# Patient Record
Sex: Female | Born: 1940
Health system: Southern US, Community
[De-identification: ages and names within clinical notes are randomized; demographics above are authoritative.]

## PROBLEM LIST (undated history)

## (undated) DIAGNOSIS — N3010 Interstitial cystitis (chronic) without hematuria: Secondary | ICD-10-CM

## (undated) DIAGNOSIS — D649 Anemia, unspecified: Secondary | ICD-10-CM

## (undated) DIAGNOSIS — F419 Anxiety disorder, unspecified: Secondary | ICD-10-CM

## (undated) DIAGNOSIS — M1990 Unspecified osteoarthritis, unspecified site: Secondary | ICD-10-CM

## (undated) DIAGNOSIS — K5792 Diverticulitis of intestine, part unspecified, without perforation or abscess without bleeding: Secondary | ICD-10-CM

## (undated) DIAGNOSIS — M8580 Other specified disorders of bone density and structure, unspecified site: Secondary | ICD-10-CM

## (undated) DIAGNOSIS — M545 Low back pain: Secondary | ICD-10-CM

## (undated) DIAGNOSIS — R112 Nausea with vomiting, unspecified: Secondary | ICD-10-CM

## (undated) DIAGNOSIS — N189 Chronic kidney disease, unspecified: Secondary | ICD-10-CM

## (undated) DIAGNOSIS — R319 Hematuria, unspecified: Secondary | ICD-10-CM

## (undated) DIAGNOSIS — M719 Bursopathy, unspecified: Secondary | ICD-10-CM

## (undated) DIAGNOSIS — I6529 Occlusion and stenosis of unspecified carotid artery: Secondary | ICD-10-CM

## (undated) DIAGNOSIS — G43109 Migraine with aura, not intractable, without status migrainosus: Secondary | ICD-10-CM

## (undated) DIAGNOSIS — I10 Essential (primary) hypertension: Secondary | ICD-10-CM

## (undated) DIAGNOSIS — Z9889 Other specified postprocedural states: Secondary | ICD-10-CM

## (undated) HISTORY — DX: Low back pain: M54.5

## (undated) HISTORY — DX: Chronic kidney disease, unspecified: N18.9

## (undated) HISTORY — DX: Diverticulitis of intestine, part unspecified, without perforation or abscess without bleeding: K57.92

## (undated) HISTORY — DX: Occlusion and stenosis of unspecified carotid artery: I65.29

## (undated) HISTORY — DX: Anemia, unspecified: D64.9

## (undated) HISTORY — DX: Migraine with aura, not intractable, without status migrainosus: G43.109

## (undated) HISTORY — DX: Essential (primary) hypertension: I10

## (undated) HISTORY — DX: Hematuria, unspecified: R31.9

## (undated) HISTORY — DX: Interstitial cystitis (chronic) without hematuria: N30.10

## (undated) HISTORY — DX: Bursopathy, unspecified: M71.9

## (undated) HISTORY — DX: Anxiety disorder, unspecified: F41.9

## (undated) HISTORY — DX: Unspecified osteoarthritis, unspecified site: M19.90

## (undated) HISTORY — DX: Other specified disorders of bone density and structure, unspecified site: M85.80

---
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
## 1998-03-09 ENCOUNTER — Ambulatory Visit (HOSPITAL_COMMUNITY): Admission: RE | Admit: 1998-03-09 | Discharge: 1998-03-09 | Payer: Self-pay | Admitting: Obstetrics and Gynecology

## 1998-08-17 ENCOUNTER — Other Ambulatory Visit: Admission: RE | Admit: 1998-08-17 | Discharge: 1998-08-17 | Payer: Self-pay | Admitting: Obstetrics and Gynecology

## 1999-04-29 ENCOUNTER — Ambulatory Visit (HOSPITAL_COMMUNITY): Admission: RE | Admit: 1999-04-29 | Discharge: 1999-04-29 | Payer: Self-pay | Admitting: Obstetrics and Gynecology

## 1999-04-29 ENCOUNTER — Encounter: Payer: Self-pay | Admitting: Obstetrics and Gynecology

## 1999-08-30 ENCOUNTER — Other Ambulatory Visit: Admission: RE | Admit: 1999-08-30 | Discharge: 1999-08-30 | Payer: Self-pay | Admitting: Obstetrics and Gynecology

## 2000-10-09 ENCOUNTER — Other Ambulatory Visit: Admission: RE | Admit: 2000-10-09 | Discharge: 2000-10-09 | Payer: Self-pay | Admitting: Obstetrics and Gynecology

## 2000-10-16 ENCOUNTER — Encounter: Payer: Self-pay | Admitting: Obstetrics and Gynecology

## 2000-10-16 ENCOUNTER — Ambulatory Visit (HOSPITAL_COMMUNITY): Admission: RE | Admit: 2000-10-16 | Discharge: 2000-10-16 | Payer: Self-pay | Admitting: Obstetrics and Gynecology

## 2001-04-30 ENCOUNTER — Encounter: Payer: Self-pay | Admitting: Obstetrics and Gynecology

## 2001-04-30 ENCOUNTER — Encounter: Admission: RE | Admit: 2001-04-30 | Discharge: 2001-04-30 | Payer: Self-pay | Admitting: Obstetrics and Gynecology

## 2001-10-15 ENCOUNTER — Other Ambulatory Visit: Admission: RE | Admit: 2001-10-15 | Discharge: 2001-10-15 | Payer: Self-pay | Admitting: Obstetrics and Gynecology

## 2001-10-18 ENCOUNTER — Other Ambulatory Visit: Admission: RE | Admit: 2001-10-18 | Discharge: 2001-10-18 | Payer: Self-pay | Admitting: Obstetrics and Gynecology

## 2001-11-24 ENCOUNTER — Ambulatory Visit (HOSPITAL_COMMUNITY): Admission: RE | Admit: 2001-11-24 | Discharge: 2001-11-24 | Payer: Self-pay | Admitting: Obstetrics and Gynecology

## 2001-11-24 ENCOUNTER — Encounter: Payer: Self-pay | Admitting: Obstetrics and Gynecology

## 2002-10-27 ENCOUNTER — Other Ambulatory Visit: Admission: RE | Admit: 2002-10-27 | Discharge: 2002-10-27 | Payer: Self-pay | Admitting: Obstetrics and Gynecology

## 2003-02-06 ENCOUNTER — Encounter: Payer: Self-pay | Admitting: Obstetrics and Gynecology

## 2003-02-06 ENCOUNTER — Ambulatory Visit (HOSPITAL_COMMUNITY): Admission: RE | Admit: 2003-02-06 | Discharge: 2003-02-06 | Payer: Self-pay | Admitting: Obstetrics and Gynecology

## 2003-07-19 ENCOUNTER — Encounter: Admission: RE | Admit: 2003-07-19 | Discharge: 2003-07-19 | Payer: Self-pay | Admitting: Obstetrics and Gynecology

## 2003-10-30 ENCOUNTER — Other Ambulatory Visit: Admission: RE | Admit: 2003-10-30 | Discharge: 2003-10-30 | Payer: Self-pay | Admitting: Obstetrics and Gynecology

## 2004-02-22 ENCOUNTER — Ambulatory Visit (HOSPITAL_COMMUNITY): Admission: RE | Admit: 2004-02-22 | Discharge: 2004-02-22 | Payer: Self-pay | Admitting: Obstetrics and Gynecology

## 2004-11-05 ENCOUNTER — Other Ambulatory Visit: Admission: RE | Admit: 2004-11-05 | Discharge: 2004-11-05 | Payer: Self-pay | Admitting: *Deleted

## 2005-03-12 ENCOUNTER — Ambulatory Visit (HOSPITAL_COMMUNITY): Admission: RE | Admit: 2005-03-12 | Discharge: 2005-03-12 | Payer: Self-pay | Admitting: Obstetrics and Gynecology

## 2005-03-14 ENCOUNTER — Encounter: Admission: RE | Admit: 2005-03-14 | Discharge: 2005-03-14 | Payer: Self-pay | Admitting: Family Medicine

## 2005-11-20 ENCOUNTER — Other Ambulatory Visit: Admission: RE | Admit: 2005-11-20 | Discharge: 2005-11-20 | Payer: Self-pay | Admitting: Obstetrics & Gynecology

## 2006-04-08 ENCOUNTER — Ambulatory Visit (HOSPITAL_COMMUNITY): Admission: RE | Admit: 2006-04-08 | Discharge: 2006-04-08 | Payer: Self-pay | Admitting: Obstetrics & Gynecology

## 2006-12-25 ENCOUNTER — Other Ambulatory Visit: Admission: RE | Admit: 2006-12-25 | Discharge: 2006-12-25 | Payer: Self-pay | Admitting: Obstetrics & Gynecology

## 2007-04-13 ENCOUNTER — Ambulatory Visit (HOSPITAL_COMMUNITY): Admission: RE | Admit: 2007-04-13 | Discharge: 2007-04-13 | Payer: Self-pay | Admitting: Obstetrics & Gynecology

## 2008-04-12 ENCOUNTER — Other Ambulatory Visit: Admission: RE | Admit: 2008-04-12 | Discharge: 2008-04-12 | Payer: Self-pay | Admitting: Obstetrics & Gynecology

## 2008-04-21 ENCOUNTER — Ambulatory Visit (HOSPITAL_COMMUNITY): Admission: RE | Admit: 2008-04-21 | Discharge: 2008-04-21 | Payer: Self-pay | Admitting: Obstetrics & Gynecology

## 2009-05-03 ENCOUNTER — Ambulatory Visit (HOSPITAL_COMMUNITY): Admission: RE | Admit: 2009-05-03 | Discharge: 2009-05-03 | Payer: Self-pay | Admitting: Obstetrics & Gynecology

## 2010-05-09 ENCOUNTER — Ambulatory Visit (HOSPITAL_COMMUNITY): Admission: RE | Admit: 2010-05-09 | Discharge: 2010-05-09 | Payer: Self-pay | Admitting: Obstetrics & Gynecology

## 2010-09-01 ENCOUNTER — Encounter: Payer: Self-pay | Admitting: Obstetrics & Gynecology

## 2011-05-23 ENCOUNTER — Other Ambulatory Visit: Payer: Self-pay | Admitting: Obstetrics & Gynecology

## 2011-05-23 DIAGNOSIS — Z1231 Encounter for screening mammogram for malignant neoplasm of breast: Secondary | ICD-10-CM

## 2011-06-12 ENCOUNTER — Ambulatory Visit (HOSPITAL_COMMUNITY)
Admission: RE | Admit: 2011-06-12 | Discharge: 2011-06-12 | Disposition: A | Discharge status: Home / Self Care | Payer: Medicare Other | Source: Ambulatory Visit | Attending: Obstetrics & Gynecology | Admitting: Obstetrics & Gynecology

## 2011-06-12 DIAGNOSIS — Z1231 Encounter for screening mammogram for malignant neoplasm of breast: Secondary | ICD-10-CM | POA: Insufficient documentation

## 2011-11-05 DIAGNOSIS — F411 Generalized anxiety disorder: Secondary | ICD-10-CM | POA: Diagnosis not present

## 2011-11-05 DIAGNOSIS — J309 Allergic rhinitis, unspecified: Secondary | ICD-10-CM | POA: Diagnosis not present

## 2011-11-05 DIAGNOSIS — I10 Essential (primary) hypertension: Secondary | ICD-10-CM | POA: Diagnosis not present

## 2011-11-05 DIAGNOSIS — M159 Polyosteoarthritis, unspecified: Secondary | ICD-10-CM | POA: Diagnosis not present

## 2011-11-05 DIAGNOSIS — M899 Disorder of bone, unspecified: Secondary | ICD-10-CM | POA: Diagnosis not present

## 2011-11-05 DIAGNOSIS — M949 Disorder of cartilage, unspecified: Secondary | ICD-10-CM | POA: Diagnosis not present

## 2012-01-28 DIAGNOSIS — H40059 Ocular hypertension, unspecified eye: Secondary | ICD-10-CM | POA: Diagnosis not present

## 2012-03-17 DIAGNOSIS — B079 Viral wart, unspecified: Secondary | ICD-10-CM | POA: Diagnosis not present

## 2012-04-30 DIAGNOSIS — H9209 Otalgia, unspecified ear: Secondary | ICD-10-CM | POA: Diagnosis not present

## 2012-05-07 DIAGNOSIS — F411 Generalized anxiety disorder: Secondary | ICD-10-CM | POA: Diagnosis not present

## 2012-05-07 DIAGNOSIS — M899 Disorder of bone, unspecified: Secondary | ICD-10-CM | POA: Diagnosis not present

## 2012-05-07 DIAGNOSIS — Z23 Encounter for immunization: Secondary | ICD-10-CM | POA: Diagnosis not present

## 2012-05-07 DIAGNOSIS — M159 Polyosteoarthritis, unspecified: Secondary | ICD-10-CM | POA: Diagnosis not present

## 2012-05-07 DIAGNOSIS — I10 Essential (primary) hypertension: Secondary | ICD-10-CM | POA: Diagnosis not present

## 2012-05-07 DIAGNOSIS — J309 Allergic rhinitis, unspecified: Secondary | ICD-10-CM | POA: Diagnosis not present

## 2012-05-07 DIAGNOSIS — M949 Disorder of cartilage, unspecified: Secondary | ICD-10-CM | POA: Diagnosis not present

## 2012-05-07 DIAGNOSIS — R000 Tachycardia, unspecified: Secondary | ICD-10-CM | POA: Diagnosis not present

## 2012-05-07 DIAGNOSIS — E559 Vitamin D deficiency, unspecified: Secondary | ICD-10-CM | POA: Diagnosis not present

## 2012-05-07 DIAGNOSIS — Z1331 Encounter for screening for depression: Secondary | ICD-10-CM | POA: Diagnosis not present

## 2012-06-29 ENCOUNTER — Other Ambulatory Visit: Payer: Self-pay | Admitting: Obstetrics & Gynecology

## 2012-06-29 DIAGNOSIS — Z1231 Encounter for screening mammogram for malignant neoplasm of breast: Secondary | ICD-10-CM

## 2012-06-29 DIAGNOSIS — M8580 Other specified disorders of bone density and structure, unspecified site: Secondary | ICD-10-CM

## 2012-07-14 DIAGNOSIS — Z124 Encounter for screening for malignant neoplasm of cervix: Secondary | ICD-10-CM | POA: Diagnosis not present

## 2012-07-14 DIAGNOSIS — Z01419 Encounter for gynecological examination (general) (routine) without abnormal findings: Secondary | ICD-10-CM | POA: Diagnosis not present

## 2012-07-21 ENCOUNTER — Ambulatory Visit (HOSPITAL_COMMUNITY)
Admission: RE | Admit: 2012-07-21 | Discharge: 2012-07-21 | Disposition: A | Discharge status: Home / Self Care | Payer: Medicare Other | Source: Ambulatory Visit | Attending: Obstetrics & Gynecology | Admitting: Obstetrics & Gynecology

## 2012-07-21 DIAGNOSIS — Z13820 Encounter for screening for osteoporosis: Secondary | ICD-10-CM | POA: Diagnosis not present

## 2012-07-21 DIAGNOSIS — Z1231 Encounter for screening mammogram for malignant neoplasm of breast: Secondary | ICD-10-CM | POA: Insufficient documentation

## 2012-07-21 DIAGNOSIS — M899 Disorder of bone, unspecified: Secondary | ICD-10-CM | POA: Diagnosis not present

## 2012-07-21 DIAGNOSIS — Z780 Asymptomatic menopausal state: Secondary | ICD-10-CM | POA: Diagnosis not present

## 2012-07-21 DIAGNOSIS — M949 Disorder of cartilage, unspecified: Secondary | ICD-10-CM | POA: Diagnosis not present

## 2012-07-21 DIAGNOSIS — M8580 Other specified disorders of bone density and structure, unspecified site: Secondary | ICD-10-CM

## 2012-08-13 DIAGNOSIS — M899 Disorder of bone, unspecified: Secondary | ICD-10-CM | POA: Diagnosis not present

## 2012-11-04 DIAGNOSIS — J309 Allergic rhinitis, unspecified: Secondary | ICD-10-CM | POA: Diagnosis not present

## 2012-11-04 DIAGNOSIS — M159 Polyosteoarthritis, unspecified: Secondary | ICD-10-CM | POA: Diagnosis not present

## 2012-11-04 DIAGNOSIS — R7301 Impaired fasting glucose: Secondary | ICD-10-CM | POA: Diagnosis not present

## 2012-11-04 DIAGNOSIS — I10 Essential (primary) hypertension: Secondary | ICD-10-CM | POA: Diagnosis not present

## 2012-11-04 DIAGNOSIS — R0989 Other specified symptoms and signs involving the circulatory and respiratory systems: Secondary | ICD-10-CM | POA: Diagnosis not present

## 2012-11-04 DIAGNOSIS — F411 Generalized anxiety disorder: Secondary | ICD-10-CM | POA: Diagnosis not present

## 2012-11-04 DIAGNOSIS — E559 Vitamin D deficiency, unspecified: Secondary | ICD-10-CM | POA: Diagnosis not present

## 2012-11-04 DIAGNOSIS — M899 Disorder of bone, unspecified: Secondary | ICD-10-CM | POA: Diagnosis not present

## 2012-11-04 DIAGNOSIS — M949 Disorder of cartilage, unspecified: Secondary | ICD-10-CM | POA: Diagnosis not present

## 2012-11-05 DIAGNOSIS — R0989 Other specified symptoms and signs involving the circulatory and respiratory systems: Secondary | ICD-10-CM | POA: Diagnosis not present

## 2012-11-09 DIAGNOSIS — I6529 Occlusion and stenosis of unspecified carotid artery: Secondary | ICD-10-CM

## 2012-11-09 HISTORY — DX: Occlusion and stenosis of unspecified carotid artery: I65.29

## 2012-11-11 ENCOUNTER — Other Ambulatory Visit: Payer: Self-pay

## 2012-11-24 ENCOUNTER — Encounter: Payer: Self-pay | Admitting: Vascular Surgery

## 2012-11-29 ENCOUNTER — Encounter: Payer: Self-pay | Admitting: Vascular Surgery

## 2012-11-30 ENCOUNTER — Other Ambulatory Visit (INDEPENDENT_AMBULATORY_CARE_PROVIDER_SITE_OTHER): Payer: BC Managed Care – PPO | Admitting: *Deleted

## 2012-11-30 ENCOUNTER — Encounter: Payer: Self-pay | Admitting: Vascular Surgery

## 2012-11-30 ENCOUNTER — Ambulatory Visit (INDEPENDENT_AMBULATORY_CARE_PROVIDER_SITE_OTHER): Payer: BC Managed Care – PPO | Admitting: Vascular Surgery

## 2012-11-30 ENCOUNTER — Other Ambulatory Visit: Payer: Self-pay | Admitting: *Deleted

## 2012-11-30 DIAGNOSIS — I6529 Occlusion and stenosis of unspecified carotid artery: Secondary | ICD-10-CM

## 2012-11-30 MED ORDER — ATORVASTATIN CALCIUM 10 MG PO TABS: 10.0000 mg | ORAL_TABLET | Freq: Every day | ORAL | Status: DC

## 2012-11-30 NOTE — Progress Notes (Signed)
Subjective:     Patient ID: Amber Barry, female   DOB: 12/06/1940, 71 y.o.   MRN: [id number]  HPI this 71-year-old female was referred by Dr. Robert Ehinger for evaluation of severe right ICA stenosis. Patient denies any history of stroke. She denies any lateralizing weakness, amaurosis fugax, aphasia, diplopia, blurred vision, or syncope. Patient was found to have an 80% right ICA stenosis after a carotid bruit was heard on physical exam. He does not take daily aspirin or antilipid drugs.  Past Medical History  Diagnosis Date  . Carotid artery occlusion April 2014    Left Bruit  . Hypertension   . Arthritis     Osteoarthritis  . Anxiety   . Osteopenia   . Anemia   . Interstitial cystitis   . CKD (chronic kidney disease)     Stage III    History  Substance Use Topics  . Smoking status: Never Smoker   . Smokeless tobacco: Never Used  . Alcohol Use: No    Family History  Problem Relation Age of Onset  . COPD Mother     Respiratory Disease  . Stroke Father   . Hypertension Father     Allergies  Allergen Reactions  . Benicar Hct (Olmesartan Medoxomil-Hctz) Swelling    Facial swelling  . Buspar (Buspirone)     Facial Dysesthesia  . Norvasc (Amlodipine Besylate) Swelling  . Zoloft (Sertraline Hcl) Anxiety    Increased anxiety  . Amitriptyline Hcl Anxiety    And Paresthesias    Current outpatient prescriptions:ALPRAZolam (XANAX) 0.25 MG tablet, Take 0.25 mg by mouth at bedtime as needed for sleep., Disp: , Rfl: ;  calcium carbonate (OS-CAL) 600 MG TABS, Take 600 mg by mouth 2 (two) times daily with a meal., Disp: , Rfl: ;  celecoxib (CELEBREX) 200 MG capsule, Take 200 mg by mouth 2 (two) times daily., Disp: , Rfl: ;  Cholecalciferol (VITAMIN D) 2000 UNITS CAPS, Take by mouth., Disp: , Rfl:  moexipril-hydrochlorothiazide (UNIRETIC) 15-25 MG per tablet, Take 1 tablet by mouth daily., Disp: , Rfl: ;  pseudoephedrine (SUDAFED) 30 MG tablet, Take 30 mg by mouth every 4 (four)  hours as needed for congestion., Disp: , Rfl: ;  atorvastatin (LIPITOR) 10 MG tablet, Take 1 tablet (10 mg total) by mouth daily., Disp: 30 tablet, Rfl: 0;  Azelastine HCl (ASTEPRO) 0.15 % SOLN, Place 2 sprays into the nose., Disp: , Rfl:   BP 157/84  Pulse 84  Resp 16  Ht 4' 11" (1.499 m)  Wt 125 lb (56.7 kg)  BMI 25.23 kg/m2  Body mass index is 25.23 kg/(m^2).          Review of Systems denies chest pain, dyspnea on exertion, PND, orthopnea, hemoptysis, claudication.    Objective:   Physical Exam blood pressure 157/84 heart rate 84 respirations 16 Gen.-alert and oriented x3 in no apparent distress HEENT normal for age Lungs no rhonchi or wheezing Cardiovascular regular rhythm no murmurs carotid pulses 3+ palpable , right carotid bruit  Abdomen soft nontender no palpable masses Musculoskeletal free of  major deformities Skin clear -no rashes Neurologic normal Lower extremities 3+ femoral and dorsalis pedis pulses palpable bilaterally with no edema  Today I ordered a carotid duplex exam which I reviewed and compared to previous study. I agreed she does have an 80% right ICA stenosis. The left internal carotid has minimal stenosis.      Assessment:     80% right ICA stenosis-asymptomatic Discussed this with patient   and her daughter and son-in-law including risks and benefits of surgery Also discussed the need to begin antilipid drugs-Lipitor    Plan:     #1 right carotid endarterectomy will be scheduled in the near future Patient will call and arrange this sometime in the near future Suggested that she start daily aspirin 81 mg      

## 2012-12-01 ENCOUNTER — Encounter: Payer: Self-pay | Admitting: *Deleted

## 2012-12-01 ENCOUNTER — Other Ambulatory Visit: Payer: Self-pay | Admitting: *Deleted

## 2012-12-07 ENCOUNTER — Encounter (HOSPITAL_COMMUNITY): Payer: Self-pay | Admitting: Pharmacist

## 2012-12-10 ENCOUNTER — Encounter (HOSPITAL_COMMUNITY): Payer: Self-pay

## 2012-12-10 ENCOUNTER — Encounter (HOSPITAL_COMMUNITY)
Admission: RE | Admit: 2012-12-10 | Discharge: 2012-12-10 | Disposition: A | Discharge status: Home / Self Care | Payer: Medicare Other | Source: Ambulatory Visit | Attending: Anesthesiology | Admitting: Anesthesiology

## 2012-12-10 ENCOUNTER — Encounter (HOSPITAL_COMMUNITY)
Admission: RE | Admit: 2012-12-10 | Discharge: 2012-12-10 | Disposition: A | Discharge status: Home / Self Care | Payer: Medicare Other | Source: Ambulatory Visit | Attending: Vascular Surgery | Admitting: Vascular Surgery

## 2012-12-10 DIAGNOSIS — N183 Chronic kidney disease, stage 3 unspecified: Secondary | ICD-10-CM | POA: Insufficient documentation

## 2012-12-10 DIAGNOSIS — I10 Essential (primary) hypertension: Secondary | ICD-10-CM | POA: Diagnosis not present

## 2012-12-10 DIAGNOSIS — F411 Generalized anxiety disorder: Secondary | ICD-10-CM | POA: Insufficient documentation

## 2012-12-10 DIAGNOSIS — D649 Anemia, unspecified: Secondary | ICD-10-CM | POA: Diagnosis not present

## 2012-12-10 DIAGNOSIS — Z01810 Encounter for preprocedural cardiovascular examination: Secondary | ICD-10-CM | POA: Insufficient documentation

## 2012-12-10 DIAGNOSIS — I129 Hypertensive chronic kidney disease with stage 1 through stage 4 chronic kidney disease, or unspecified chronic kidney disease: Secondary | ICD-10-CM | POA: Insufficient documentation

## 2012-12-10 DIAGNOSIS — Z01812 Encounter for preprocedural laboratory examination: Secondary | ICD-10-CM | POA: Insufficient documentation

## 2012-12-10 DIAGNOSIS — I4510 Unspecified right bundle-branch block: Secondary | ICD-10-CM | POA: Insufficient documentation

## 2012-12-10 DIAGNOSIS — M129 Arthropathy, unspecified: Secondary | ICD-10-CM | POA: Insufficient documentation

## 2012-12-10 DIAGNOSIS — Z01818 Encounter for other preprocedural examination: Secondary | ICD-10-CM | POA: Insufficient documentation

## 2012-12-10 HISTORY — DX: Nausea with vomiting, unspecified: R11.2

## 2012-12-10 HISTORY — DX: Other specified postprocedural states: Z98.890

## 2012-12-10 LAB — URINALYSIS, ROUTINE W REFLEX MICROSCOPIC
Bilirubin Urine: NEGATIVE
Glucose, UA: NEGATIVE mg/dL
Ketones, ur: NEGATIVE mg/dL
Protein, ur: NEGATIVE mg/dL

## 2012-12-10 LAB — CBC
MCH: 29.7 pg (ref 26.0–34.0)
MCHC: 34.6 g/dL (ref 30.0–36.0)
Platelets: 257 10*3/uL (ref 150–400)
RBC: 4.37 MIL/uL (ref 3.87–5.11)

## 2012-12-10 LAB — PREPARE RBC (CROSSMATCH)

## 2012-12-10 LAB — COMPREHENSIVE METABOLIC PANEL
ALT: 12 U/L (ref 0–35)
AST: 19 U/L (ref 0–37)
Albumin: 4.1 g/dL (ref 3.5–5.2)
Calcium: 10.2 mg/dL (ref 8.4–10.5)
Sodium: 137 mEq/L (ref 135–145)
Total Protein: 8.1 g/dL (ref 6.0–8.3)

## 2012-12-10 LAB — URINE MICROSCOPIC-ADD ON

## 2012-12-10 LAB — SURGICAL PCR SCREEN: Staphylococcus aureus: NEGATIVE

## 2012-12-10 NOTE — Pre-Procedure Instructions (Signed)
Amber Barry  12/10/2012   Your procedure is scheduled on:  12-22-2012  Wednesday   Report to [facility] Stay Center at 6:30 AM.  Call this number if you have problems the morning of surgery: [phone]   Remember:   Do not eat food or drink liquids after midnight.    Take these medicines the morning of surgery with A SIP OF WATER: xanax if needed,aspirin,        Do not wear jewelry, make-up or nail polish.  Do not wear lotions, powders, or perfumes. You may wear deodorant.  Do not shave 48 hours prior to surgery.   Do not bring valuables to the hospital.  Contacts, dentures or bridgework may not be worn into surgery.  Leave suitcase in the car. After surgery it may be brought to your room.   For patients admitted to the hospital, checkout time is 11:00 AM the day of discharge.   Patients discharged the day of surgery will not be allowed to drive home.     Special Instructions: Shower using CHG 2 nights before surgery and the night before surgery.  If you shower the day of surgery use CHG.  Use special wash - you have one bottle of CHG for all showers.  You should use approximately 1/3 of the bottle for each shower.   Please read over the following fact sheets that you were given: Pain Booklet, Coughing and Deep Breathing, Blood Transfusion Information and Surgical Site Infection Prevention

## 2012-12-13 NOTE — Progress Notes (Signed)
Anesthesia Chart Review:  Patient is a 72 year old female scheduled for right CEA by Dr. Hart Rochester on 12/22/12.  Recent carotid duplex showed 80% RICAS and minimal stenosis on the left.  History includes non-smoker, HTN, CKD stage III, anemia, arthritis, anxiety, post-operative N/V.  PCP is Dr. Blair Heys.    EKG on 12/10/12 showed NSR, right BBB.  CXR on 12/10/12 showed no acute cardiopulmonary process.  Preoperative labs noted. Cr 1.11.    Anticipate she can proceed as planned.  She will be evaluated by her assigned anesthesiologist on the day of surgery.  Velna Ochs [facility] Short Stay Center/Anesthesiology Phone [phone] 12/13/2012 2:52 PM

## 2012-12-21 MED ORDER — DEXTROSE 5 % IV SOLN
1.5000 g | INTRAVENOUS | Status: AC
  Administered 2012-12-22: 1.5 g via INTRAVENOUS
  Filled 2012-12-21: qty 1.5

## 2012-12-22 ENCOUNTER — Inpatient Hospital Stay (HOSPITAL_COMMUNITY): Payer: Medicare Other | Admitting: Anesthesiology

## 2012-12-22 ENCOUNTER — Encounter (HOSPITAL_COMMUNITY): Payer: Self-pay | Admitting: Vascular Surgery

## 2012-12-22 ENCOUNTER — Encounter (HOSPITAL_COMMUNITY)
Admission: RE | Disposition: A | Discharge status: Home / Self Care | Payer: Self-pay | Source: Ambulatory Visit | Attending: Vascular Surgery

## 2012-12-22 ENCOUNTER — Encounter (HOSPITAL_COMMUNITY): Payer: Self-pay | Admitting: Anesthesiology

## 2012-12-22 ENCOUNTER — Inpatient Hospital Stay (HOSPITAL_COMMUNITY)
Admission: RE | Admit: 2012-12-22 | Discharge: 2012-12-23 | DRG: 038 | Disposition: A | Discharge status: Home / Self Care | Payer: Medicare Other | Source: Ambulatory Visit | Attending: Vascular Surgery | Admitting: Vascular Surgery

## 2012-12-22 DIAGNOSIS — F411 Generalized anxiety disorder: Secondary | ICD-10-CM | POA: Diagnosis present

## 2012-12-22 DIAGNOSIS — N183 Chronic kidney disease, stage 3 unspecified: Secondary | ICD-10-CM | POA: Diagnosis not present

## 2012-12-22 DIAGNOSIS — I6529 Occlusion and stenosis of unspecified carotid artery: Secondary | ICD-10-CM | POA: Diagnosis not present

## 2012-12-22 DIAGNOSIS — M1990 Unspecified osteoarthritis, unspecified site: Secondary | ICD-10-CM | POA: Diagnosis present

## 2012-12-22 DIAGNOSIS — D62 Acute posthemorrhagic anemia: Secondary | ICD-10-CM | POA: Diagnosis not present

## 2012-12-22 DIAGNOSIS — I129 Hypertensive chronic kidney disease with stage 1 through stage 4 chronic kidney disease, or unspecified chronic kidney disease: Secondary | ICD-10-CM | POA: Diagnosis present

## 2012-12-22 DIAGNOSIS — N189 Chronic kidney disease, unspecified: Secondary | ICD-10-CM | POA: Diagnosis not present

## 2012-12-22 DIAGNOSIS — I10 Essential (primary) hypertension: Secondary | ICD-10-CM | POA: Diagnosis not present

## 2012-12-22 HISTORY — PX: ENDARTERECTOMY: SHX5162

## 2012-12-22 LAB — CBC
Hemoglobin: 11.1 g/dL — ABNORMAL LOW (ref 12.0–15.0)
MCH: 29.7 pg (ref 26.0–34.0)
MCHC: 34 g/dL (ref 30.0–36.0)
Platelets: 176 10*3/uL (ref 150–400)
RBC: 3.74 MIL/uL — ABNORMAL LOW (ref 3.87–5.11)

## 2012-12-22 LAB — CREATININE, SERUM
Creatinine, Ser: 1.04 mg/dL (ref 0.50–1.10)
GFR calc non Af Amer: 52 mL/min — ABNORMAL LOW (ref >90)

## 2012-12-22 SURGERY — ENDARTERECTOMY, CAROTID
Anesthesia: General | Site: Neck | Laterality: Right | Wound class: Clean

## 2012-12-22 MED ORDER — VITAMIN D3 25 MCG (1000 UNIT) PO TABS
2000.0000 [IU] | ORAL_TABLET | Freq: Every day | ORAL | Status: DC
  Administered 2012-12-23: 2000 [IU] via ORAL
  Filled 2012-12-22: qty 2

## 2012-12-22 MED ORDER — HEPARIN SODIUM (PORCINE) 1000 UNIT/ML IJ SOLN
INTRAMUSCULAR | Status: DC | PRN
  Administered 2012-12-22: 6000 [IU] via INTRAVENOUS

## 2012-12-22 MED ORDER — PROTAMINE SULFATE 10 MG/ML IV SOLN
INTRAVENOUS | Status: DC | PRN
  Administered 2012-12-22: 10 mg via INTRAVENOUS

## 2012-12-22 MED ORDER — SODIUM CHLORIDE 0.9 % IV SOLN
INTRAVENOUS | Status: DC
  Administered 2012-12-22 (×2): via INTRAVENOUS

## 2012-12-22 MED ORDER — FENTANYL CITRATE 0.05 MG/ML IJ SOLN
INTRAMUSCULAR | Status: DC | PRN
  Administered 2012-12-22: 150 ug via INTRAVENOUS
  Administered 2012-12-22: 50 ug via INTRAVENOUS

## 2012-12-22 MED ORDER — MOEXIPRIL-HYDROCHLOROTHIAZIDE 15-25 MG PO TABS: 1.0000 | ORAL_TABLET | Freq: Every morning | ORAL | Status: DC

## 2012-12-22 MED ORDER — LABETALOL HCL 5 MG/ML IV SOLN: 10.0000 mg | INTRAVENOUS | Status: DC | PRN

## 2012-12-22 MED ORDER — SODIUM CHLORIDE 0.9 % IV SOLN: 500.0000 mL | Freq: Once | INTRAVENOUS | Status: AC | PRN

## 2012-12-22 MED ORDER — ROCURONIUM BROMIDE 100 MG/10ML IV SOLN
INTRAVENOUS | Status: DC | PRN
  Administered 2012-12-22: 40 mg via INTRAVENOUS

## 2012-12-22 MED ORDER — HYDROCHLOROTHIAZIDE 25 MG PO TABS
25.0000 mg | ORAL_TABLET | Freq: Every day | ORAL | Status: DC
  Administered 2012-12-23: 25 mg via ORAL
  Filled 2012-12-22: qty 1

## 2012-12-22 MED ORDER — ATORVASTATIN CALCIUM 10 MG PO TABS
10.0000 mg | ORAL_TABLET | Freq: Every day | ORAL | Status: DC
  Administered 2012-12-22: 10 mg via ORAL
  Filled 2012-12-22 (×2): qty 1

## 2012-12-22 MED ORDER — METOPROLOL TARTRATE 1 MG/ML IV SOLN: 2.0000 mg | INTRAVENOUS | Status: DC | PRN

## 2012-12-22 MED ORDER — HYDROMORPHONE HCL PF 1 MG/ML IJ SOLN
INTRAMUSCULAR | Status: AC
  Filled 2012-12-22: qty 1

## 2012-12-22 MED ORDER — VITAMIN D 50 MCG (2000 UT) PO CAPS: 2000.0000 [IU] | ORAL_CAPSULE | Freq: Every day | ORAL | Status: DC

## 2012-12-22 MED ORDER — PANTOPRAZOLE SODIUM 40 MG PO TBEC
40.0000 mg | DELAYED_RELEASE_TABLET | Freq: Every day | ORAL | Status: DC
  Administered 2012-12-22 – 2012-12-23 (×2): 40 mg via ORAL
  Filled 2012-12-22 (×2): qty 1

## 2012-12-22 MED ORDER — MOEXIPRIL HCL 15 MG PO TABS
15.0000 mg | ORAL_TABLET | Freq: Every day | ORAL | Status: DC
  Filled 2012-12-22: qty 1

## 2012-12-22 MED ORDER — POTASSIUM CHLORIDE CRYS ER 20 MEQ PO TBCR: 20.0000 meq | EXTENDED_RELEASE_TABLET | Freq: Once | ORAL | Status: AC | PRN

## 2012-12-22 MED ORDER — PHENOL 1.4 % MT LIQD: 1.0000 | OROMUCOSAL | Status: DC | PRN

## 2012-12-22 MED ORDER — ESMOLOL HCL 10 MG/ML IV SOLN
INTRAVENOUS | Status: DC | PRN
  Administered 2012-12-22: 30 mg via INTRAVENOUS

## 2012-12-22 MED ORDER — ALUM & MAG HYDROXIDE-SIMETH 200-200-20 MG/5ML PO SUSP: 15.0000 mL | ORAL | Status: DC | PRN

## 2012-12-22 MED ORDER — DEXTROSE 5 % IV SOLN
1.5000 g | Freq: Two times a day (BID) | INTRAVENOUS | Status: AC
  Administered 2012-12-22 – 2012-12-23 (×2): 1.5 g via INTRAVENOUS
  Filled 2012-12-22 (×4): qty 1.5

## 2012-12-22 MED ORDER — OXYCODONE HCL 5 MG PO TABS
5.0000 mg | ORAL_TABLET | ORAL | Status: DC | PRN
  Administered 2012-12-22 – 2012-12-23 (×2): 5 mg via ORAL
  Filled 2012-12-22 (×2): qty 1

## 2012-12-22 MED ORDER — ASPIRIN EC 81 MG PO TBEC: 81.0000 mg | DELAYED_RELEASE_TABLET | Freq: Every day | ORAL | Status: DC

## 2012-12-22 MED ORDER — 0.9 % SODIUM CHLORIDE (POUR BTL) OPTIME
TOPICAL | Status: DC | PRN
  Administered 2012-12-22: 2000 mL

## 2012-12-22 MED ORDER — DOCUSATE SODIUM 100 MG PO CAPS: 100.0000 mg | ORAL_CAPSULE | Freq: Every day | ORAL | Status: DC

## 2012-12-22 MED ORDER — OXYCODONE HCL 5 MG/5ML PO SOLN: 5.0000 mg | Freq: Once | ORAL | Status: DC | PRN

## 2012-12-22 MED ORDER — PROPOFOL 10 MG/ML IV BOLUS
INTRAVENOUS | Status: DC | PRN
  Administered 2012-12-22: 180 mg via INTRAVENOUS

## 2012-12-22 MED ORDER — SODIUM CHLORIDE 0.9 % IR SOLN
Status: DC | PRN
  Administered 2012-12-22: 09:00:00

## 2012-12-22 MED ORDER — ONDANSETRON HCL 4 MG/2ML IJ SOLN
INTRAMUSCULAR | Status: DC | PRN
  Administered 2012-12-22: 4 mg via INTRAVENOUS

## 2012-12-22 MED ORDER — ALPRAZOLAM 0.25 MG PO TABS: 0.1250 mg | ORAL_TABLET | Freq: Two times a day (BID) | ORAL | Status: DC | PRN

## 2012-12-22 MED ORDER — PHENYLEPHRINE HCL 10 MG/ML IJ SOLN
10.0000 mg | INTRAMUSCULAR | Status: DC | PRN
  Administered 2012-12-22: 50 ug/min via INTRAVENOUS

## 2012-12-22 MED ORDER — BISACODYL 10 MG RE SUPP: 10.0000 mg | Freq: Every day | RECTAL | Status: DC | PRN

## 2012-12-22 MED ORDER — ENOXAPARIN SODIUM 30 MG/0.3ML ~~LOC~~ SOLN
30.0000 mg | SUBCUTANEOUS | Status: DC
  Filled 2012-12-22 (×2): qty 0.3

## 2012-12-22 MED ORDER — MORPHINE SULFATE 2 MG/ML IJ SOLN
2.0000 mg | INTRAMUSCULAR | Status: DC | PRN
  Administered 2012-12-22 – 2012-12-23 (×3): 2 mg via INTRAVENOUS
  Filled 2012-12-22 (×3): qty 1

## 2012-12-22 MED ORDER — OXYCODONE HCL 5 MG PO TABS: 5.0000 mg | ORAL_TABLET | Freq: Once | ORAL | Status: DC | PRN

## 2012-12-22 MED ORDER — ONDANSETRON HCL 4 MG/2ML IJ SOLN: 4.0000 mg | Freq: Four times a day (QID) | INTRAMUSCULAR | Status: DC | PRN

## 2012-12-22 MED ORDER — HYDROMORPHONE HCL PF 1 MG/ML IJ SOLN
0.2500 mg | INTRAMUSCULAR | Status: DC | PRN
  Administered 2012-12-22: 0.25 mg via INTRAVENOUS

## 2012-12-22 MED ORDER — ARTIFICIAL TEARS OP OINT
TOPICAL_OINTMENT | OPHTHALMIC | Status: DC | PRN
  Administered 2012-12-22: 1 via OPHTHALMIC

## 2012-12-22 MED ORDER — SENNOSIDES-DOCUSATE SODIUM 8.6-50 MG PO TABS
1.0000 | ORAL_TABLET | Freq: Every evening | ORAL | Status: DC | PRN
  Administered 2012-12-23: 1 via ORAL
  Filled 2012-12-22: qty 1

## 2012-12-22 MED ORDER — MAGNESIUM SULFATE 40 MG/ML IJ SOLN
2.0000 g | Freq: Once | INTRAMUSCULAR | Status: AC | PRN
  Filled 2012-12-22: qty 50

## 2012-12-22 MED ORDER — LACTATED RINGERS IV SOLN
INTRAVENOUS | Status: DC | PRN
  Administered 2012-12-22: 08:00:00 via INTRAVENOUS

## 2012-12-22 MED ORDER — ASPIRIN EC 325 MG PO TBEC
325.0000 mg | DELAYED_RELEASE_TABLET | Freq: Every day | ORAL | Status: DC
  Administered 2012-12-23: 325 mg via ORAL
  Filled 2012-12-22: qty 1

## 2012-12-22 MED ORDER — DOPAMINE-DEXTROSE 3.2-5 MG/ML-% IV SOLN: 3.0000 ug/kg/min | INTRAVENOUS | Status: DC

## 2012-12-22 MED ORDER — PROMETHAZINE HCL 25 MG/ML IJ SOLN: 6.2500 mg | INTRAMUSCULAR | Status: DC | PRN

## 2012-12-22 MED ORDER — GUAIFENESIN-DM 100-10 MG/5ML PO SYRP: 15.0000 mL | ORAL_SOLUTION | ORAL | Status: DC | PRN

## 2012-12-22 MED ORDER — HYDRALAZINE HCL 20 MG/ML IJ SOLN: 10.0000 mg | INTRAMUSCULAR | Status: DC | PRN

## 2012-12-22 MED ORDER — LIDOCAINE HCL 4 % MT SOLN
OROMUCOSAL | Status: DC | PRN
  Administered 2012-12-22: 4 mL via TOPICAL

## 2012-12-22 SURGICAL SUPPLY — 46 items
ADH SKN CLS LQ APL DERMABOND (GAUZE/BANDAGES/DRESSINGS) ×1
CANISTER SUCTION 2500CC (MISCELLANEOUS) ×2 IMPLANT
CATH ROBINSON RED A/P 18FR (CATHETERS) ×2 IMPLANT
CATH SUCT 10FR WHISTLE TIP (CATHETERS) ×2 IMPLANT
CLIP TI MEDIUM 24 (CLIP) ×2 IMPLANT
CLIP TI WIDE RED SMALL 24 (CLIP) ×2 IMPLANT
CLOTH BEACON ORANGE TIMEOUT ST (SAFETY) ×2 IMPLANT
COVER SURGICAL LIGHT HANDLE (MISCELLANEOUS) ×2 IMPLANT
CRADLE DONUT ADULT HEAD (MISCELLANEOUS) ×2 IMPLANT
DECANTER SPIKE VIAL GLASS SM (MISCELLANEOUS) IMPLANT
DERMABOND ADHESIVE PROPEN (GAUZE/BANDAGES/DRESSINGS) ×1
DERMABOND ADVANCED .7 DNX6 (GAUZE/BANDAGES/DRESSINGS) IMPLANT
DRAIN HEMOVAC 1/8 X 5 (WOUND CARE) IMPLANT
DRAPE WARM FLUID 44X44 (DRAPE) ×2 IMPLANT
DRSG COVADERM 4X8 (GAUZE/BANDAGES/DRESSINGS) ×1 IMPLANT
ELECT REM PT RETURN 9FT ADLT (ELECTROSURGICAL) ×2
ELECTRODE REM PT RTRN 9FT ADLT (ELECTROSURGICAL) ×1 IMPLANT
EVACUATOR SILICONE 100CC (DRAIN) IMPLANT
GLOVE BIO SURGEON STRL SZ 6 (GLOVE) ×2 IMPLANT
GLOVE BIOGEL PI IND STRL 6.5 (GLOVE) IMPLANT
GLOVE BIOGEL PI INDICATOR 6.5 (GLOVE) ×3
GLOVE SS BIOGEL STRL SZ 7 (GLOVE) ×1 IMPLANT
GLOVE SUPERSENSE BIOGEL SZ 7 (GLOVE) ×2
GOWN STRL NON-REIN LRG LVL3 (GOWN DISPOSABLE) ×5 IMPLANT
INSERT FOGARTY SM (MISCELLANEOUS) ×2 IMPLANT
KIT BASIN OR (CUSTOM PROCEDURE TRAY) ×2 IMPLANT
KIT ROOM TURNOVER OR (KITS) ×2 IMPLANT
NEEDLE 22X1 1/2 (OR ONLY) (NEEDLE) IMPLANT
NS IRRIG 1000ML POUR BTL (IV SOLUTION) ×4 IMPLANT
PACK CAROTID (CUSTOM PROCEDURE TRAY) ×2 IMPLANT
PAD ARMBOARD 7.5X6 YLW CONV (MISCELLANEOUS) ×4 IMPLANT
PATCH HEMASHIELD 8X75 (Vascular Products) ×1 IMPLANT
SHUNT CAROTID BYPASS 12FRX15.5 (VASCULAR PRODUCTS) IMPLANT
SPECIMEN JAR SMALL (MISCELLANEOUS) ×2 IMPLANT
SUT PROLENE 6 0 CC (SUTURE) ×2 IMPLANT
SUT PROLENE 7 0 BV 1 (SUTURE) ×1 IMPLANT
SUT SILK 2 0 FS (SUTURE) ×2 IMPLANT
SUT SILK 3 0 TIES 17X18 (SUTURE)
SUT SILK 3-0 18XBRD TIE BLK (SUTURE) IMPLANT
SUT VIC AB 2-0 CT1 27 (SUTURE) ×2
SUT VIC AB 2-0 CT1 TAPERPNT 27 (SUTURE) ×1 IMPLANT
SUT VIC AB 3-0 X1 27 (SUTURE) ×2 IMPLANT
SYR CONTROL 10ML LL (SYRINGE) IMPLANT
TOWEL OR 17X24 6PK STRL BLUE (TOWEL DISPOSABLE) ×2 IMPLANT
TOWEL OR 17X26 10 PK STRL BLUE (TOWEL DISPOSABLE) ×2 IMPLANT
WATER STERILE IRR 1000ML POUR (IV SOLUTION) ×2 IMPLANT

## 2012-12-22 NOTE — Progress Notes (Signed)
Samantha PA notified of HTN and difference between cuff and Aline pressures. Ordered to go by and treat cuff pressures

## 2012-12-22 NOTE — Anesthesia Procedure Notes (Addendum)
Performed by: Carmela Rima   Procedure Name: Intubation Date/Time: 12/22/2012 8:45 AM Performed by: Carmela Rima Pre-anesthesia Checklist: Patient identified, Emergency Drugs available, Suction available, Patient being monitored and Timeout performed Patient Re-evaluated:Patient Re-evaluated prior to inductionOxygen Delivery Method: Circle system utilized Preoxygenation: Pre-oxygenation with 100% oxygen Intubation Type: IV induction Ventilation: Mask ventilation without difficulty Laryngoscope Size: Mac and 3 Grade View: Grade III Tube type: Oral Tube size: 7.5 mm Number of attempts: 2 Airway Equipment and Method: Stylet and LTA kit utilized Placement Confirmation: ETT inserted through vocal cords under direct vision,  breath sounds checked- equal and bilateral,  positive ETCO2 and CO2 detector Secured at: 22 cm Tube secured with: Tape Dental Injury: Teeth and Oropharynx as per pre-operative assessment

## 2012-12-22 NOTE — Progress Notes (Signed)
[facility] Progress Note  12/22/2012 3:25 PM Day of Surger  Subjective:  Having some pain around her jaw and soreness  110's-140's systolic HR 60-90's regular 98%   Filed Vitals:   12/22/12 1320  BP: 143/70  Pulse: 97  Temp: 98.3 F (36.8 C)  Resp: 17     Physical Exam: Neuro:  In tact; no difficulty swallowing. Incision:  Bandage is in tact with minimal bloody drainage on bandage.  CBC    Component Value Date/Time   WBC 14.3* 12/22/2012 1442   RBC 3.74* 12/22/2012 1442   HGB 11.1* 12/22/2012 1442   HCT 32.6* 12/22/2012 1442   PLT 176 12/22/2012 1442   MCV 87.2 12/22/2012 1442   MCH 29.7 12/22/2012 1442   MCHC 34.0 12/22/2012 1442   RDW 12.7 12/22/2012 1442    BMET    Component Value Date/Time   NA 137 12/10/2012 0940   K 3.9 12/10/2012 0940   CL 99 12/10/2012 0940   CO2 27 12/10/2012 0940   GLUCOSE 109* 12/10/2012 0940   BUN 31* 12/10/2012 0940   CREATININE 1.11* 12/10/2012 0940   CALCIUM 10.2 12/10/2012 0940   GFRNONAA 48* 12/10/2012 0940   GFRAA 56* 12/10/2012 0940     Intake/Output Summary (Last 24 hours) at 12/22/12 1525 Last data filed at 12/22/12 1200  Gross per 24 hour  Intake   1050 ml  Output    100 ml  Net    950 ml      Assessment/Plan:  This is a 72 y.o. female who is s/p right CEA Day of Surgery   -pt doing well this afternoon. -neuro exam is in tact -probable discharge tomorrow morning.   Doreatha Massed, PA-C [facility] [phone]

## 2012-12-22 NOTE — H&P (View-Only) (Signed)
Subjective:     Patient ID: Amber Barry, female   DOB: Dec 02, 1940, 72 y.o.   MRN: [id number]  HPI this 72 year old female was referred by Dr. Blair Heys for evaluation of severe right ICA stenosis. Patient denies any history of stroke. She denies any lateralizing weakness, amaurosis fugax, aphasia, diplopia, blurred vision, or syncope. Patient was found to have an 80% right ICA stenosis after a carotid bruit was heard on physical exam. He does not take daily aspirin or antilipid drugs.  Past Medical History  Diagnosis Date  . Carotid artery occlusion April 2014    Left Bruit  . Hypertension   . Arthritis     Osteoarthritis  . Anxiety   . Osteopenia   . Anemia   . Interstitial cystitis   . CKD (chronic kidney disease)     Stage III    History  Substance Use Topics  . Smoking status: Never Smoker   . Smokeless tobacco: Never Used  . Alcohol Use: No    Family History  Problem Relation Age of Onset  . COPD Mother     Respiratory Disease  . Stroke Father   . Hypertension Father     Allergies  Allergen Reactions  . Benicar Hct (Olmesartan Medoxomil-Hctz) Swelling    Facial swelling  . Buspar (Buspirone)     Facial Dysesthesia  . Norvasc (Amlodipine Besylate) Swelling  . Zoloft (Sertraline Hcl) Anxiety    Increased anxiety  . Amitriptyline Hcl Anxiety    And Paresthesias    Current outpatient prescriptions:ALPRAZolam (XANAX) 0.25 MG tablet, Take 0.25 mg by mouth at bedtime as needed for sleep., Disp: , Rfl: ;  calcium carbonate (OS-CAL) 600 MG TABS, Take 600 mg by mouth 2 (two) times daily with a meal., Disp: , Rfl: ;  celecoxib (CELEBREX) 200 MG capsule, Take 200 mg by mouth 2 (two) times daily., Disp: , Rfl: ;  Cholecalciferol (VITAMIN D) 2000 UNITS CAPS, Take by mouth., Disp: , Rfl:  moexipril-hydrochlorothiazide (UNIRETIC) 15-25 MG per tablet, Take 1 tablet by mouth daily., Disp: , Rfl: ;  pseudoephedrine (SUDAFED) 30 MG tablet, Take 30 mg by mouth every 4 (four)  hours as needed for congestion., Disp: , Rfl: ;  atorvastatin (LIPITOR) 10 MG tablet, Take 1 tablet (10 mg total) by mouth daily., Disp: 30 tablet, Rfl: 0;  Azelastine HCl (ASTEPRO) 0.15 % SOLN, Place 2 sprays into the nose., Disp: , Rfl:   BP 157/84  Pulse 84  Resp 16  Ht 4\' 11"  (1.499 m)  Wt 125 lb (56.7 kg)  BMI 25.23 kg/m2  Body mass index is 25.23 kg/(m^2).          Review of Systems denies chest pain, dyspnea on exertion, PND, orthopnea, hemoptysis, claudication.    Objective:   Physical Exam blood pressure 157/84 heart rate 84 respirations 16 Gen.-alert and oriented x3 in no apparent distress HEENT normal for age Lungs no rhonchi or wheezing Cardiovascular regular rhythm no murmurs carotid pulses 3+ palpable , right carotid bruit  Abdomen soft nontender no palpable masses Musculoskeletal free of  major deformities Skin clear -no rashes Neurologic normal Lower extremities 3+ femoral and dorsalis pedis pulses palpable bilaterally with no edema  Today I ordered a carotid duplex exam which I reviewed and compared to previous study. I agreed she does have an 80% right ICA stenosis. The left internal carotid has minimal stenosis.      Assessment:     80% right ICA stenosis-asymptomatic Discussed this with patient  and her daughter and son-in-law including risks and benefits of surgery Also discussed the need to begin antilipid drugs-Lipitor    Plan:     #1 right carotid endarterectomy will be scheduled in the near future Patient will call and arrange this sometime in the near future Suggested that she start daily aspirin 81 mg

## 2012-12-22 NOTE — OR Nursing (Signed)
10:24 prep neuro checks- grips strong and equal , moves all 4 extremities, tongue midline. Post op same.

## 2012-12-22 NOTE — Progress Notes (Signed)
Utilization review completed.  

## 2012-12-22 NOTE — Anesthesia Preprocedure Evaluation (Addendum)
Anesthesia Evaluation    Reviewed: Allergy & Precautions, H&P , NPO status , Patient's Chart, lab work & pertinent test results  History of Anesthesia Complications (+) PONV  Airway Mallampati: II TM Distance: <3 FB Neck ROM: full    Dental  (+) Teeth Intact and Dental Advidsory Given   Pulmonary neg pulmonary ROS,          Cardiovascular hypertension, + Peripheral Vascular Disease     Neuro/Psych Anxiety negative neurological ROS     GI/Hepatic negative GI ROS, Neg liver ROS,   Endo/Other  negative endocrine ROS  Renal/GU Renal InsufficiencyRenal disease     Musculoskeletal   Abdominal   Peds  Hematology   Anesthesia Other Findings   Reproductive/Obstetrics                          Anesthesia Physical Anesthesia Plan  ASA: III  Anesthesia Plan: General   Post-op Pain Management:    Induction: Intravenous  Airway Management Planned: Oral ETT  Additional Equipment: Arterial line  Intra-op Plan:   Post-operative Plan: Extubation in OR  Informed Consent:   Dental Advisory Given  Plan Discussed with: CRNA, Anesthesiologist and Surgeon  Anesthesia Plan Comments:        Anesthesia Quick Evaluation

## 2012-12-22 NOTE — Anesthesia Postprocedure Evaluation (Signed)
Anesthesia Post Note  Patient: Amber Barry  Procedure(s) Performed: Procedure(s) (LRB): Right Carotid Endarterectomy with hemashield patch angioplasty (Right)  Anesthesia type: general  Patient location: PACU  Post pain: Pain level controlled  Post assessment: Patient's Cardiovascular Status Stable  Last Vitals:  Filed Vitals:   12/22/12 1121  BP: 145/64  Pulse: 86  Temp:   Resp: 17    Post vital signs: Reviewed and stable  Level of consciousness: sedated  Complications: No apparent anesthesia complications

## 2012-12-22 NOTE — Transfer of Care (Signed)
Immediate Anesthesia Transfer of Care Note  Patient: Amber Barry  Procedure(s) Performed: Procedure(s): Right Carotid Endarterectomy with hemashield patch angioplasty (Right)  Patient Location: PACU  Anesthesia Type:General  Level of Consciousness: awake, alert  and oriented  Airway & Oxygen Therapy: Patient Spontanous Breathing and Patient connected to nasal cannula oxygen  Post-op Assessment: Report given to PACU RN, Post -op Vital signs reviewed and stable, Patient moving all extremities, Patient moving all extremities X 4 and Patient able to stick tongue midline  Post vital signs: Reviewed and stable  Complications: No apparent anesthesia complications

## 2012-12-22 NOTE — Preoperative (Signed)
Beta Blockers   Reason not to administer Beta Blockers:Not Applicable 

## 2012-12-22 NOTE — Op Note (Signed)
OPERATIVE REPORT  Date of Surgery: 12/22/2012  Surgeon: [name], MD  Assistant: Nurse  Pre-op Diagnosis: RIGHT ICA STENOSIS--severe -- asymptomatic  Post-op Diagnosis: Same  Procedure: Procedure(s): Right Carotid Endarterectomy with hemashield patch angioplasty  Anesthesia: General  EBL: 50 cc  Complications: None  Procedure Details: The patient was taken to the operating room and placed in the supine position. Following induction of satisfactory general endotracheal anesthesia the right neck was prepped and draped in a routine sterile manner. Incision was made on the anterior border of the sternocleidomastoid muscle and carried down through the subcutaneous tissue and platysma using the Bovie. Care was taken not to injure the hypoglossal nerve.. The common internal and external carotid arteries were dissected free. There was a calcified atherosclerotic plaque at the carotid bifurcation extending up the internal carotid artery. A #10 shunt was then prepared and the patient was heparinized. The carotid vessels were occluded with vascular clamps. A longitudinal opening was made in the common carotid with a 15 blade extended up the internal carotid with the Potts scissors to a point distal to the disease. The plaque was approximately 80% stenotic in severity. The distal vessel appeared normal. Shunt was inserted without difficulty reestablishing flow in about 2 minutes. A standard endarterectomy was performed with an eversion endarterectomy of the external carotid. The plaque feathered off  the distal internal carotid artery nicely not requiring any tacking sutures. The lumen was thoroughly irrigated with heparinized saline and loose debris all carefully removed. The arterotomy was then closed with a patch using continuous 6-0 Prolene. Prior to completion of the  Closure the  shunt was removed after approximately 30 minutes of shunt time. Flow was then reestablished up the external branch  initially followed by the internal branch. Protamine was given to her reverse the heparin.Following adequate hemostasis the wound was irrigated with saline and closed in layers with Vicryl ain a subcuticular fashion. Sterile dressing was applied and the patient taken to the recovery room in stable condition.  [name], MD 12/22/2012 10:22 AM

## 2012-12-22 NOTE — Interval H&P Note (Signed)
History and Physical Interval Note:  12/22/2012 8:07 AM  Amber Barry  has presented today for surgery, with the diagnosis of RIGHT ICA STENOSIS  The various methods of treatment have been discussed with the patient and family. After consideration of risks, benefits and other options for treatment, the patient has consented to  Procedure(s): ENDARTERECTOMY CAROTID (Right) as a surgical intervention .  The patient's history has been reviewed, patient examined, no change in status, stable for surgery.  I have reviewed the patient's chart and labs.  Questions were answered to the patient's satisfaction.     [name]

## 2012-12-23 ENCOUNTER — Telehealth: Payer: Self-pay | Admitting: Vascular Surgery

## 2012-12-23 ENCOUNTER — Other Ambulatory Visit: Payer: Self-pay | Admitting: *Deleted

## 2012-12-23 LAB — CBC
MCH: 29.9 pg (ref 26.0–34.0)
MCV: 87.7 fL (ref 78.0–100.0)
Platelets: 165 10*3/uL (ref 150–400)
RDW: 13.1 % (ref 11.5–15.5)

## 2012-12-23 LAB — BASIC METABOLIC PANEL
Calcium: 8.2 mg/dL — ABNORMAL LOW (ref 8.4–10.5)
Creatinine, Ser: 1 mg/dL (ref 0.50–1.10)
GFR calc Af Amer: 64 mL/min — ABNORMAL LOW (ref >90)
GFR calc non Af Amer: 55 mL/min — ABNORMAL LOW (ref >90)
Sodium: 133 mEq/L — ABNORMAL LOW (ref 135–145)

## 2012-12-23 MED ORDER — OXYCODONE HCL 5 MG PO TABS: 5.0000 mg | ORAL_TABLET | Freq: Four times a day (QID) | ORAL | Status: DC | PRN

## 2012-12-23 MED ORDER — ATORVASTATIN CALCIUM 10 MG PO TABS: 10.0000 mg | ORAL_TABLET | Freq: Every evening | ORAL | Status: DC

## 2012-12-23 NOTE — Telephone Encounter (Addendum)
Message copied by Fredrich Birks on Thu Dec 23, 2012 11:32 AM ------      Message from: Lorin Mercy K      Created: Thu Dec 23, 2012  8:47 AM      Regarding: schedule                   ----- Message -----         From: Dara Lords, PA-C         Sent: 12/23/2012   7:31 AM           To: Sharee Pimple, CMA            S/p right CEA 12/22/12.  F/u with [facility] in 2 weeks.            Thanks,      Samantha ------  12/23/12: left message for patient, dpm

## 2012-12-23 NOTE — Progress Notes (Signed)
Patient VSS. Discharge instructions reviewed with patient and patient's daughter. Medications for home reviewed. All questions answered by RN. PIV removed. Patient assisted to wheelchair. Family has all belongings. Tech travelled with patient in wheelchair to car.

## 2012-12-23 NOTE — Discharge Summary (Signed)
[facility] Discharge Summary  Amber Barry 04/24/1941 72 y.o. female  [id number]  Admission Date: 12/22/2012  Discharge Date: 12/23/2012  Physician: No att. providers found  Admission Diagnosis: RIGHT ICA STENOSIS   HPI:   This is a 72 y.o. female was referred by Dr. Blair Heys for evaluation of severe right ICA stenosis. Patient denies any history of stroke. She denies any lateralizing weakness, amaurosis fugax, aphasia, diplopia, blurred vision, or syncope. Patient was found to have an 80% right ICA stenosis after a carotid bruit was heard on physical exam. She does not take daily aspirin or antilipid drugs.   Hospital Course:  The patient was admitted to the hospital and taken to the operating room on 12/22/2012 and underwent right carotid endarterectomy.  The pt tolerated the procedure well and was transported to the PACU in good condition.   By POD 1, the pt neuro status alert and oriented x 3.  The remainder of the hospital course consisted of increasing mobilization and increasing intake of solids without difficulty.    Recent Labs  12/23/12 0415  NA 133*  K 4.2  CL 101  CO2 22  GLUCOSE 123*  BUN 16  CALCIUM 8.2*    Recent Labs  12/22/12 1442 12/23/12 0415  WBC 14.3* 8.2  HGB 11.1* 9.7*  HCT 32.6* 28.4*  PLT 176 165   No results found for this basename: INR,  in the last 72 hours  Discharge Instructions:   The patient is discharged to home with extensive instructions on wound care and progressive ambulation.  They are instructed not to drive or perform any heavy lifting until returning to see the physician in his office.      Discharge Orders   Future Appointments Provider Department Dept Phone   01/04/2013 3:15 PM Pryor Ochoa, MD [facility] [phone]   10/20/2013 9:30 AM Annamaria Boots, MD [facility] [phone]   Future Orders Complete By Expires     CAROTID  Sugery: Call MD for difficulty swallowing or speaking; weakness in arms or legs that is a new symtom; severe headache.  If you have increased swelling in the neck and/or  are having difficulty breathing, CALL 911  As directed     Call MD for:  redness, tenderness, or signs of infection (pain, swelling, bleeding, redness, odor or green/yellow discharge around incision site)  As directed     Call MD for:  severe or increased pain, loss or decreased feeling  in affected limb(s)  As directed     Call MD for:  temperature >100.5  As directed     Driving Restrictions  As directed     Comments:      No driving for 2 weeks    Increase activity slowly  As directed     Comments:      Walk with assistance use walker or cane as needed    Lifting restrictions  As directed     Comments:      No lifting for 4 weeks    May shower   As directed     Scheduling Instructions:      Friday    No dressing needed  As directed     Resume previous diet  As directed     may wash over wound with mild soap and water  As directed        Discharge Diagnosis:  RIGHT ICA STENOSIS  Secondary Diagnosis: Patient  Active Problem List   Diagnosis Date Noted  . Occlusion and stenosis of carotid artery without mention of cerebral infarction 11/30/2012   Past Medical History  Diagnosis Date  . Carotid artery occlusion April 2014    Left Bruit  . Hypertension   . Arthritis     Osteoarthritis  . Anxiety   . Osteopenia   . Anemia   . Interstitial cystitis   . PONV (postoperative nausea and vomiting)   . CKD (chronic kidney disease)     Stage III      Medication List    TAKE these medications       ALPRAZolam 0.25 MG tablet  Commonly known as:  XANAX  Take 0.125 mg by mouth 2 (two) times daily as needed for anxiety.     aspirin EC 81 MG tablet  Take 81 mg by mouth every morning.     atorvastatin 10 MG tablet  Commonly known as:  LIPITOR  Take 10 mg by mouth every evening.     CALTRATE 600 PLUS-VIT D  PO  Take 1 tablet by mouth daily at 12 noon.     moexipril-hydrochlorothiazide 15-25 MG per tablet  Commonly known as:  UNIRETIC  Take 1 tablet by mouth every morning.     oxyCODONE 5 MG immediate release tablet  Commonly known as:  ROXICODONE  Take 1 tablet (5 mg total) by mouth every 6 (six) hours as needed for pain.     Vitamin D 2000 UNITS Caps  Take 2,000 Units by mouth daily at 12 noon.        She will take tylenol for pain control.  Disposition: Stable  Patient's condition: is Good  Follow up: 1. Dr.  Hart Rochester in 2 weeks.   Doreatha Massed, PA-C [facility] [phone]  --- For [facility] use --- Instructions: Press F2 to tab through selections.  Delete question if not applicable.   Modified Rankin score at D/C (0-6): 0  IV medication needed for:  1. Hypertension: No 2. Hypotension: No  Post-op Complications: No  1. Post-op CVA or TIA: No  If yes: Event classification (right eye, left eye, right cortical, left cortical, verterobasilar, other):   If yes: Timing of event (intra-op, <6 hrs post-op, >=6 hrs post-op, unknown):   2. CN injury: No  If yes: CN  injuried   3. Myocardial infarction: No  If yes: Dx by (EKG or clinical, Troponin):   4.  CHF: No  5.  Dysrhythmia (new): No  6. Wound infection: No  7. Reperfusion symptoms: No  8. Return to OR: No  If yes: return to OR for (bleeding, neurologic, other CEA incision, other):   Discharge medications: Statin use:  Yes If No: [ ]  For Medical reasons, [ ]  Non-compliant, [ ]  Not-indicated ASA use:  Yes  If No: [ ]  For Medical reasons, [ ]  Non-compliant, [ ]  Not-indicated Beta blocker use:  No If No: [x ] For Medical reasons, [ ]  Non-compliant, [ ]  Not-indicated ACE-Inhibitor use:  Yes If No: [ ]  For Medical reasons, [ ]  Non-compliant, [ ]  Not-indicated P2Y12 Antagonist use: no, [ ]  Plavix, [ ]  Plasugrel, [ ]  Ticlopinine, [ ]  Ticagrelor, [ ]  Other, [ ]  No for medical reason, [  ] Non-compliant, [ ]  Not-indicated Anti-coagulant use:  no, [ ]  Warfarin, [ ]  Rivaroxaban, [ ]  Dabigatran, [ ]  Other, [ ]  No for medical reason, [ ]  Non-compliant, [ ]  Not-indicated

## 2012-12-23 NOTE — Progress Notes (Addendum)
[facility] Progress Note  12/23/2012 7:21 AM POD 1  Subjective:  Doing well this am.  No complaints  Tm 100.3 now afebrile HR 60's-100's regular 100's-140's systolic 95% RA  Filed Vitals:   12/23/12 0350  BP: 112/55  Pulse: 83  Temp: 98.3 F (36.8 C)  Resp: 19     Physical Exam: Neuro:  In tact Incision:  C/d/i without hematoma  CBC    Component Value Date/Time   WBC 8.2 12/23/2012 0415   RBC 3.24* 12/23/2012 0415   HGB 9.7* 12/23/2012 0415   HCT 28.4* 12/23/2012 0415   PLT 165 12/23/2012 0415   MCV 87.7 12/23/2012 0415   MCH 29.9 12/23/2012 0415   MCHC 34.2 12/23/2012 0415   RDW 13.1 12/23/2012 0415    BMET    Component Value Date/Time   NA 133* 12/23/2012 0415   K 4.2 12/23/2012 0415   CL 101 12/23/2012 0415   CO2 22 12/23/2012 0415   GLUCOSE 123* 12/23/2012 0415   BUN 16 12/23/2012 0415   CREATININE 1.00 12/23/2012 0415   CALCIUM 8.2* 12/23/2012 0415   GFRNONAA 55* 12/23/2012 0415   GFRAA 64* 12/23/2012 0415     Intake/Output Summary (Last 24 hours) at 12/23/12 0721 Last data filed at 12/23/12 0400  Gross per 24 hour  Intake 2904.17 ml  Output    250 ml  Net 2654.17 ml      Assessment/Plan:  This is a 72 y.o. female who is s/p right CEA 1 Day Post-Op  -pt is doing well this am. -acute surgical blood loss anemia-tolerating -pt neuro exam is in tact -pt has ambulated -will discharge home this am.  F/u with Dr. Hart Rochester in 2 weeks.   Doreatha Massed, PA-C [facility] [phone]   Agree with above No hematoma Neck incision intact Neuro intact  Will d/c home  Fabienne Bruns, MD [facility] [place] Office: [phone] Pager: [phone]

## 2012-12-23 NOTE — Progress Notes (Signed)
[facility] Discharge Summary  Amber Barry 1941-04-25 72 y.o. female  [id number]  Admission Date: 12/22/2012  Discharge Date: 12/23/2012  Physician: Pryor Ochoa, MD  Admission Diagnosis: RIGHT ICA STENOSIS   HPI:   This is a 72 y.o. female was referred by Dr. Blair Heys for evaluation of severe right ICA stenosis. Patient denies any history of stroke. She denies any lateralizing weakness, amaurosis fugax, aphasia, diplopia, blurred vision, or syncope. Patient was found to have an 80% right ICA stenosis after a carotid bruit was heard on physical exam. She does not take daily aspirin or antilipid drugs.   Hospital Course:  The patient was admitted to the hospital and taken to the operating room on 12/22/2012 and underwent right carotid endarterectomy.  The pt tolerated the procedure well and was transported to the PACU in good condition.   By POD 1, the pt neuro status alert and oriented x 3.  The remainder of the hospital course consisted of increasing mobilization and increasing intake of solids without difficulty.    Recent Labs  12/23/12 0415  NA 133*  K 4.2  CL 101  CO2 22  GLUCOSE 123*  BUN 16  CALCIUM 8.2*    Recent Labs  12/22/12 1442 12/23/12 0415  WBC 14.3* 8.2  HGB 11.1* 9.7*  HCT 32.6* 28.4*  PLT 176 165   No results found for this basename: INR,  in the last 72 hours  Discharge Instructions:   The patient is discharged to home with extensive instructions on wound care and progressive ambulation.  They are instructed not to drive or perform any heavy lifting until returning to see the physician in his office.  Discharge Orders   Future Appointments Provider Department Dept Phone   10/20/2013 9:30 AM Annamaria Boots, MD [facility] [phone]   Future Orders Complete By Expires     CAROTID Sugery: Call MD for difficulty swallowing or speaking; weakness in arms or legs that is a new symtom; severe  headache.  If you have increased swelling in the neck and/or  are having difficulty breathing, CALL 911  As directed     Call MD for:  redness, tenderness, or signs of infection (pain, swelling, bleeding, redness, odor or green/yellow discharge around incision site)  As directed     Call MD for:  severe or increased pain, loss or decreased feeling  in affected limb(s)  As directed     Call MD for:  temperature >100.5  As directed     Driving Restrictions  As directed     Comments:      No driving for 2 weeks    Increase activity slowly  As directed     Comments:      Walk with assistance use walker or cane as needed    Lifting restrictions  As directed     Comments:      No lifting for 4 weeks    May shower   As directed     Scheduling Instructions:      Friday    No dressing needed  As directed     Resume previous diet  As directed     may wash over wound with mild soap and water  As directed        Discharge Diagnosis:  RIGHT ICA STENOSIS  Secondary Diagnosis: Patient Active Problem List   Diagnosis Date Noted  . Occlusion and stenosis of carotid artery without mention of  cerebral infarction 11/30/2012   Past Medical History  Diagnosis Date  . Carotid artery occlusion April 2014    Left Bruit  . Hypertension   . Arthritis     Osteoarthritis  . Anxiety   . Osteopenia   . Anemia   . Interstitial cystitis   . PONV (postoperative nausea and vomiting)   . CKD (chronic kidney disease)     Stage III      Medication List    TAKE these medications       ALPRAZolam 0.25 MG tablet  Commonly known as:  XANAX  Take 0.125 mg by mouth 2 (two) times daily as needed for anxiety.     aspirin EC 81 MG tablet  Take 81 mg by mouth every morning.     atorvastatin 10 MG tablet  Commonly known as:  LIPITOR  Take 10 mg by mouth every evening.     CALTRATE 600 PLUS-VIT D PO  Take 1 tablet by mouth daily at 12 noon.     moexipril-hydrochlorothiazide 15-25 MG per tablet   Commonly known as:  UNIRETIC  Take 1 tablet by mouth every morning.     oxyCODONE 5 MG immediate release tablet  Commonly known as:  ROXICODONE  Take 1 tablet (5 mg total) by mouth every 6 (six) hours as needed for pain.     Vitamin D 2000 UNITS Caps  Take 2,000 Units by mouth daily at 12 noon.        She will take tylenol for pain control.  Disposition: Stable  Patient's condition: is Good  Follow up: 1. Dr.  Hart Rochester in 2 weeks.   Doreatha Massed, PA-C [facility] [phone]  --- For [facility] use --- Instructions: Press F2 to tab through selections.  Delete question if not applicable.   Modified Rankin score at D/C (0-6): 0  IV medication needed for:  1. Hypertension: No 2. Hypotension: No  Post-op Complications: No  1. Post-op CVA or TIA: No  If yes: Event classification (right eye, left eye, right cortical, left cortical, verterobasilar, other):   If yes: Timing of event (intra-op, <6 hrs post-op, >=6 hrs post-op, unknown):   2. CN injury: No  If yes: CN  injuried   3. Myocardial infarction: No  If yes: Dx by (EKG or clinical, Troponin):   4.  CHF: No  5.  Dysrhythmia (new): No  6. Wound infection: No  7. Reperfusion symptoms: No  8. Return to OR: No  If yes: return to OR for (bleeding, neurologic, other CEA incision, other):   Discharge medications: Statin use:  Yes If No: [ ]  For Medical reasons, [ ]  Non-compliant, [ ]  Not-indicated ASA use:  Yes  If No: [ ]  For Medical reasons, [ ]  Non-compliant, [ ]  Not-indicated Beta blocker use:  No If No: [x ] For Medical reasons, [ ]  Non-compliant, [ ]  Not-indicated ACE-Inhibitor use:  Yes If No: [ ]  For Medical reasons, [ ]  Non-compliant, [ ]  Not-indicated P2Y12 Antagonist use: no, [ ]  Plavix, [ ]  Plasugrel, [ ]  Ticlopinine, [ ]  Ticagrelor, [ ]  Other, [ ]  No for medical reason, [ ]  Non-compliant, [ ]  Not-indicated Anti-coagulant use:  no, [ ]  Warfarin, [ ]  Rivaroxaban, [ ]   Dabigatran, [ ]  Other, [ ]  No for medical reason, [ ]  Non-compliant, [ ]  Not-indicated

## 2012-12-24 ENCOUNTER — Encounter (HOSPITAL_COMMUNITY): Payer: Self-pay | Admitting: Vascular Surgery

## 2012-12-24 LAB — TYPE AND SCREEN
ABO/RH(D): A POS
Antibody Screen: NEGATIVE
Unit division: 0

## 2012-12-30 ENCOUNTER — Encounter: Payer: Self-pay | Admitting: Vascular Surgery

## 2012-12-31 ENCOUNTER — Ambulatory Visit (INDEPENDENT_AMBULATORY_CARE_PROVIDER_SITE_OTHER): Payer: Medicare Other | Admitting: Vascular Surgery

## 2012-12-31 ENCOUNTER — Other Ambulatory Visit: Payer: Self-pay | Admitting: *Deleted

## 2012-12-31 ENCOUNTER — Encounter: Payer: Self-pay | Admitting: Vascular Surgery

## 2012-12-31 DIAGNOSIS — I6529 Occlusion and stenosis of unspecified carotid artery: Secondary | ICD-10-CM

## 2012-12-31 DIAGNOSIS — Z48812 Encounter for surgical aftercare following surgery on the circulatory system: Secondary | ICD-10-CM

## 2012-12-31 NOTE — Progress Notes (Signed)
Subjective:     Patient ID: Amber Barry, female   DOB: Apr 30, 1941, 72 y.o.   MRN: [id number]  HPI a carotid endarterectomy for severe asymptomatic right ICA stenosis she has done well since discharge from the hospital. She denies any headaches, lateralizing weakness, aphasia, amaurosis fugax, syncope, hoarseness, or difficulty swallowing. She is taking one aspirin per day. She is on Lipitor.  Past Medical History  Diagnosis Date  . Carotid artery occlusion April 2014    Left Bruit  . Hypertension   . Arthritis     Osteoarthritis  . Anxiety   . Osteopenia   . Anemia   . Interstitial cystitis   . PONV (postoperative nausea and vomiting)   . CKD (chronic kidney disease)     Stage III    History  Substance Use Topics  . Smoking status: Never Smoker   . Smokeless tobacco: Never Used  . Alcohol Use: No    Family History  Problem Relation Age of Onset  . COPD Mother     Respiratory Disease  . Stroke Father   . Hypertension Father     Allergies  Allergen Reactions  . Benicar Hct (Olmesartan Medoxomil-Hctz) Swelling    Facial swelling  . Buspar (Buspirone)     Facial Dysesthesia  . Norvasc (Amlodipine Besylate) Swelling    legs  . Zoloft (Sertraline Hcl) Anxiety    Increased anxiety, dysesthesia  . Lisinopril Swelling    Around eyes  . Tylenol (Acetaminophen) Nausea Only    Nausea with some /most pain meds- aspirin  . Amitriptyline Hcl Anxiety    And Paresthesias    Current outpatient prescriptions:ALPRAZolam (XANAX) 0.25 MG tablet, Take 0.125 mg by mouth 2 (two) times daily as needed for anxiety. , Disp: , Rfl: ;  aspirin EC 81 MG tablet, Take 81 mg by mouth every morning., Disp: , Rfl: ;  atorvastatin (LIPITOR) 10 MG tablet, Take 1 tablet (10 mg total) by mouth every evening., Disp: 30 tablet, Rfl: 3;  Calcium-Vitamin D (CALTRATE 600 PLUS-VIT D PO), Take 1 tablet by mouth daily at 12 noon., Disp: , Rfl:  Cholecalciferol (VITAMIN D) 2000 UNITS CAPS, Take 2,000 Units  by mouth daily at 12 noon. , Disp: , Rfl: ;  moexipril-hydrochlorothiazide (UNIRETIC) 15-25 MG per tablet, Take 1 tablet by mouth every morning. , Disp: , Rfl: ;  oxyCODONE (ROXICODONE) 5 MG immediate release tablet, Take 1 tablet (5 mg total) by mouth every 6 (six) hours as needed for pain., Disp: 30 tablet, Rfl: 0  BP 118/50  Pulse 84  Ht 4\' 11"  (1.499 m)  Wt 120 lb 12.8 oz (54.795 kg)  BMI 24.39 kg/m2  SpO2 100%  Body mass index is 24.39 kg/(m^2).         Review of Systems     Objective:   Physical Exam blood pressure 118/50 heart rate 84 respirations 18 General well-developed well-nourished female in no apparent distress Neck incision healing nicely no evidence of infection. 3+ pulse with mild edema throughout incision. No bruits heard. Neurologic exam normal      Assessment:     Doing well 10 days post right carotid endarterectomy for severe asymptomatic stenosis-mild left ICA stenosis    Plan:     Return in 6 months with carotid duplex exam unless develops neurologic symptoms in the interim Continue daily aspirin

## 2013-01-04 ENCOUNTER — Ambulatory Visit: Payer: Medicare Other | Admitting: Vascular Surgery

## 2013-01-14 DIAGNOSIS — J309 Allergic rhinitis, unspecified: Secondary | ICD-10-CM | POA: Diagnosis not present

## 2013-01-14 DIAGNOSIS — I10 Essential (primary) hypertension: Secondary | ICD-10-CM | POA: Diagnosis not present

## 2013-01-28 DIAGNOSIS — H6120 Impacted cerumen, unspecified ear: Secondary | ICD-10-CM | POA: Diagnosis not present

## 2013-03-01 DIAGNOSIS — M2550 Pain in unspecified joint: Secondary | ICD-10-CM | POA: Diagnosis not present

## 2013-03-01 DIAGNOSIS — IMO0001 Reserved for inherently not codable concepts without codable children: Secondary | ICD-10-CM | POA: Diagnosis not present

## 2013-03-01 DIAGNOSIS — F411 Generalized anxiety disorder: Secondary | ICD-10-CM | POA: Diagnosis not present

## 2013-05-11 DIAGNOSIS — M159 Polyosteoarthritis, unspecified: Secondary | ICD-10-CM | POA: Diagnosis not present

## 2013-05-11 DIAGNOSIS — J309 Allergic rhinitis, unspecified: Secondary | ICD-10-CM | POA: Diagnosis not present

## 2013-05-11 DIAGNOSIS — I10 Essential (primary) hypertension: Secondary | ICD-10-CM | POA: Diagnosis not present

## 2013-05-11 DIAGNOSIS — F411 Generalized anxiety disorder: Secondary | ICD-10-CM | POA: Diagnosis not present

## 2013-05-11 DIAGNOSIS — E559 Vitamin D deficiency, unspecified: Secondary | ICD-10-CM | POA: Diagnosis not present

## 2013-05-11 DIAGNOSIS — M899 Disorder of bone, unspecified: Secondary | ICD-10-CM | POA: Diagnosis not present

## 2013-05-11 DIAGNOSIS — Z1331 Encounter for screening for depression: Secondary | ICD-10-CM | POA: Diagnosis not present

## 2013-05-18 DIAGNOSIS — Z23 Encounter for immunization: Secondary | ICD-10-CM | POA: Diagnosis not present

## 2013-07-04 ENCOUNTER — Encounter: Payer: Self-pay | Admitting: Family

## 2013-07-05 ENCOUNTER — Ambulatory Visit (INDEPENDENT_AMBULATORY_CARE_PROVIDER_SITE_OTHER): Payer: Medicare Other | Admitting: Family

## 2013-07-05 ENCOUNTER — Encounter: Payer: Self-pay | Admitting: Family

## 2013-07-05 ENCOUNTER — Ambulatory Visit (HOSPITAL_COMMUNITY)
Admission: RE | Admit: 2013-07-05 | Discharge: 2013-07-05 | Disposition: A | Discharge status: Home / Self Care | Payer: Medicare Other | Source: Ambulatory Visit | Attending: Vascular Surgery | Admitting: Vascular Surgery

## 2013-07-05 DIAGNOSIS — Z48812 Encounter for surgical aftercare following surgery on the circulatory system: Secondary | ICD-10-CM | POA: Diagnosis not present

## 2013-07-05 DIAGNOSIS — I6529 Occlusion and stenosis of unspecified carotid artery: Secondary | ICD-10-CM

## 2013-07-05 NOTE — Patient Instructions (Addendum)
Stroke Prevention Some medical conditions and behaviors are associated with an increased chance of having a stroke. You may prevent a stroke by making healthy choices and managing medical conditions. Reduce your risk of having a stroke by:  Staying physically active. Get at least 30 minutes of activity on most or all days.  Not smoking. It may also be helpful to avoid exposure to secondhand smoke.  Limiting alcohol use. Moderate alcohol use is considered to be:  No more than 2 drinks per day for men.  No more than 1 drink per day for nonpregnant women.  Eating healthy foods.  Include 5 or more servings of fruits and vegetables a day.  Certain diets may be prescribed to address high blood pressure, high cholesterol, diabetes, or obesity.  Managing your cholesterol levels.  A low-saturated fat, low-trans fat, low-cholesterol, and high-fiber diet may control cholesterol levels.  Take any prescribed medicines to control cholesterol as directed by your caregiver.  Managing your diabetes.  A controlled-carbohydrate, controlled-sugar diet is recommended to manage diabetes.  Take any prescribed medicines to control diabetes as directed by your caregiver.  Controlling your high blood pressure (hypertension).  A low-salt (sodium), low-saturated fat, low-trans fat, and low-cholesterol diet is recommended to manage high blood pressure.  Take any prescribed medicines to control hypertension as directed by your caregiver.  Maintaining a healthy weight.  A reduced-calorie, low-sodium, low-saturated fat, low-trans fat, low-cholesterol diet is recommended to manage weight.  Stopping drug abuse.  Avoiding birth control pills.  Talk to your caregiver about the risks of taking birth control pills if you are over 25 years old, smoke, get migraines, or have ever had a blood clot.  Getting evaluated for sleep disorders (sleep apnea).  Talk to your caregiver about getting a sleep evaluation  if you snore a lot or have excessive sleepiness.  Taking medicines as directed by your caregiver.  For some people, aspirin or blood thinners (anticoagulants) are helpful in reducing the risk of forming abnormal blood clots that can lead to stroke. If you have the irregular heart rhythm of atrial fibrillation, you should be on a blood thinner unless there is a good reason you cannot take them.  Understand all your medicine instructions. SEEK IMMEDIATE MEDICAL CARE IF:   You have sudden weakness or numbness of the face, arm, or leg, especially on one side of the body.  You have sudden confusion.  You have trouble speaking (aphasia) or understanding.  You have sudden trouble seeing in one or both eyes.  You have sudden trouble walking.  You have dizziness.  You have a loss of balance or coordination.  You have a sudden, severe headache with no known cause.  You have new chest pain or an irregular heartbeat. Any of these symptoms may represent a serious problem that is an emergency. Do not wait to see if the symptoms will go away. Get medical help right away. Call your local emergency services (911 in [place] Do not drive yourself to the hospital. Document Released: 09/04/2004 Document Revised: 10/20/2011 Document Reviewed: 01/28/2013 [facility] Patient Information 2014 [place], [place].   Atherosclerosis Atherosclerosis, or hardening of the arteries, is the buildup of plaque within the major arteries in the body. Plaque is made up of fats (lipids), cholesterol, calcium, and fibrous tissue. Plaque can narrow or block blood flow within an artery. Plaque can break off and cause damage to the affected organ. Plaque can also "rupture." When plaque ruptures within an artery, a clot can form, causing  a sudden (acute) blockage of the artery. Untreated atherosclerosis can cause serious health problems or death.  COMMON ATHEROSCLEROSIS RISK FACTORS  High cholesterol  levels.  Smoking.  Obesity.  Lack of activity or exercise.  Eating a diet high in saturated fat.  Family history.  Diabetes. SYMPTOMS  Symptoms of atherosclerosis can occur when blood flow to an artery is slowed or blocked. Severity and onset of symptoms depends on how extensive the narrowing or blockage is. A sudden plaque rupture can bring immediate, life-threatening symptoms. Atherosclerosis can affect different arteries in the body, for example:  Coronary arteries. The coronary arteries supply the heart with blood. When the coronary arteries are narrowed or blocked from atherosclerosis, this is known as coronary artery disease (CAD). CAD can cause a heart attack. Common heart attack symptoms include:  Chest pain or pain that radiates to the neck, arm, jaw, or in the upper, middle back (mid-scapular pain).  Shortness of breath without cause.  Profuse sweating while at rest.  Irregular heartbeats.  Nausea or gastrointestinal upset.  Carotid arteries. The carotid arteries supply the brain with blood. They are located on each side of your neck. When blood flow to these arteries is slowed or blocked, a transiant ischemic attack (TIA) or stroke can occur. A TIA is considered a "mini-stroke" or "warning stroke." TIA symptoms are the same as stroke symptoms, but they are temporary and last less than 24 hours. A stroke can cause permanent damage or death. Common TIA and stroke symptoms include:  Sudden numbness or weakness to one side of your body, such as the face, arm, or leg.  Sudden confusion or trouble speaking or understanding.  Sudden trouble seeing out of one or both eyes.  Sudden trouble walking, loss of balance, or dizziness.  Sudden, severe headache with no known cause.  Arteries in the legs. When arteries in the lower legs become narrowed or blocked, this is known as peripheral vascular disease(PVD). PVD can cause a symptom called claudication. Claudication is pain or a  burning feeling in your legs when walking or exercising and usually goes away with rest. Very severe PVD can cause pain in your legs while at rest.  Renal arteries. The renal arteries supply the kidneys with blood. Blockage of the renal arteries can cause a decline in kidney function or high blood pressure (hypertension).  Gastrointestinal arteries (mesenteric circulation). Abdominal pain may occur after eating. DIAGNOSIS  Your caregiver may perform the following tests to diagnose atherosclerosis:  Blood tests.  Stress Test.  Echocardiogram.  Nuclear scan.  Ankle/brachial index.  Ultrasonography.  Computed tomography (CT) scan.  Angiography. TREATMENT  Atherosclerosis treatment includes the following:  Lifestyle changes such as:  Quitting smoking. Your caregiver can help you with smoking cessation.  Eat a diet low in saturated fat. A registered dietician can educate you on healthy food options such as helping you understand the difference between good fat and bad fat.  Following an exercise program approved by your caregiver.  Maintaining a healthy weight. Lose weight as approved by your caregiver.  Have your cholesterol levels checked as directed by your caregiver.  Medicines. Cholesterol medicines can help slow or stop the progression of atherosclerosis.  Different procedural or surgical interventions to treat atherosclerosis include:  Balloon angioplasty. The technical name for balloon angioplasty is called percutaneous transluminal angioplasty(PTA). In this procedure, a catheter with a small balloon at the tip is inserted through the blocked or narrowed artery. The balloon is then inflated. When the balloon is  inflated, the fatty plaque is compressed against the artery wall, allowing better blood flow within the artery.  Balloon angioplasty and stenting. In this procedure, balloon angioplasty is combined with a stenting procedure. A stent is a small, metal mesh tube  that keeps the artery open. After the artery is opened up by the balloon technique, the stent is then deployed. The stent is permanent.  Open heart surgery or bypass surgery. To perform this type of surgery, a healthy vessel is first "harvested" from either the leg or arm. The harvested vessel is then used to "bypass" the blocked atherosclerotic vessel so new blood flow can be established.  Atherectomy. Atherectomy is a procedure that uses a catheter with a sharp blade to remove plaque from an artery. A chamber in the catheter collects the plaque.  Endarterectomy. An endarterectomy is a surgical procedure where a surgeon removes plaque from an artery.  Amputation. When blockages in the lower legs are very severe and circulation cannot be restored, amputation may be required. SEEK IMMEDIATE MEDICAL CARE IF:  You are having heart attack symptoms, such as:  Chest pain or pain that radiates to the neck, arm, jaw, or in the upper, middle back (mid-scapular pain).  Shortness of breath without cause.  Profuse sweating while at rest.  Irregular heartbeats.  Nausea or gastrointestinal upset.  You are having stroke symptoms, such as sudden:  Numbness or weakness to one side of your body, such as the face, arm, or leg.  Confusion or trouble speaking or understanding.  Trouble seeing out of one or both eyes.  Trouble walking, loss of balance, or dizziness.  Severe headache with no known cause.  Your hands or feet are bluish, cold, or you have pain in them.  You have bad abdominal pain after eating. Seek help immediately if you have heart attack or stroke symptoms. Do not drive yourself to the hospital. Call your local emergency service immediately! Do not wait to see if these symptoms go away: Document Released: 10/18/2003 Document Revised: 01/27/2012 Document Reviewed: 09/30/2011 [facility] Patient Information 2014 [place], [place].

## 2013-07-05 NOTE — Progress Notes (Signed)
Established Carotid Patient  History of Present Illness  Amber Barry is a 72 y.o. female patient of Dr. Hart Rochester who is s/p right CEA in May, 2014. She returns today for surveillance.  Patient has Negative history of TIA or stroke symptom.  The patient denies amaurosis fugax or monocular blindness.  The patient denies  facial drooping.  Pt. denies hemiplegia.  The patient denies receptive or expressive aphasia.  Pt. denies extremity weakness. Patient denies history of cardiac problems, denies claudication symptoms, denies non-healing wounds. Patient denies any GI bleeding history, denies easy bruising. States her maternal grandmother had "hardening of the arteries" and her father died of a stroke, he had several strokes.   Patient denies New Medical or Surgical History.  Pt Diabetic: No Pt smoker: non-smoker  Pt meds include: Statin : No: is statin intolerant, has myalgias, states her cholesterol is always very low ASA: Yes Other anticoagulants/antiplatelets: no   Past Medical History  Diagnosis Date  . Carotid artery occlusion April 2014    Left Bruit  . Hypertension   . Arthritis     Osteoarthritis  . Anxiety   . Osteopenia   . Anemia   . Interstitial cystitis   . PONV (postoperative nausea and vomiting)   . CKD (chronic kidney disease)     Stage III    Social History History  Substance Use Topics  . Smoking status: Never Smoker   . Smokeless tobacco: Never Used  . Alcohol Use: No    Family History Family History  Problem Relation Age of Onset  . COPD Mother     Respiratory Disease  . Stroke Father   . Hypertension Father     Surgical History Past Surgical History  Procedure Laterality Date  . Flexible sigmoidoscopy  Nov. 2004    Scattered tics  . Bmd  Dec. 2004  and  Aug. 2007    Stable   and      Slightly worse   . Endarterectomy Right 12/22/2012    Procedure: Right Carotid Endarterectomy with hemashield patch angioplasty;  Surgeon: Pryor Ochoa,  MD;  [facility];  Service: Vascular;  Laterality: Right;  . Carotid endarterectomy      Allergies  Allergen Reactions  . Benicar Hct [Olmesartan Medoxomil-Hctz] Swelling    Facial swelling  . Buspar [Buspirone]     Facial Dysesthesia  . Norvasc [Amlodipine Besylate] Swelling    legs  . Zoloft [Sertraline Hcl] Anxiety    Increased anxiety, dysesthesia  . Lipitor [Atorvastatin] Other (See Comments)    Generalized muscle aches  . Lisinopril Swelling    Around eyes  . Tylenol [Acetaminophen] Nausea Only    Nausea with some /most pain meds- aspirin  . Amitriptyline Hcl Anxiety    And Paresthesias    Current Outpatient Prescriptions  Medication Sig Dispense Refill  . ALPRAZolam (XANAX) 0.25 MG tablet Take 0.125 mg by mouth 2 (two) times daily as needed for anxiety.       Marland Kitchen aspirin EC 81 MG tablet Take 81 mg by mouth every morning.      . Calcium-Vitamin D (CALTRATE 600 PLUS-VIT D PO) Take 1 tablet by mouth daily at 12 noon.      . Cholecalciferol (VITAMIN D) 2000 UNITS CAPS Take 2,000 Units by mouth daily at 12 noon.       . moexipril-hydrochlorothiazide (UNIRETIC) 15-25 MG per tablet Take 1 tablet by mouth every morning.       Marland Kitchen atorvastatin (LIPITOR) 10 MG  tablet Take 1 tablet (10 mg total) by mouth every evening.  30 tablet  3  . oxyCODONE (ROXICODONE) 5 MG immediate release tablet Take 1 tablet (5 mg total) by mouth every 6 (six) hours as needed for pain.  30 tablet  0   No current facility-administered medications for this visit.    Review of Systems : [x]  Positive   [ ]  Denies  General:[ ]  Weight loss,  [ ]  Weight gain, [ ]  Loss of appetite, [ ]  Fever, [ ]  chills  Neurologic: [ ]  Dizziness, [ ]  Blackouts, [ ]  Headaches, [ ]  Seizure [ ]  Stroke, [ ]  "Mini stroke", [ ]  Slurred speech, [ ]  Temporary blindness;  [ ] weakness,  Ear/Nose/Throat: [ ]  Change in hearing, [ ]  Nose bleeds, [ ]  Hoarseness  Vascular:[ ]  Pain in legs with walking, [ ]  Pain in feet while lying flat  , [ ]   Non-healing ulcer, [ ]  Blood clot in vein,    Pulmonary: [ ]  Home oxygen, [ ]   Productive cough, [ ]  Bronchitis, [ ]  Coughing up blood,  [ ]  Asthma, [ ]  Wheezing  Musculoskeletal:  [ ]  Arthritis, [ ]  Joint pain, [ ]  low back pain  Cardiac: [ ]  Chest pain, [ ]  Shortness of breath when lying flat, [ ]  Shortness of breath with exertion, [ ]  Palpitations, [ ]  Heart murmur, [ ]   Atrial fibrillation  Hematologic:[ ]  Easy Bruising, [ ]  Anemia; [ ]  Hepatitis  Psychiatric: [ ]   Depression, [ ]  Anxiety   Gastrointestinal: [ ]  Black stool, [ ]  Blood in stool, [ ]  Peptic ulcer disease,  [ ]  Gastroesophageal Reflux, [ ]  Trouble swallowing, [ ]  Diarrhea, [ ]  Constipation  Urinary: [ ]  chronic Kidney disease, [ ]  on HD, [ ]  Burning with urination, [ ]  Frequent urination, [ ]  Difficulty urinating;   Skin: [ ]  Rashes, [ ]  Wounds    Physical Examination  Filed Vitals:   07/05/13 1052  BP: 148/74  Pulse:   Resp:    Filed Weights   07/05/13 1051  Weight: 122 lb 6.4 oz (55.52 kg)   Body mass index is 24.71 kg/(m^2).   General: WDWN female in NAD GAIT: normal Eyes: PERRLA Pulmonary:  CTAB, Negative  Rales, Negative rhonchi, & Negative wheezing.  Cardiac: regular Rhythm ,  Negative Murmurs.  VASCULAR EXAM Carotid Bruits Left Right   Negative Negative    Radial pulses are 2+ palpable and equal.                                                                                                                            LE Pulses LEFT RIGHT       POPLITEAL  not palpable   not palpable       POSTERIOR TIBIAL   palpable    palpable        DORSALIS PEDIS      ANTERIOR TIBIAL  palpable   palpable  Gastrointestinal: soft, nontender, BS WNL, no r/g,  negative masses.  Musculoskeletal: Negative muscle atrophy/wasting. M/S 5/5 throughout, Extremities without ischemic changes.  Neurologic: A&O X 3; Appropriate Affect ; SENSATION ;normal;  Speech is normal CN 2-12 intact, Pain and  light touch intact in extremities, Motor exam as listed above.   Non-Invasive Vascular Imaging CAROTID DUPLEX 07/05/2013   Right ICA: patent CEA site with elevated velocities noted at the proximal patch site. Left ICA: 40 - 59 % stenosis.  These findings are Improved in the right ICA status post intervention from previous exam.  Assessment: CRISLYN WILLBANKS is a 72 y.o. female who presents with asymptomatic patent right ICA site with plaque forming at the proximal patch site and mild/moderate left ICA  Stenosis. The  ICA stenosis is  Improved from previous exam since the right CEA.  Her father had several strokes before he died of a stroke, patient has not had a stroke or TIA but is forming some plaque at her right carotid endarterectomy site six months after the procedure. She is statin intolerant, she is already taking a daily ASA.  Since she denies GI bleeding history and denies easy bruising, may consider daily Plavix to reduce her risk of atherosclerosis progression, but will defer to her PCP to determine whether she has any contraindications to Plavix.   Plan: Follow-up in 6 months with Carotid Duplex scan.   I discussed in depth with the patient the nature of atherosclerosis, and emphasized the importance of maximal medical management including strict control of blood pressure, blood glucose, and lipid levels, obtaining regular exercise, and continued cessation of smoking.  The patient is aware that without maximal medical management the underlying atherosclerotic disease process will progress, limiting the benefit of any interventions. The patient was given information about stroke prevention and what symptoms should prompt the patient to seek immediate medical care. Thank you for allowing [place] to participate in this patient's care.  Charisse March, RN, MSN, FNP-C [facility] Office: [phone]  Clinic Physician: Hart Rochester  07/05/2013 11:20 AM

## 2013-08-01 DIAGNOSIS — H2510 Age-related nuclear cataract, unspecified eye: Secondary | ICD-10-CM | POA: Diagnosis not present

## 2013-08-01 DIAGNOSIS — H40059 Ocular hypertension, unspecified eye: Secondary | ICD-10-CM | POA: Diagnosis not present

## 2013-08-31 ENCOUNTER — Other Ambulatory Visit: Payer: Self-pay | Admitting: Obstetrics & Gynecology

## 2013-08-31 DIAGNOSIS — Z1231 Encounter for screening mammogram for malignant neoplasm of breast: Secondary | ICD-10-CM

## 2013-09-08 ENCOUNTER — Ambulatory Visit (HOSPITAL_COMMUNITY)
Admission: RE | Admit: 2013-09-08 | Discharge: 2013-09-08 | Disposition: A | Discharge status: Home / Self Care | Payer: Medicare Other | Source: Ambulatory Visit | Attending: Obstetrics & Gynecology | Admitting: Obstetrics & Gynecology

## 2013-09-08 DIAGNOSIS — Z1231 Encounter for screening mammogram for malignant neoplasm of breast: Secondary | ICD-10-CM | POA: Insufficient documentation

## 2013-10-20 ENCOUNTER — Ambulatory Visit: Payer: Self-pay | Admitting: Obstetrics & Gynecology

## 2013-11-09 DIAGNOSIS — M899 Disorder of bone, unspecified: Secondary | ICD-10-CM | POA: Diagnosis not present

## 2013-11-09 DIAGNOSIS — M159 Polyosteoarthritis, unspecified: Secondary | ICD-10-CM | POA: Diagnosis not present

## 2013-11-09 DIAGNOSIS — M949 Disorder of cartilage, unspecified: Secondary | ICD-10-CM | POA: Diagnosis not present

## 2013-11-09 DIAGNOSIS — E559 Vitamin D deficiency, unspecified: Secondary | ICD-10-CM | POA: Diagnosis not present

## 2013-11-09 DIAGNOSIS — Z1331 Encounter for screening for depression: Secondary | ICD-10-CM | POA: Diagnosis not present

## 2013-11-09 DIAGNOSIS — I6529 Occlusion and stenosis of unspecified carotid artery: Secondary | ICD-10-CM | POA: Diagnosis not present

## 2013-11-09 DIAGNOSIS — I129 Hypertensive chronic kidney disease with stage 1 through stage 4 chronic kidney disease, or unspecified chronic kidney disease: Secondary | ICD-10-CM | POA: Diagnosis not present

## 2013-11-09 DIAGNOSIS — R7301 Impaired fasting glucose: Secondary | ICD-10-CM | POA: Diagnosis not present

## 2013-11-09 DIAGNOSIS — N183 Chronic kidney disease, stage 3 unspecified: Secondary | ICD-10-CM | POA: Diagnosis not present

## 2013-11-09 DIAGNOSIS — F411 Generalized anxiety disorder: Secondary | ICD-10-CM | POA: Diagnosis not present

## 2013-11-09 DIAGNOSIS — J309 Allergic rhinitis, unspecified: Secondary | ICD-10-CM | POA: Diagnosis not present

## 2013-11-14 ENCOUNTER — Telehealth: Payer: Self-pay | Admitting: *Deleted

## 2013-11-15 NOTE — Telephone Encounter (Signed)
error 

## 2013-11-24 DIAGNOSIS — M76899 Other specified enthesopathies of unspecified lower limb, excluding foot: Secondary | ICD-10-CM | POA: Diagnosis not present

## 2013-11-25 ENCOUNTER — Encounter: Payer: Self-pay | Admitting: Obstetrics & Gynecology

## 2013-11-28 ENCOUNTER — Encounter: Payer: Self-pay | Admitting: Obstetrics & Gynecology

## 2013-11-28 ENCOUNTER — Ambulatory Visit (INDEPENDENT_AMBULATORY_CARE_PROVIDER_SITE_OTHER): Payer: Medicare Other | Admitting: Obstetrics & Gynecology

## 2013-11-28 VITALS — BP 160/74 | HR 64 | Resp 16 | Ht 58.5 in | Wt 119.2 lb

## 2013-11-28 DIAGNOSIS — Z124 Encounter for screening for malignant neoplasm of cervix: Secondary | ICD-10-CM | POA: Diagnosis not present

## 2013-11-28 DIAGNOSIS — Z01419 Encounter for gynecological examination (general) (routine) without abnormal findings: Secondary | ICD-10-CM

## 2013-11-28 NOTE — Progress Notes (Signed)
73 y.o. G2P2 WidowedCaucasianF here for annual exam.  With Dr. Manus GunningEhinger last year, pt had a new bruit.  Had carotid u/s showing significant blockage on right.  Was about 80%.  Had carotid endarterectomy 5/14.  Having every six month u/s's every six month.  Blockage of about 50% on the left.  U/S has been stable thus far.  Will have one more with six month interval.  If stable, will go to ultrasound every 12 months.    Was initially started on aspirin and Lipitor.  The Lipitor made her feel so badly, so she stopped after six weeks.  Dr. Hart RochesterLawson, vascular surgeon, felt she should try a lipid lowering agent.  Dr. Manus GunningEhinger was ok with this but didn't really think this would made a different.  Patient and Dr. Manus GunningEhinger have really discussed this back and forth.    Had BMD 12/13.  Off Fosamax x 2 years.    Patient's last menstrual period was 08/11/2002.          Sexually active: no  The current method of family planning is none.    Exercising: yes  walking Smoker:  no  Health Maintenance: Pap:  05/09/10-WNL History of abnormal Pap:  no MMG:  09/08/13-normal Colonoscopy:  8/09-repeat in 10 years BMD:   12/13 TDaP:  9/12 Screening Labs: PCP, Hb today: PCP, Urine today: PCP   reports that she has never smoked. She has never used smokeless tobacco. She reports that she does not drink alcohol or use illicit drugs.  Past Medical History  Diagnosis Date  . Carotid artery occlusion April 2014    Left Bruit  . Hypertension   . Arthritis     Osteoarthritis  . Anxiety   . Osteopenia   . Anemia   . Interstitial cystitis   . PONV (postoperative nausea and vomiting)   . CKD (chronic kidney disease)     Stage III  . Hematuria   . Ocular migraine   . Bursitis     left hip-had injection 2015    Past Surgical History  Procedure Laterality Date  . Flexible sigmoidoscopy  Nov. 2004    Scattered tics  . Bmd  Dec. 2004  and  Aug. 2007    Stable   and      Slightly worse   . Endarterectomy Right  12/22/2012    Procedure: Right Carotid Endarterectomy with hemashield patch angioplasty;  Surgeon: Pryor OchoaJames D Lawson, MD;  [facility];  Service: Vascular;  Laterality: Right;  . Carotid endarterectomy      will have US every 6 months, x one year, than yearly    Current Outpatient Prescriptions  Medication Sig Dispense Refill  . ALPRAZolam (XANAX) 0.25 MG tablet Take 0.125 mg by mouth 2 (two) times daily as needed for anxiety.       Marland Kitchen. aspirin EC 81 MG tablet Take 81 mg by mouth every morning.      . Calcium-Vitamin D (CALTRATE 600 PLUS-VIT D PO) Take 1 tablet by mouth daily at 12 noon.      . Celecoxib (CELEBREX PO) Take by mouth as needed.      . Cholecalciferol (VITAMIN D) 2000 UNITS CAPS Take 2,000 Units by mouth daily at 12 noon.       . moexipril-hydrochlorothiazide (UNIRETIC) 15-25 MG per tablet Take 1 tablet by mouth every morning.        No current facility-administered medications for this visit.    Family History  Problem Relation Age of  Onset  . COPD Mother     Respiratory Disease  . Stroke Father   . Hypertension Father   . Diabetes Paternal Aunt   . Prostate cancer Maternal Grandfather   . Hypertension Paternal Aunt   . Hypertension Paternal Grandmother   . Rheumatic fever Mother   . Pulmonary disease Mother   . Other Maternal Grandmother     hardening arteries  . Stroke Other     paternal side    ROS:  Pertinent items are noted in HPI.  Otherwise, a comprehensive ROS was negative.  Exam:   BP 160/74  Pulse 64  Resp 16  Ht 4' 10.5" (1.486 m)  Wt 119 lb 3.2 oz (54.069 kg)  BMI 24.49 kg/m2  LMP 08/11/2002  Weight change:-3#  Height: 4' 10.5" (148.6 cm)  Ht Readings from Last 3 Encounters:  11/28/13 4' 10.5" (1.486 m)  07/05/13 4\' 11"  (1.499 m)  12/31/12 4\' 11"  (1.499 m)    General appearance: alert, cooperative and appears stated age Head: Normocephalic, without obvious abnormality, atraumatic Neck: no adenopathy, supple, symmetrical, trachea midline  and thyroid normal to inspection and palpation Lungs: clear to auscultation bilaterally Breasts: normal appearance, no masses or tenderness Heart: regular rate and rhythm Abdomen: soft, non-tender; bowel sounds normal; no masses,  no organomegaly Extremities: extremities normal, atraumatic, no cyanosis or edema Skin: Skin color, texture, turgor normal. No rashes or lesions Lymph nodes: Cervical, supraclavicular, and axillary nodes normal. No abnormal inguinal nodes palpated Neurologic: Grossly normal   Pelvic: External genitalia:  no lesions              Urethra:  normal appearing urethra with no masses, tenderness or lesions              Bartholins and Skenes: normal                 Vagina: normal appearing vagina with normal color and discharge, no lesions              Cervix: no lesions              Pap taken: yes Bimanual Exam:  Uterus:  normal size, contour, position, consistency, mobility, non-tender              Adnexa: normal adnexa and no mass, fullness, tenderness               Rectovaginal: Confirms               Anus:  normal sphincter tone, no lesions  A:  Well Woman with normal exam Hypertension.  (Pt checks at home and diastolic is usually in the 60s).  Possible white coat hypertension contributing to her underlying hypertension PMP, no HRT Osteopenia.  On Fosamax drug holiday over last 1 1/2 yrs.  P:   Mammogram screening discussed.  Pt is considering doing every other year MMG.  If so, I feel she should do 3D MMG for screening. BMD in December.   We will schedule for patient.   pap smear return annually or prn  An After Visit Summary was printed and given to the patient.

## 2013-11-28 NOTE — Patient Instructions (Signed)

## 2013-12-01 LAB — IPS PAP SMEAR ONLY

## 2013-12-05 ENCOUNTER — Ambulatory Visit (INDEPENDENT_AMBULATORY_CARE_PROVIDER_SITE_OTHER): Payer: Medicare Other | Admitting: Obstetrics & Gynecology

## 2013-12-05 ENCOUNTER — Encounter: Payer: Self-pay | Admitting: Obstetrics & Gynecology

## 2013-12-05 VITALS — BP 130/60 | HR 64 | Resp 16 | Ht 58.5 in | Wt 116.4 lb

## 2013-12-05 DIAGNOSIS — K5900 Constipation, unspecified: Secondary | ICD-10-CM | POA: Insufficient documentation

## 2013-12-05 NOTE — Patient Instructions (Signed)
Colace 100mg  up to 4 times daily.  Store brand is fine.  (Docusate sodium) Miralax.  One scoop in 4 ounces of water or juice.  Can you daily.   Both of these are safe.  NO LAXATIVES.

## 2013-12-05 NOTE — Progress Notes (Signed)
Subjective:     Patient ID: Amber Barry, female   DOB: 27-Feb-1941, 73 y.o.   MRN: [id number]  HPI 73 y.o. G2P2 WidowedCaucasianF here for repeat pelvic exam.  Pt was seen 4/20 but I could not complete pelvic exam due to constipation.  Pt has been more aware of water and used some natural tea products.  Had three BMs between yesterday and this morning.  Does want some advice about OTC products for constipation  Review of Systems     Objective:   Physical Exam  Genitourinary: Vagina normal and uterus normal. There is no rash or tenderness on the right labia. There is no rash or tenderness on the left labia. Right adnexum displays no tenderness and no fullness. Left adnexum displays no tenderness and no fullness.  Lymphadenopathy:       Left: No inguinal adenopathy present.       Assessment:     Constipation, improved Normal pelvic exam     Plan:     Colace and Miralax discussed.  No laxatives.  F/U one year.

## 2014-01-03 ENCOUNTER — Encounter: Payer: Self-pay | Admitting: Vascular Surgery

## 2014-01-04 ENCOUNTER — Encounter: Payer: Self-pay | Admitting: Family

## 2014-01-04 ENCOUNTER — Ambulatory Visit (INDEPENDENT_AMBULATORY_CARE_PROVIDER_SITE_OTHER): Payer: Medicare Other | Admitting: Family

## 2014-01-04 ENCOUNTER — Ambulatory Visit (HOSPITAL_COMMUNITY)
Admission: RE | Admit: 2014-01-04 | Discharge: 2014-01-04 | Disposition: A | Discharge status: Home / Self Care | Payer: Medicare Other | Source: Ambulatory Visit | Attending: Family | Admitting: Family

## 2014-01-04 VITALS — BP 144/70 | HR 72 | Resp 16 | Ht 59.0 in | Wt 118.0 lb

## 2014-01-04 DIAGNOSIS — I6529 Occlusion and stenosis of unspecified carotid artery: Secondary | ICD-10-CM

## 2014-01-04 DIAGNOSIS — I658 Occlusion and stenosis of other precerebral arteries: Secondary | ICD-10-CM | POA: Insufficient documentation

## 2014-01-04 DIAGNOSIS — Z48812 Encounter for surgical aftercare following surgery on the circulatory system: Secondary | ICD-10-CM

## 2014-01-04 NOTE — Progress Notes (Signed)
Established Carotid Patient   History of Present Illness  Amber Barry is a 73 y.o. female patient of Dr. Hart RochesterLawson who is s/p right CEA in May, 2014. She returns today for surveillance.  Patient has Negative history of TIA or stroke symptom. The patient denies amaurosis fugax or monocular blindness. The patient denies  facial drooping.  Pt. denies hemiplegia. The patient denies receptive or expressive aphasia. Pt. denies extremity weakness.  Patient denies history of cardiac problems, denies claudication symptoms, denies non-healing wounds.  Patient denies any GI bleeding history, denies easy bruising.  States her maternal grandmother had "hardening of the arteries" and her father died of a stroke, he had several strokes.  Patient denies New Medical or Surgical History.  Pt Diabetic: No  Pt smoker: non-smoker  Pt meds include:  Statin : No: is atorvastatin intolerant, has myalgias, states her cholesterol is always very low , has not tried any other statins ASA: Yes, 81 mg Other anticoagulants/antiplatelets: no   Past Medical History  Diagnosis Date  . Carotid artery occlusion April 2014    Left Bruit, 80% blockage  . Hypertension   . Arthritis     Osteoarthritis  . Anxiety   . Osteopenia   . Anemia   . Interstitial cystitis   . PONV (postoperative nausea and vomiting)   . CKD (chronic kidney disease)     Stage III  . Hematuria   . Ocular migraine   . Bursitis     left hip-had injection 2015    Social History History  Substance Use Topics  . Smoking status: Never Smoker   . Smokeless tobacco: Never Used  . Alcohol Use: No    Family History Family History  Problem Relation Age of Onset  . COPD Mother     Respiratory Disease  . Rheumatic fever Mother   . Pulmonary disease Mother   . Heart attack Mother   . Stroke Father   . Hypertension Father   . Diabetes Paternal Aunt   . Prostate cancer Maternal Grandfather   . Hypertension Paternal Aunt   . Hypertension  Paternal Grandmother   . Other Maternal Grandmother     hardening arteries  . Stroke Other     paternal side    Surgical History Past Surgical History  Procedure Laterality Date  . Endarterectomy Right 12/22/2012    Procedure: Right Carotid Endarterectomy with hemashield patch angioplasty;  Surgeon: Pryor OchoaJames D Lawson, MD;  [facility];  Service: Vascular;  Laterality: Right;  . Carotid endarterectomy Right 12-22-12    cea    Allergies  Allergen Reactions  . Benicar Hct [Olmesartan Medoxomil-Hctz] Swelling    Facial swelling  . Buspar [Buspirone]     Facial Dysesthesia  . Norvasc [Amlodipine Besylate] Swelling    legs  . Zoloft [Sertraline Hcl] Anxiety    Increased anxiety, dysesthesia  . Lipitor [Atorvastatin] Other (See Comments)    Generalized muscle aches  . Lisinopril Swelling    Around eyes  . Tylenol [Acetaminophen] Nausea Only    Nausea with some /most pain meds- aspirin  . Amitriptyline Hcl Anxiety    And Paresthesias    Current Outpatient Prescriptions  Medication Sig Dispense Refill  . ALPRAZolam (XANAX) 0.25 MG tablet Take 0.125 mg by mouth 2 (two) times daily as needed for anxiety.       Marland Kitchen. aspirin EC 81 MG tablet Take 81 mg by mouth every morning.      . Calcium-Vitamin D (CALTRATE 600 PLUS-VIT D  PO) Take 1 tablet by mouth daily at 12 noon.      . Celecoxib (CELEBREX PO) Take by mouth as needed.      . Cholecalciferol (VITAMIN D) 2000 UNITS CAPS Take 2,000 Units by mouth daily at 12 noon.       . moexipril-hydrochlorothiazide (UNIRETIC) 15-25 MG per tablet Take 1 tablet by mouth every morning. 15 mg univasc    And  25 mg mizide  Take  Once daily      . Pseudoephedrine HCl (SUDAFED PO) Take by mouth as needed.       No current facility-administered medications for this visit.    Review of Systems : See HPI for pertinent positives and negatives.  Physical Examination   Filed Vitals:   01/04/14 1041 01/04/14 1044  BP: 135/69 144/70  Pulse: 74 72   Resp:  16  Height:  4\' 11"  (1.499 m)  Weight:  118 lb (53.524 kg)  SpO2:  100%  Body mass index is 23.82 kg/(m^2).   General: WDWN female in NAD  GAIT: normal  Eyes: PERRLA  Pulmonary: CTAB, Negative Rales, Negative rhonchi, & Negative wheezing.  Cardiac: regular Rhythm , Negative Murmurs.  VASCULAR EXAM  Carotid Bruits  Left  Right    Negative  Negative   Radial pulses are 2+ palpable and equal.  LE Pulses  LEFT  RIGHT   POPLITEAL  not palpable  not palpable   POSTERIOR TIBIAL  palpable  palpable   DORSALIS PEDIS  ANTERIOR TIBIAL  2+ palpable  2+palpable   Gastrointestinal: soft, nontender, BS WNL, no r/g, negative masses.  Musculoskeletal: Negative muscle atrophy/wasting. M/S 5/5 throughout, Extremities without ischemic changes.  Neurologic: A&O X 3; Appropriate Affect ; SENSATION ;normal;  Speech is normal  CN 2-12 intact, Pain and light touch intact in extremities, Motor exam as listed above.  Non-Invasive Vascular Imaging CAROTID DUPLEX 01/04/2014   CEREBROVASCULAR DUPLEX EVALUATION    INDICATION: Carotid artery disease     PREVIOUS INTERVENTION(S): Right carotid endarterectomy with hemashield patch angioplasty on 12/22/2012.    DUPLEX EXAM:     RIGHT  LEFT  Peak Systolic Velocities (cm/s) End Diastolic Velocities (cm/s) Plaque LOCATION Peak Systolic Velocities (cm/s) End Diastolic Velocities (cm/s) Plaque  77 19  CCA PROXIMAL 115 30   71 21 HT CCA MID 95 28   200 57 HT CCA DISTAL 90 24   109 13  ECA 89 17   156 11  ICA PROXIMAL 52 17   118 28  ICA MID 120 38 HT  121 47  ICA DISTAL 130 39      ICA / CCA Ratio (PSV) 1.36  Antegrade  Vertebral Flow Antegrade   130 Brachial Systolic Pressure (mmHg) 130  Triphasic  Brachial Artery Waveforms Triphasic     Plaque Morphology:  HM = Homogeneous, HT = Heterogeneous, CP = Calcific Plaque, SP = Smooth Plaque, IP = Irregular Plaque     ADDITIONAL FINDINGS:     IMPRESSION: Patent right carotid endarterectomy site  with evidence of mild hyperplasia and elevated velocities noted at the proximal patch site; Velocities more than double from the pre-stenosis velocity, suggesting a >50% stenosis. Velocities in the distal right internal carotid artery suggest a 40-59% stenosis (low end of range). Left internal carotid artery velocities suggest a <40% stenosis (high end of range).      Compared to the previous exam:  Disease progression of the right proximal patch site when compared to the last exam on  07/05/2013.     Assessment: IRASEMA MCKITRICK is a 73 y.o. female who is s/p right CEA in May, 2014. She has no history of stroke or TIA. Patent right carotid endarterectomy site with evidence of mild hyperplasia and elevated velocities noted at the proximal patch site; Velocities more than double from the pre-stenosis velocity, suggesting a >50% stenosis. Velocities in the distal right internal carotid artery suggest a 40-59% stenosis (low end of range). Left internal carotid artery velocities suggest a <40% stenosis (high end of range). Disease progression of the right proximal patch site when compared to the last exam on 07/05/2013.  Mild increase in stenosis in both ICA's in 6 months; has tried atorvastatin with severe myalgias, consider another statin such as a weak statin, e.g., pravastatin, since evidence indicates the use of a statin is associated with a greater reduction in CVD risk and improved endothelial function , will defer to PCP. Will try increasing 81 mg ASA to 325 enteric coated daily, taken with food.  Plan: Follow-up in 6 months with Carotid Duplex scan.   I discussed in depth with the patient the nature of atherosclerosis, and emphasized the importance of maximal medical management including strict control of blood pressure, blood glucose, and lipid levels, obtaining regular exercise, and continued cessation of smoking.  The patient is aware that without maximal medical management the underlying  atherosclerotic disease process will progress, limiting the benefit of any interventions. The patient was given information about stroke prevention and what symptoms should prompt the patient to seek immediate medical care. Thank you for allowing [place] to participate in this patient's care.  Charisse March, RN, MSN, FNP-C [facility] Office: [phone]  Clinic Physician: Edilia Bo  01/04/2014 10:52 AM

## 2014-01-04 NOTE — Addendum Note (Signed)
Addended by: Sharee Pimple on: 01/04/2014 01:48 PM   Modules accepted: Orders

## 2014-01-06 DIAGNOSIS — I6529 Occlusion and stenosis of unspecified carotid artery: Secondary | ICD-10-CM | POA: Diagnosis not present

## 2014-01-06 DIAGNOSIS — M79609 Pain in unspecified limb: Secondary | ICD-10-CM | POA: Diagnosis not present

## 2014-01-09 DIAGNOSIS — M545 Low back pain, unspecified: Secondary | ICD-10-CM

## 2014-01-09 HISTORY — DX: Low back pain, unspecified: M54.50

## 2014-01-16 DIAGNOSIS — M4600 Spinal enthesopathy, site unspecified: Secondary | ICD-10-CM | POA: Diagnosis not present

## 2014-01-16 DIAGNOSIS — M25559 Pain in unspecified hip: Secondary | ICD-10-CM | POA: Diagnosis not present

## 2014-01-19 DIAGNOSIS — M545 Low back pain, unspecified: Secondary | ICD-10-CM | POA: Diagnosis not present

## 2014-01-23 DIAGNOSIS — M545 Low back pain, unspecified: Secondary | ICD-10-CM | POA: Diagnosis not present

## 2014-01-25 DIAGNOSIS — B079 Viral wart, unspecified: Secondary | ICD-10-CM | POA: Diagnosis not present

## 2014-01-26 ENCOUNTER — Encounter: Payer: Self-pay | Admitting: Obstetrics & Gynecology

## 2014-01-26 ENCOUNTER — Telehealth: Payer: Self-pay | Admitting: Obstetrics & Gynecology

## 2014-01-26 ENCOUNTER — Ambulatory Visit (INDEPENDENT_AMBULATORY_CARE_PROVIDER_SITE_OTHER): Payer: Medicare Other | Admitting: Obstetrics & Gynecology

## 2014-01-26 VITALS — BP 138/64 | HR 84 | Resp 16 | Ht 58.5 in | Wt 119.4 lb

## 2014-01-26 DIAGNOSIS — N9089 Other specified noninflammatory disorders of vulva and perineum: Secondary | ICD-10-CM | POA: Diagnosis not present

## 2014-01-26 DIAGNOSIS — I6529 Occlusion and stenosis of unspecified carotid artery: Secondary | ICD-10-CM

## 2014-01-26 DIAGNOSIS — N950 Postmenopausal bleeding: Secondary | ICD-10-CM

## 2014-01-26 DIAGNOSIS — M545 Low back pain, unspecified: Secondary | ICD-10-CM | POA: Diagnosis not present

## 2014-01-26 NOTE — Telephone Encounter (Signed)
Pt came in to talk with a nurse she is having some spotting

## 2014-01-26 NOTE — Telephone Encounter (Signed)
Patient was a walk in to the office. Spoke with patient. Patient was seen last month for her annual with Dr.Miller and was advised to let her know if she ever experiences any bleeding. Patient states "I never have had any bleeding until this morning." Patient noticed "spotting" when she was getting ready for PT. Noticed when wiping that there was "A spot a red blood but there was none in my clothing. Then I went to the restroom again and there was some spotting again when I wiped. I did not know what to do so I came here after physical therapy." Started PT last Thursday for back pain. Has been performing "lower back exercises and pelvic tilts." Has had three PT sessions so far and does exercises on off days.Patient states that this kind of exercise is new to her and has been challenging.Patient had carotid artery surgery last May and was started on Parastatin the first of June. Patient was also seen by a pediatrist and started on 6 days of Prednisone which she completed last Sunday. Denies abdominal pain, fevers, and urinary symptoms. Advised patient that Dr.Miller can see patient today at 3:30pm. Advised to take 800mg  of ibuprofen/motrin in case Dr.Miller would like to do a EMB. Advised this would be decided after evaluation and may not be needed. Patient agreeable and verbalizes understanding.  EMB order placed and sent for precert as possible EMB. Jessica notified.  Routing to provider for final review. Patient agreeable to disposition. Will close encounter

## 2014-01-30 ENCOUNTER — Encounter: Payer: Self-pay | Admitting: Obstetrics & Gynecology

## 2014-01-30 DIAGNOSIS — M545 Low back pain, unspecified: Secondary | ICD-10-CM | POA: Diagnosis not present

## 2014-01-30 NOTE — Progress Notes (Signed)
Subjective:     Patient ID: Amber Barry, female   DOB: November 21, 1940, 73 y.o.   MRN: [id number]  HPI 73 yo G2P2 WWF here for complaint of vaginal bleeding today.  She noticed this when she wipes.  She is pretty sure it is vaginal.  Denies hematuria or blood in stool.  Has not had any recent changes in bowel habits.  Feels like she might have a "cut" on her labia.  There is a little irritation present.  Denies other discharge.  No pelvic pain or cramping.  No mood changes.  No feeling or hormonal changes.  Review of Systems  All other systems reviewed and are negative.      Objective:   Physical Exam  Constitutional: She is oriented to person, place, and time. She appears well-developed and well-nourished.  Abdominal: Soft. Bowel sounds are normal.  Genitourinary: Vagina normal and uterus normal.    There is no rash, lesion or injury on the right labia. There is lesion on the left labia. There is no rash, tenderness or injury on the left labia. Right adnexum displays no mass, no tenderness and no fullness. Left adnexum displays no mass, no tenderness and no fullness. No bleeding around the vagina. No vaginal discharge found.  Lymphadenopathy:       Right: No inguinal adenopathy present.       Left: No inguinal adenopathy present.  Neurological: She is alert and oriented to person, place, and time.  Skin: Skin is warm and dry.  Psychiatric: She has a normal mood and affect.       Assessment:     Bleeding most likely from vulvar lesions--hemangioma.    Plan:     Advised to use topical neosporin on lesion for up to a week, then stop.  No evidence of vaginal bleeding.  Do not feel endometrial biopsy is indicated.  Pt aware to call if she has ANY future bleeding.

## 2014-02-02 DIAGNOSIS — M545 Low back pain, unspecified: Secondary | ICD-10-CM | POA: Diagnosis not present

## 2014-02-06 DIAGNOSIS — M545 Low back pain, unspecified: Secondary | ICD-10-CM | POA: Diagnosis not present

## 2014-02-08 DIAGNOSIS — M545 Low back pain, unspecified: Secondary | ICD-10-CM | POA: Diagnosis not present

## 2014-02-13 DIAGNOSIS — M545 Low back pain, unspecified: Secondary | ICD-10-CM | POA: Diagnosis not present

## 2014-02-14 DIAGNOSIS — M545 Low back pain, unspecified: Secondary | ICD-10-CM | POA: Diagnosis not present

## 2014-02-15 ENCOUNTER — Telehealth: Payer: Self-pay | Admitting: Obstetrics & Gynecology

## 2014-02-15 NOTE — Telephone Encounter (Signed)
Left message for patient to call back. Need to go over benefits and schedule EMB. °

## 2014-02-17 DIAGNOSIS — M545 Low back pain, unspecified: Secondary | ICD-10-CM | POA: Diagnosis not present

## 2014-02-20 DIAGNOSIS — M545 Low back pain, unspecified: Secondary | ICD-10-CM | POA: Diagnosis not present

## 2014-02-23 DIAGNOSIS — M545 Low back pain, unspecified: Secondary | ICD-10-CM | POA: Diagnosis not present

## 2014-02-27 DIAGNOSIS — B079 Viral wart, unspecified: Secondary | ICD-10-CM | POA: Diagnosis not present

## 2014-02-28 DIAGNOSIS — M545 Low back pain, unspecified: Secondary | ICD-10-CM | POA: Diagnosis not present

## 2014-03-03 DIAGNOSIS — M545 Low back pain, unspecified: Secondary | ICD-10-CM | POA: Diagnosis not present

## 2014-03-08 DIAGNOSIS — M545 Low back pain, unspecified: Secondary | ICD-10-CM | POA: Diagnosis not present

## 2014-03-10 DIAGNOSIS — M545 Low back pain, unspecified: Secondary | ICD-10-CM | POA: Diagnosis not present

## 2014-03-17 ENCOUNTER — Other Ambulatory Visit: Payer: Self-pay | Admitting: Orthopedic Surgery

## 2014-03-17 DIAGNOSIS — M5416 Radiculopathy, lumbar region: Secondary | ICD-10-CM

## 2014-03-17 DIAGNOSIS — M545 Low back pain, unspecified: Secondary | ICD-10-CM

## 2014-03-22 DIAGNOSIS — I6529 Occlusion and stenosis of unspecified carotid artery: Secondary | ICD-10-CM | POA: Diagnosis not present

## 2014-03-25 ENCOUNTER — Ambulatory Visit
Admission: RE | Admit: 2014-03-25 | Discharge: 2014-03-25 | Disposition: A | Discharge status: Home / Self Care | Payer: Medicare Other | Source: Ambulatory Visit | Attending: Orthopedic Surgery | Admitting: Orthopedic Surgery

## 2014-03-25 DIAGNOSIS — M4310 Spondylolisthesis, site unspecified: Secondary | ICD-10-CM | POA: Diagnosis not present

## 2014-03-25 DIAGNOSIS — M5416 Radiculopathy, lumbar region: Secondary | ICD-10-CM

## 2014-03-25 DIAGNOSIS — M5137 Other intervertebral disc degeneration, lumbosacral region: Secondary | ICD-10-CM | POA: Diagnosis not present

## 2014-03-25 DIAGNOSIS — M545 Low back pain, unspecified: Secondary | ICD-10-CM

## 2014-03-25 DIAGNOSIS — M47817 Spondylosis without myelopathy or radiculopathy, lumbosacral region: Secondary | ICD-10-CM | POA: Diagnosis not present

## 2014-03-25 DIAGNOSIS — M5126 Other intervertebral disc displacement, lumbar region: Secondary | ICD-10-CM | POA: Diagnosis not present

## 2014-04-03 DIAGNOSIS — M545 Low back pain, unspecified: Secondary | ICD-10-CM | POA: Diagnosis not present

## 2014-04-03 DIAGNOSIS — IMO0002 Reserved for concepts with insufficient information to code with codable children: Secondary | ICD-10-CM | POA: Diagnosis not present

## 2014-04-03 DIAGNOSIS — M4310 Spondylolisthesis, site unspecified: Secondary | ICD-10-CM | POA: Diagnosis not present

## 2014-05-11 DIAGNOSIS — I779 Disorder of arteries and arterioles, unspecified: Secondary | ICD-10-CM | POA: Diagnosis not present

## 2014-05-11 DIAGNOSIS — I129 Hypertensive chronic kidney disease with stage 1 through stage 4 chronic kidney disease, or unspecified chronic kidney disease: Secondary | ICD-10-CM | POA: Diagnosis not present

## 2014-05-11 DIAGNOSIS — N183 Chronic kidney disease, stage 3 (moderate): Secondary | ICD-10-CM | POA: Diagnosis not present

## 2014-05-11 DIAGNOSIS — M1990 Unspecified osteoarthritis, unspecified site: Secondary | ICD-10-CM | POA: Diagnosis not present

## 2014-05-11 DIAGNOSIS — M8580 Other specified disorders of bone density and structure, unspecified site: Secondary | ICD-10-CM | POA: Diagnosis not present

## 2014-05-11 DIAGNOSIS — E559 Vitamin D deficiency, unspecified: Secondary | ICD-10-CM | POA: Diagnosis not present

## 2014-05-11 DIAGNOSIS — F411 Generalized anxiety disorder: Secondary | ICD-10-CM | POA: Diagnosis not present

## 2014-06-12 ENCOUNTER — Encounter: Payer: Self-pay | Admitting: Obstetrics & Gynecology

## 2014-07-09 ENCOUNTER — Encounter: Payer: Self-pay | Admitting: *Deleted

## 2014-07-11 ENCOUNTER — Encounter: Payer: Self-pay | Admitting: Family

## 2014-07-12 ENCOUNTER — Encounter: Payer: Self-pay | Admitting: Family

## 2014-07-12 ENCOUNTER — Ambulatory Visit (HOSPITAL_COMMUNITY)
Admission: RE | Admit: 2014-07-12 | Discharge: 2014-07-12 | Disposition: A | Discharge status: Home / Self Care | Payer: Medicare Other | Source: Ambulatory Visit | Attending: Family | Admitting: Family

## 2014-07-12 ENCOUNTER — Ambulatory Visit (INDEPENDENT_AMBULATORY_CARE_PROVIDER_SITE_OTHER): Payer: Medicare Other | Admitting: Family

## 2014-07-12 VITALS — BP 147/78 | HR 82 | Resp 16 | Ht 59.0 in | Wt 117.0 lb

## 2014-07-12 DIAGNOSIS — Z48812 Encounter for surgical aftercare following surgery on the circulatory system: Secondary | ICD-10-CM

## 2014-07-12 DIAGNOSIS — I6523 Occlusion and stenosis of bilateral carotid arteries: Secondary | ICD-10-CM

## 2014-07-12 DIAGNOSIS — I6529 Occlusion and stenosis of unspecified carotid artery: Secondary | ICD-10-CM

## 2014-07-12 NOTE — Progress Notes (Signed)
Established Carotid Patient   History of Present Illness  Amber Barry is a 73 y.o. female patient of Dr. Hart Rochester who is s/p right CEA in May, 2014. She returns today for surveillance.  Patient has Negative history of TIA or stroke symptom. The patient denies amaurosis fugax or monocular blindness. The patient denies  facial drooping.  Pt. denies hemiplegia. The patient denies receptive or expressive aphasia, denies pain, weakness, tingling, or numbness in either hand or arm.  Patient denies history of cardiac problems, denies claudication symptoms, denies non-healing wounds.  Patient denies any GI bleeding history, denies easy bruising.  States her maternal grandmother had "hardening of the arteries" and her father died of a stroke, he had several strokes.  Patient reports New Medical or Surgical History: MRI of her back showed arthritis in her spine, pt had physical therapy, and the pain in her legs improved, states she does not need back surgery at this point.   Pt Diabetic: No  Pt smoker: non-smoker   Pt meds include:  Statin : No: is atorvastatin and pravastatin intolerant, causes lethargy ASA: Yes, 81 mg 3x/day Other anticoagulants/antiplatelets: no  Past Medical History  Diagnosis Date  . Carotid artery occlusion April 2014    Left Bruit, 80% blockage  . Hypertension   . Arthritis     Osteoarthritis  . Anxiety   . Osteopenia   . Anemia   . Interstitial cystitis   . PONV (postoperative nausea and vomiting)   . CKD (chronic kidney disease)     Stage III  . Hematuria   . Ocular migraine   . Bursitis     left hip-had injection 2015  . Arthritis     ? diag in lower back  . Lower back pain June 2015    Social History History  Substance Use Topics  . Smoking status: Never Smoker   . Smokeless tobacco: Never Used  . Alcohol Use: No    Family History Family History  Problem Relation Age of Onset  . COPD Mother     Respiratory Disease  . Rheumatic  fever Mother   . Pulmonary disease Mother   . Heart attack Mother   . Stroke Father   . Hypertension Father   . Diabetes Paternal Aunt   . Prostate cancer Maternal Grandfather   . Hypertension Paternal Aunt   . Hypertension Paternal Grandmother   . Other Maternal Grandmother     hardening arteries  . Stroke Other     paternal side    Surgical History Past Surgical History  Procedure Laterality Date  . Endarterectomy Right 12/22/2012    Procedure: Right Carotid Endarterectomy with hemashield patch angioplasty;  Surgeon: Pryor Ochoa, MD;  Location: [facility] OR;  Service: Vascular;  Laterality: Right;  . Carotid endarterectomy Right 12-22-12    cea    Allergies  Allergen Reactions  . Benicar Hct [Olmesartan Medoxomil-Hctz] Swelling    Facial swelling  . Buspar [Buspirone]     Facial Dysesthesia  . Norvasc [Amlodipine Besylate] Swelling    legs  . Zoloft [Sertraline Hcl] Anxiety    Increased anxiety, dysesthesia  . Lipitor [Atorvastatin] Other (See Comments)    Generalized muscle aches  . Lisinopril Swelling    Around eyes  . Tylenol [Acetaminophen] Nausea Only    Nausea with some /most pain meds- aspirin  . Amitriptyline Hcl Anxiety    And Paresthesias  . Pravastatin Anxiety    Pt. Wanted to sleep, no energy.  Current Outpatient Prescriptions  Medication Sig Dispense Refill  . ALPRAZolam (XANAX) 0.25 MG tablet Take 0.125 mg by mouth 2 (two) times daily as needed for anxiety.     Marland Kitchen. aspirin EC 81 MG tablet Take 243 mg by mouth daily. Pt  Takes x's 3  81 mg tabs per day    . Calcium-Vitamin D (CALTRATE 600 PLUS-VIT D PO) Take 1 tablet by mouth daily at 12 noon.    . Celecoxib (CELEBREX PO) Take by mouth as needed. Not more than 2 per week    . Cholecalciferol (VITAMIN D) 2000 UNITS CAPS Take 2,000 Units by mouth daily at 12 noon.     . moexipril-hydrochlorothiazide (UNIRETIC) 15-25 MG per tablet Take 1 tablet by mouth every morning. 15 mg univasc    And  25 mg mizide  Take   Once daily    . Pseudoephedrine HCl (SUDAFED PO) Take by mouth as needed.    Marland Kitchen. aspirin EC 81 MG tablet Take 81 mg by mouth every morning.    . pravastatin (PRAVACHOL) 10 MG tablet Take 10 mg by mouth daily. Started 01/09/14     No current facility-administered medications for this visit.    Review of Systems : See HPI for pertinent positives and negatives.  Physical Examination  Filed Vitals:   07/12/14 1153 07/12/14 1157  BP: 125/70 147/78  Pulse: 83 82  Resp:  16  Height:  4\' 11"  (1.499 m)  Weight:  117 lb (53.071 kg)  SpO2:  100%   Body mass index is 23.62 kg/(m^2).  General: WDWN female in NAD  GAIT: normal  Eyes: PERRLA  Pulmonary: CTAB, Negative Rales, Negative rhonchi, & Negative wheezing.  Cardiac: regular Rhythm, no detected murmur.   VASCULAR EXAM  Carotid Bruits  Left  Right    Negative  Negative   Radial pulses are 2+ palpable and equal.  Aorta is not palpable  LE Pulses  LEFT  RIGHT   POPLITEAL  not palpable  not palpable   POSTERIOR TIBIAL  palpable  palpable   DORSALIS PEDIS  ANTERIOR TIBIAL  2+ palpable  2+palpable     Gastrointestinal: soft, nontender, BS WNL, no r/g, no palpated masses.  Musculoskeletal: Negative muscle atrophy/wasting. M/S 5/5 throughout, Extremities without ischemic changes.  Neurologic: A&O X 3; Appropriate Affect ; SENSATION ;normal;  Speech is normal  CN 2-12 intact, Pain and light touch intact in extremities, Motor exam as listed above.     Non-Invasive Vascular Imaging CAROTID DUPLEX 07/12/2014   CEREBROVASCULAR DUPLEX EVALUATION    INDICATION: Carotid endarterectomy     PREVIOUS INTERVENTION(S): Right carotid endarterectomy on 12/22/12    DUPLEX EXAM:     RIGHT  LEFT  Peak Systolic Velocities (cm/s) End Diastolic Velocities (cm/s) Plaque LOCATION Peak Systolic Velocities (cm/s) End Diastolic Velocities (cm/s) Plaque  76 22  CCA PROXIMAL 96 22   60 19 HM CCA MID 81 22   170 46 HM CCA  DISTAL 68 14   137 19  ECA 83 13   118 26  ICA PROXIMAL 151 40 HT  92 31  ICA MID 93 29   92 36  ICA DISTAL 83 24     Not Calculated ICA / CCA Ratio (PSV) 2.2  Antegrade Vertebral Flow Antegrade  152 Brachial Systolic Pressure (mmHg) 142  Multiphasic (subclavian artery) Brachial Artery Waveforms Multiphasic (subclavian artery)    Plaque Morphology:  HM = Homogeneous, HT = Heterogeneous, CP = Calcific Plaque, SP = Smooth Plaque,  IP = Irregular Plaque     ADDITIONAL FINDINGS: No significant stenosis of the bilateral external or left common carotid arteries.    IMPRESSION: 1. Patent right carotid endarterectomy site with Doppler velocities suggest a greater than 50% stenosis of the right mid/distal common carotid artery at the proximal patch site. 2. No right internal carotid artery stenosis noted. 3. Doppler velocities suggest a 40-59% stenosis of the left proximal/mid internal carotid artery.    Compared to the previous exam:  No significant change in the maximum bilateral carotid velocities noted when compared to the previous exam on 01/04/14.      Assessment: Amber Barry is a 73 y.o. female who is s/p right CEA in May, 2014. She presents with asymptomatic patent right carotid endarterectomy site with Doppler velocities suggest a greater than 50% stenosis of the right mid/distal common carotid artery at the proximal patch site, no right internal carotid artery stenosis noted and 40-59% stenosis of the left proximal/mid internal carotid artery. No significant change in the maximum bilateral carotid velocities noted when compared to the previous exam on 01/04/14.   Plan: Follow-up in 1 year with Carotid Duplex.   I discussed in depth with the patient the nature of atherosclerosis, and emphasized the importance of maximal medical management including strict control of blood pressure, blood glucose, and lipid levels, obtaining regular exercise, and continued cessation of smoking.  The  patient is aware that without maximal medical management the underlying atherosclerotic disease process will progress, limiting the benefit of any interventions. The patient was given information about stroke prevention and what symptoms should prompt the patient to seek immediate medical care. Thank you for allowing us to participate in this patient's care.  Charisse MarchSuzanne Nickel, RN, MSN, FNP-C [facility] [place] Office: [phone]  Clinic Physician: Edilia BoDickson  07/12/2014 12:04 PM

## 2014-08-18 DIAGNOSIS — H9190 Unspecified hearing loss, unspecified ear: Secondary | ICD-10-CM | POA: Diagnosis not present

## 2014-10-05 ENCOUNTER — Other Ambulatory Visit: Payer: Self-pay | Admitting: Obstetrics & Gynecology

## 2014-10-05 DIAGNOSIS — Z1231 Encounter for screening mammogram for malignant neoplasm of breast: Secondary | ICD-10-CM

## 2014-10-13 ENCOUNTER — Ambulatory Visit (HOSPITAL_COMMUNITY)
Admission: RE | Admit: 2014-10-13 | Discharge: 2014-10-13 | Disposition: A | Discharge status: Home / Self Care | Payer: Medicare Other | Source: Ambulatory Visit | Attending: Obstetrics & Gynecology | Admitting: Obstetrics & Gynecology

## 2014-10-13 DIAGNOSIS — Z1231 Encounter for screening mammogram for malignant neoplasm of breast: Secondary | ICD-10-CM | POA: Insufficient documentation

## 2014-10-23 ENCOUNTER — Other Ambulatory Visit: Payer: Self-pay | Admitting: Obstetrics & Gynecology

## 2014-10-23 ENCOUNTER — Telehealth: Payer: Self-pay

## 2014-10-23 DIAGNOSIS — M8580 Other specified disorders of bone density and structure, unspecified site: Secondary | ICD-10-CM

## 2014-10-23 DIAGNOSIS — E2839 Other primary ovarian failure: Secondary | ICD-10-CM

## 2014-10-23 NOTE — Telephone Encounter (Signed)
Return call to Kelly. °

## 2014-10-23 NOTE — Telephone Encounter (Signed)
lmtcb-sched[facility] for 11/03/14 at 10:15.//kn

## 2014-10-24 DIAGNOSIS — J069 Acute upper respiratory infection, unspecified: Secondary | ICD-10-CM | POA: Diagnosis not present

## 2014-10-24 NOTE — Telephone Encounter (Signed)
Patient notified.//kn 

## 2014-11-02 ENCOUNTER — Other Ambulatory Visit: Payer: Self-pay | Admitting: Obstetrics & Gynecology

## 2014-11-02 ENCOUNTER — Telehealth: Payer: Self-pay | Admitting: Obstetrics & Gynecology

## 2014-11-02 DIAGNOSIS — M8580 Other specified disorders of bone density and structure, unspecified site: Secondary | ICD-10-CM

## 2014-11-02 NOTE — Telephone Encounter (Signed)
[facility] called regarding order for bone density for patients appointment tomorrow @ 1015.  Requesting an order faxed. Let technician know we are on epic and asked if we can send that way. States order visible but not signed.  Phone: [phone] Fax: [phone]

## 2014-11-02 NOTE — Telephone Encounter (Signed)
Order faxed with fax confirmation received.   [facility] confirms [facility] have received fax.

## 2014-11-03 ENCOUNTER — Ambulatory Visit (HOSPITAL_COMMUNITY)
Admission: RE | Admit: 2014-11-03 | Discharge: 2014-11-03 | Disposition: A | Discharge status: Home / Self Care | Payer: Medicare Other | Source: Ambulatory Visit | Attending: Obstetrics & Gynecology | Admitting: Obstetrics & Gynecology

## 2014-11-03 DIAGNOSIS — M8589 Other specified disorders of bone density and structure, multiple sites: Secondary | ICD-10-CM | POA: Diagnosis not present

## 2014-11-03 DIAGNOSIS — Z13820 Encounter for screening for osteoporosis: Secondary | ICD-10-CM | POA: Diagnosis not present

## 2014-11-03 DIAGNOSIS — Z780 Asymptomatic menopausal state: Secondary | ICD-10-CM | POA: Insufficient documentation

## 2014-11-14 ENCOUNTER — Telehealth: Payer: Self-pay

## 2014-11-14 NOTE — Telephone Encounter (Signed)
Return call to Kelly. °

## 2014-11-14 NOTE — Telephone Encounter (Signed)
Lmtcb//kn 

## 2014-11-15 DIAGNOSIS — Z1389 Encounter for screening for other disorder: Secondary | ICD-10-CM | POA: Diagnosis not present

## 2014-11-15 DIAGNOSIS — M1990 Unspecified osteoarthritis, unspecified site: Secondary | ICD-10-CM | POA: Diagnosis not present

## 2014-11-15 DIAGNOSIS — N183 Chronic kidney disease, stage 3 (moderate): Secondary | ICD-10-CM | POA: Diagnosis not present

## 2014-11-15 DIAGNOSIS — E559 Vitamin D deficiency, unspecified: Secondary | ICD-10-CM | POA: Diagnosis not present

## 2014-11-15 DIAGNOSIS — F411 Generalized anxiety disorder: Secondary | ICD-10-CM | POA: Diagnosis not present

## 2014-11-15 DIAGNOSIS — J301 Allergic rhinitis due to pollen: Secondary | ICD-10-CM | POA: Diagnosis not present

## 2014-11-15 DIAGNOSIS — M8580 Other specified disorders of bone density and structure, unspecified site: Secondary | ICD-10-CM | POA: Diagnosis not present

## 2014-11-15 DIAGNOSIS — R7301 Impaired fasting glucose: Secondary | ICD-10-CM | POA: Diagnosis not present

## 2014-11-15 DIAGNOSIS — I129 Hypertensive chronic kidney disease with stage 1 through stage 4 chronic kidney disease, or unspecified chronic kidney disease: Secondary | ICD-10-CM | POA: Diagnosis not present

## 2014-11-15 DIAGNOSIS — I779 Disorder of arteries and arterioles, unspecified: Secondary | ICD-10-CM | POA: Diagnosis not present

## 2014-11-16 NOTE — Telephone Encounter (Signed)
Patient notified of [facility] BMD results. Copy will be scanned in epic.//kn 

## 2014-11-16 NOTE — Telephone Encounter (Signed)
Lmtcb//kn 

## 2014-12-15 ENCOUNTER — Encounter: Payer: Self-pay | Admitting: Obstetrics & Gynecology

## 2014-12-15 ENCOUNTER — Ambulatory Visit (INDEPENDENT_AMBULATORY_CARE_PROVIDER_SITE_OTHER): Payer: Medicare Other | Admitting: Obstetrics & Gynecology

## 2014-12-15 VITALS — BP 160/72 | HR 68 | Resp 16 | Ht 58.5 in | Wt 117.4 lb

## 2014-12-15 DIAGNOSIS — Z124 Encounter for screening for malignant neoplasm of cervix: Secondary | ICD-10-CM

## 2014-12-15 DIAGNOSIS — Z01419 Encounter for gynecological examination (general) (routine) without abnormal findings: Secondary | ICD-10-CM | POA: Diagnosis not present

## 2014-12-15 NOTE — Progress Notes (Signed)
74 y.o. G2P2 WidowedCaucasianF here for annual exam.  Doing well.  Had carotid doppler in Dec, 2015.  Will have repeat 1 year.  Not on cholesterol medications at this time due to intolerance.  Now on 81mg  ASA, 3 tabs daily.    No vaginal bleeding.    PCP:  Dr. Manus GunningEhinger.  Had appt 4/16.  Reports labs were all good.      Patient's last menstrual period was 08/11/2002.          Sexually active: No.  The current method of family planning is post menopausal status.    Exercising: No.  not regularly-PT last summer Smoker:  no  Health Maintenance: Pap:  05/09/10 WNL History of abnormal Pap:  no MMG:  10/13/14 3D-normal Colonoscopy:  8/09-repeat in 10 years BMD:   11/03/14 TDaP:  9/12 Screening Labs: PCP, Hb today: PCP, Urine today: PCP   reports that she has never smoked. She has never used smokeless tobacco. She reports that she does not drink alcohol or use illicit drugs.  Past Medical History  Diagnosis Date  . Carotid artery occlusion April 2014    Left Bruit, 80% blockage  . Hypertension   . Arthritis     Osteoarthritis  . Anxiety   . Osteopenia   . Anemia   . Interstitial cystitis   . PONV (postoperative nausea and vomiting)   . CKD (chronic kidney disease)     Stage III  . Hematuria   . Ocular migraine   . Bursitis     left hip-had injection 2015  . Arthritis     ? diag in lower back  . Lower back pain June 2015    Past Surgical History  Procedure Laterality Date  . Endarterectomy Right 12/22/2012    Procedure: Right Carotid Endarterectomy with hemashield patch angioplasty;  Surgeon: Pryor OchoaJames D Lawson, MD;  [facility];  Service: Vascular;  Laterality: Right;  . Carotid endarterectomy Right 12-22-12    cea    Current Outpatient Prescriptions  Medication Sig Dispense Refill  . acetaminophen (TYLENOL) 500 MG tablet Take 500 mg by mouth every 6 (six) hours as needed.    . ALPRAZolam (XANAX) 0.25 MG tablet Take 0.125 mg by mouth 2 (two) times daily as needed for  anxiety.     Marland Kitchen. aspirin EC 81 MG tablet Take 243 mg by mouth daily. Pt  Takes x's 3  81 mg tabs per day    . Calcium-Vitamin D (CALTRATE 600 PLUS-VIT D PO) Take 1 tablet by mouth daily at 12 noon.    . Celecoxib (CELEBREX PO) Take by mouth as needed. Not more than 2 per week    . Cholecalciferol (VITAMIN D) 2000 UNITS CAPS Take 2,000 Units by mouth daily at 12 noon.     . hydrochlorothiazide (HYDRODIURIL) 25 MG tablet   3  . moexipril (UNIVASC) 15 MG tablet   3  . Pseudoephedrine HCl (SUDAFED PO) Take by mouth as needed.    Marland Kitchen. azelastine (ASTELIN) 0.1 % nasal spray Place 2 sprays into both nostrils 2 (two) times daily. As needed  12   No current facility-administered medications for this visit.    Family History  Problem Relation Age of Onset  . COPD Mother     Respiratory Disease  . Rheumatic fever Mother   . Pulmonary disease Mother   . Heart attack Mother   . Stroke Father   . Hypertension Father   . Diabetes Paternal Aunt   . Prostate  cancer Maternal Grandfather   . Hypertension Paternal Aunt   . Hypertension Paternal Grandmother   . Other Maternal Grandmother     hardening arteries  . Stroke Other     paternal side    ROS:  Pertinent items are noted in HPI.  Otherwise, a comprehensive ROS was negative.  Exam:   LMP 08/11/2002      Ht Readings from Last 3 Encounters:  07/12/14 4\' 11"  (1.499 m)  01/26/14 4' 10.5" (1.486 m)  01/04/14 4\' 11"  (1.499 m)   General appearance: alert, cooperative and appears stated age Head: Normocephalic, without obvious abnormality, atraumatic Neck: no adenopathy, supple, symmetrical, trachea midline and thyroid normal to inspection and palpation Lungs: clear to auscultation bilaterally Breasts: normal appearance, no masses or tenderness Heart: regular rate and rhythm Abdomen: soft, non-tender; bowel sounds normal; no masses,  no organomegaly Extremities: extremities normal, atraumatic, no cyanosis or edema Skin: Skin color, texture,  turgor normal. No rashes or lesions Lymph nodes: Cervical, supraclavicular, and axillary nodes normal. No abnormal inguinal nodes palpated Neurologic: Grossly normal   Pelvic: External genitalia:  no lesions              Urethra:  normal appearing urethra with no masses, tenderness or lesions              Bartholins and Skenes: normal                 Vagina: normal appearing vagina with normal color and discharge, no lesions              Cervix: no lesions              Pap taken: No. Bimanual Exam:  Uterus:  normal size, contour, position, consistency, mobility, non-tender              Adnexa: normal adnexa and no mass, fullness, tenderness               Rectovaginal: Confirms               Anus:  normal sphincter tone, no lesions  Chaperone was present for exam.  A:  Well Woman with normal exam Hypertension. Elevated today.  Pt aware.  She does check her BPs.  Possible white coat hypertension contributing to elevated BP today. PMP, no HRT Osteopenia. On Fosamax drug holiday over last 2 1/2 yrs.  BMD stable 3/16.    P: Mammogram screening discussed. Has done one this year. BMD will be repeated 2-3 years.  For now, will stay off HRT We will schedule for patient.  pap smear 4/15.  No pap today Labs with Dr. Manus GunningEhinger.  Release signed so I can get copies. return annually or prn

## 2015-01-26 DIAGNOSIS — H903 Sensorineural hearing loss, bilateral: Secondary | ICD-10-CM | POA: Diagnosis not present

## 2015-01-31 DIAGNOSIS — H25013 Cortical age-related cataract, bilateral: Secondary | ICD-10-CM | POA: Diagnosis not present

## 2015-01-31 DIAGNOSIS — H2513 Age-related nuclear cataract, bilateral: Secondary | ICD-10-CM | POA: Diagnosis not present

## 2015-01-31 DIAGNOSIS — Z0100 Encounter for examination of eyes and vision without abnormal findings: Secondary | ICD-10-CM | POA: Diagnosis not present

## 2015-05-17 DIAGNOSIS — I779 Disorder of arteries and arterioles, unspecified: Secondary | ICD-10-CM | POA: Diagnosis not present

## 2015-05-17 DIAGNOSIS — I129 Hypertensive chronic kidney disease with stage 1 through stage 4 chronic kidney disease, or unspecified chronic kidney disease: Secondary | ICD-10-CM | POA: Diagnosis not present

## 2015-05-17 DIAGNOSIS — M1990 Unspecified osteoarthritis, unspecified site: Secondary | ICD-10-CM | POA: Diagnosis not present

## 2015-05-17 DIAGNOSIS — N183 Chronic kidney disease, stage 3 (moderate): Secondary | ICD-10-CM | POA: Diagnosis not present

## 2015-05-17 DIAGNOSIS — Z23 Encounter for immunization: Secondary | ICD-10-CM | POA: Diagnosis not present

## 2015-05-17 DIAGNOSIS — F411 Generalized anxiety disorder: Secondary | ICD-10-CM | POA: Diagnosis not present

## 2015-05-17 DIAGNOSIS — M8588 Other specified disorders of bone density and structure, other site: Secondary | ICD-10-CM | POA: Diagnosis not present

## 2015-05-17 DIAGNOSIS — E559 Vitamin D deficiency, unspecified: Secondary | ICD-10-CM | POA: Diagnosis not present

## 2015-05-17 DIAGNOSIS — R7301 Impaired fasting glucose: Secondary | ICD-10-CM | POA: Diagnosis not present

## 2015-05-17 DIAGNOSIS — J309 Allergic rhinitis, unspecified: Secondary | ICD-10-CM | POA: Diagnosis not present

## 2015-07-13 ENCOUNTER — Encounter: Payer: Self-pay | Admitting: Family

## 2015-07-17 ENCOUNTER — Other Ambulatory Visit (HOSPITAL_COMMUNITY): Payer: Medicare Other

## 2015-07-17 ENCOUNTER — Ambulatory Visit (HOSPITAL_COMMUNITY)
Admission: RE | Admit: 2015-07-17 | Discharge: 2015-07-17 | Disposition: A | Discharge status: Home / Self Care | Payer: Medicare Other | Source: Ambulatory Visit | Attending: Family | Admitting: Family

## 2015-07-17 ENCOUNTER — Ambulatory Visit: Payer: Medicare Other | Admitting: Family

## 2015-07-17 ENCOUNTER — Encounter: Payer: Self-pay | Admitting: Family

## 2015-07-17 ENCOUNTER — Ambulatory Visit (INDEPENDENT_AMBULATORY_CARE_PROVIDER_SITE_OTHER): Payer: Medicare Other | Admitting: Family

## 2015-07-17 VITALS — BP 104/70 | HR 118 | Ht 58.5 in | Wt 120.0 lb

## 2015-07-17 DIAGNOSIS — I129 Hypertensive chronic kidney disease with stage 1 through stage 4 chronic kidney disease, or unspecified chronic kidney disease: Secondary | ICD-10-CM | POA: Diagnosis not present

## 2015-07-17 DIAGNOSIS — I6523 Occlusion and stenosis of bilateral carotid arteries: Secondary | ICD-10-CM | POA: Diagnosis not present

## 2015-07-17 DIAGNOSIS — Z9889 Other specified postprocedural states: Secondary | ICD-10-CM

## 2015-07-17 DIAGNOSIS — Z48812 Encounter for surgical aftercare following surgery on the circulatory system: Secondary | ICD-10-CM | POA: Diagnosis not present

## 2015-07-17 DIAGNOSIS — N183 Chronic kidney disease, stage 3 (moderate): Secondary | ICD-10-CM | POA: Diagnosis not present

## 2015-07-17 NOTE — Progress Notes (Signed)
Chief Complaint: Carotid Artery Stenosis   History of Present Illness  Amber Barry is a 74 y.o. female patient of Dr. Hart Rochester who is s/p right CEA in May, 2014. She returns today for routine surveillance.  The patient has no history of TIA or stroke symptoms. Specifically she denies a history of amaurosis fugax or monocular blindness, unilateral facial drooping, hemiplegia, or receptive or expressive aphasia. She denies pain, weakness, tingling, or numbness in either hand or arm.  The patient denies a history of cardiac problems, denies claudication symptoms, denies non-healing wounds.  The patient denies any GI bleeding history, denies easy bruising.  She reports that her maternal grandmother had "hardening of the arteries" and her father died of a stroke, he had several strokes.  Patient reports New Medical or Surgical History: none. MRI of her back showed arthritis in her spine, pt had physical therapy, and the pain in her legs improved, states she does not need back surgery at this point.  Pt Diabetic: No  Pt smoker: non-smoker   Pt meds include:  Statin : No: is atorvastatin and pravastatin intolerant, causes lethargy ASA: Yes, 81 mg 3x/day Other anticoagulants/antiplatelets: no   Past Medical History  Diagnosis Date  . Carotid artery occlusion April 2014    Left Bruit, 80% blockage  . Hypertension   . Arthritis     Osteoarthritis  . Anxiety   . Osteopenia   . Anemia   . Interstitial cystitis   . PONV (postoperative nausea and vomiting)   . CKD (chronic kidney disease)     Stage III  . Hematuria   . Ocular migraine   . Bursitis     left hip-had injection 2015  . Arthritis     ? diag in lower back  . Lower back pain June 2015    Social History Social History  Substance Use Topics  . Smoking status: Never Smoker   . Smokeless tobacco: Never Used  . Alcohol Use: No    Family History Family History  Problem Relation Age of Onset  . COPD Mother      Respiratory Disease  . Rheumatic fever Mother   . Pulmonary disease Mother   . Heart attack Mother   . Stroke Father   . Hypertension Father   . Diabetes Paternal Aunt   . Prostate cancer Maternal Grandfather   . Hypertension Paternal Aunt   . Hypertension Paternal Grandmother   . Other Maternal Grandmother     hardening arteries  . Stroke Other     paternal side    Surgical History Past Surgical History  Procedure Laterality Date  . Endarterectomy Right 12/22/2012    Procedure: Right Carotid Endarterectomy with hemashield patch angioplasty;  Surgeon: Pryor Ochoa, MD;  Location: [facility] OR;  Service: Vascular;  Laterality: Right;  . Carotid endarterectomy Right 12-22-12    cea    Allergies  Allergen Reactions  . Benicar Hct [Olmesartan Medoxomil-Hctz] Swelling    Facial swelling  . Buspar [Buspirone]     Facial Dysesthesia  . Norvasc [Amlodipine Besylate] Swelling    legs  . Zoloft [Sertraline Hcl] Anxiety    Increased anxiety, dysesthesia  . Lipitor [Atorvastatin] Other (See Comments)    Generalized muscle aches  . Lisinopril Swelling    Around eyes  . Tylenol [Acetaminophen] Nausea Only    Nausea with some /most pain meds- aspirin Patient states will take this as needed  . Amitriptyline Hcl Anxiety    And Paresthesias  .  Pravastatin Anxiety    Pt. Wanted to sleep, no energy.    Current Outpatient Prescriptions  Medication Sig Dispense Refill  . acetaminophen (TYLENOL) 500 MG tablet Take 500 mg by mouth every 6 (six) hours as needed.    . ALPRAZolam (XANAX) 0.25 MG tablet Take 0.125 mg by mouth 2 (two) times daily as needed for anxiety.     Marland Kitchen aspirin EC 81 MG tablet Take 243 mg by mouth daily. Pt  Takes x's 3  81 mg tabs per day    . azelastine (ASTELIN) 0.1 % nasal spray Place 2 sprays into both nostrils 2 (two) times daily. As needed  12  . Calcium-Vitamin D (CALTRATE 600 PLUS-VIT D PO) Take 1 tablet by mouth daily at 12 noon.    . Celecoxib (CELEBREX PO)  Take by mouth as needed. Not more than 2 per week    . Cholecalciferol (VITAMIN D) 2000 UNITS CAPS Take 2,000 Units by mouth daily at 12 noon.     . hydrochlorothiazide (HYDRODIURIL) 25 MG tablet   3  . moexipril (UNIVASC) 15 MG tablet   3  . Pseudoephedrine HCl (SUDAFED PO) Take by mouth as needed.     No current facility-administered medications for this visit.    Review of Systems : See HPI for pertinent positives and negatives.  Physical Examination  Filed Vitals:   07/17/15 1311 07/17/15 1312  BP: 116/79 104/70  Pulse: 118   Height: 4' 10.5" (1.486 m)   Weight: 120 lb (54.432 kg)   SpO2: 100%    Body mass index is 24.65 kg/(m^2).  General: WDWN female in NAD  GAIT: normal  Eyes: PERRLA  Pulmonary: CTAB, no rales,  rhonchi, or wheezing.  Cardiac: regular rhythm, no detected murmur.   VASCULAR EXAM  Carotid Bruits  Left  Right    Negative  Negative   Radial pulses are 2+ palpable and equal.  Aorta is not palpable  LE Pulses  LEFT  RIGHT   POPLITEAL  not palpable  not palpable   POSTERIOR TIBIAL  palpable  palpable   DORSALIS PEDIS  ANTERIOR TIBIAL  2+ palpable  2+palpable     Gastrointestinal: soft, nontender, BS WNL, no r/g, no palpated masses.  Musculoskeletal: No muscle atrophy/wasting. M/S 5/5 throughout, Extremities without ischemic changes.  Neurologic: A&O X 3; Appropriate Affect, sensation is normal,  Speech is normal  CN 2-12 intact except has some hearing loss, is wearing hearing aids, pain and light touch intact in extremities, Motor exam as listed above.         Non-Invasive Vascular Imaging CAROTID DUPLEX 07/17/2015   Minimal bilateral ICA stenosis. Velocities of the left ICA could not be replicated from the previous exam of 07/12/14.    Assessment: Amber Barry is a 74 y.o. female who is s/p right CEA in May, 2014. She has no history of stroke or TIA. Today's carotid duplex suggests  minimal bilateral ICA stenosis.  Plan: Follow-up in 1 year with Carotid Duplex scan.   I discussed in depth with the patient the nature of atherosclerosis, and emphasized the importance of maximal medical management including strict control of blood pressure, blood glucose, and lipid levels, obtaining regular exercise, and continued cessation of smoking.  The patient is aware that without maximal medical management the underlying atherosclerotic disease process will progress, limiting the benefit of any interventions. The patient was given information about stroke prevention and what symptoms should prompt the patient to seek immediate medical care. Thank  you for allowing us to participate in this patient's care.  Charisse MarchSuzanne Nicklas Mcsweeney, RN, MSN, FNP-C [facility] [place] Office: [phone]  Clinic Physician: Hart RochesterLawson  07/17/2015 1:13 PM

## 2015-07-17 NOTE — Patient Instructions (Signed)
Stroke Prevention Some medical conditions and behaviors are associated with an increased chance of having a stroke. You may prevent a stroke by making healthy choices and managing medical conditions. HOW CAN I REDUCE MY RISK OF HAVING A STROKE?   Stay physically active. Get at least 30 minutes of activity on most or all days.  Do not smoke. It may also be helpful to avoid exposure to secondhand smoke.  Limit alcohol use. Moderate alcohol use is considered to be:  No more than 2 drinks per day for men.  No more than 1 drink per day for nonpregnant women.  Eat healthy foods. This involves:  Eating 5 or more servings of fruits and vegetables a day.  Making dietary changes that address high blood pressure (hypertension), high cholesterol, diabetes, or obesity.  Manage your cholesterol levels.  Making food choices that are high in fiber and low in saturated fat, trans fat, and cholesterol may control cholesterol levels.  Take any prescribed medicines to control cholesterol as directed by your health care provider.  Manage your diabetes.  Controlling your carbohydrate and sugar intake is recommended to manage diabetes.  Take any prescribed medicines to control diabetes as directed by your health care provider.  Control your hypertension.  Making food choices that are low in salt (sodium), saturated fat, trans fat, and cholesterol is recommended to manage hypertension.  Ask your health care provider if you need treatment to lower your blood pressure. Take any prescribed medicines to control hypertension as directed by your health care provider.  If you are 18-39 years of age, have your blood pressure checked every 3-5 years. If you are 40 years of age or older, have your blood pressure checked every year.  Maintain a healthy weight.  Reducing calorie intake and making food choices that are low in sodium, saturated fat, trans fat, and cholesterol are recommended to manage  weight.  Stop drug abuse.  Avoid taking birth control pills.  Talk to your health care provider about the risks of taking birth control pills if you are over 35 years old, smoke, get migraines, or have ever had a blood clot.  Get evaluated for sleep disorders (sleep apnea).  Talk to your health care provider about getting a sleep evaluation if you snore a lot or have excessive sleepiness.  Take medicines only as directed by your health care provider.  For some people, aspirin or blood thinners (anticoagulants) are helpful in reducing the risk of forming abnormal blood clots that can lead to stroke. If you have the irregular heart rhythm of atrial fibrillation, you should be on a blood thinner unless there is a good reason you cannot take them.  Understand all your medicine instructions.  Make sure that other conditions (such as anemia or atherosclerosis) are addressed. SEEK IMMEDIATE MEDICAL CARE IF:   You have sudden weakness or numbness of the face, arm, or leg, especially on one side of the body.  Your face or eyelid droops to one side.  You have sudden confusion.  You have trouble speaking (aphasia) or understanding.  You have sudden trouble seeing in one or both eyes.  You have sudden trouble walking.  You have dizziness.  You have a loss of balance or coordination.  You have a sudden, severe headache with no known cause.  You have new chest pain or an irregular heartbeat. Any of these symptoms may represent a serious problem that is an emergency. Do not wait to see if the symptoms will   go away. Get medical help at once. Call your local emergency services (911 in [place] Do not drive yourself to the hospital.   This information is not intended to replace advice given to you by your health care provider. Make sure you discuss any questions you have with your health care provider.   Document Released: 09/04/2004 Document Revised: 08/18/2014 Document Reviewed:  01/28/2013 Elsevier Interactive Patient Education 2016 [employer]vier Inc.  

## 2015-07-18 NOTE — Addendum Note (Signed)
Addended by: Dannielle KarvonenMANESS-HARRISON, CHANDA C on: 07/18/2015 02:13 PM   Modules accepted: Orders

## 2015-09-06 DIAGNOSIS — M71572 Other bursitis, not elsewhere classified, left ankle and foot: Secondary | ICD-10-CM | POA: Diagnosis not present

## 2015-09-06 DIAGNOSIS — R197 Diarrhea, unspecified: Secondary | ICD-10-CM | POA: Diagnosis not present

## 2015-09-07 DIAGNOSIS — R197 Diarrhea, unspecified: Secondary | ICD-10-CM | POA: Diagnosis not present

## 2015-11-09 ENCOUNTER — Other Ambulatory Visit: Payer: Self-pay

## 2015-11-09 DIAGNOSIS — Z1231 Encounter for screening mammogram for malignant neoplasm of breast: Secondary | ICD-10-CM

## 2015-11-12 DIAGNOSIS — F411 Generalized anxiety disorder: Secondary | ICD-10-CM | POA: Diagnosis not present

## 2015-11-12 DIAGNOSIS — E559 Vitamin D deficiency, unspecified: Secondary | ICD-10-CM | POA: Diagnosis not present

## 2015-11-12 DIAGNOSIS — J309 Allergic rhinitis, unspecified: Secondary | ICD-10-CM | POA: Diagnosis not present

## 2015-11-12 DIAGNOSIS — M1990 Unspecified osteoarthritis, unspecified site: Secondary | ICD-10-CM | POA: Diagnosis not present

## 2015-11-12 DIAGNOSIS — R7301 Impaired fasting glucose: Secondary | ICD-10-CM | POA: Diagnosis not present

## 2015-11-12 DIAGNOSIS — M8580 Other specified disorders of bone density and structure, unspecified site: Secondary | ICD-10-CM | POA: Diagnosis not present

## 2015-11-12 DIAGNOSIS — Z1389 Encounter for screening for other disorder: Secondary | ICD-10-CM | POA: Diagnosis not present

## 2015-11-12 DIAGNOSIS — I129 Hypertensive chronic kidney disease with stage 1 through stage 4 chronic kidney disease, or unspecified chronic kidney disease: Secondary | ICD-10-CM | POA: Diagnosis not present

## 2015-11-12 DIAGNOSIS — N183 Chronic kidney disease, stage 3 (moderate): Secondary | ICD-10-CM | POA: Diagnosis not present

## 2015-11-12 DIAGNOSIS — I779 Disorder of arteries and arterioles, unspecified: Secondary | ICD-10-CM | POA: Diagnosis not present

## 2015-12-19 ENCOUNTER — Ambulatory Visit
Admission: RE | Admit: 2015-12-19 | Discharge: 2015-12-19 | Disposition: A | Discharge status: Home / Self Care | Payer: Medicare Other | Source: Ambulatory Visit

## 2015-12-19 DIAGNOSIS — Z1231 Encounter for screening mammogram for malignant neoplasm of breast: Secondary | ICD-10-CM

## 2016-02-28 ENCOUNTER — Encounter: Payer: Self-pay | Admitting: Obstetrics & Gynecology

## 2016-02-28 ENCOUNTER — Ambulatory Visit (INDEPENDENT_AMBULATORY_CARE_PROVIDER_SITE_OTHER): Payer: Medicare Other | Admitting: Obstetrics & Gynecology

## 2016-02-28 VITALS — BP 138/66 | HR 56 | Resp 14 | Ht 59.0 in | Wt 122.2 lb

## 2016-02-28 DIAGNOSIS — Z124 Encounter for screening for malignant neoplasm of cervix: Secondary | ICD-10-CM

## 2016-02-28 DIAGNOSIS — Z01419 Encounter for gynecological examination (general) (routine) without abnormal findings: Secondary | ICD-10-CM

## 2016-02-28 NOTE — Patient Instructions (Signed)
Align probiotic

## 2016-02-28 NOTE — Progress Notes (Signed)
75 y.o. G2P2 Widowed Caucasian F here for annual exam.  Doing well.  No vaginal bleeding.  No surgeries or procedures this past year.  Still followed by Dr. Hart RochesterLawson for hx of right carotid endarterectomy.  Pt reports major issue this year has been a change in bowel movements.  She has done stool testing.  Having increased frequency of bowel movements, up to four times a day.  Then she will have constipation for a few days.    Patient's last menstrual period was 08/11/2002.          Sexually active: No.  The current method of family planning is post menopausal status.    Exercising: Yes.    walking Smoker:  no  Health Maintenance: Pap:  11/28/2013 negative  History of abnormal Pap:  no MMG:  12/19/2015 BIRADS 1 negative  Colonoscopy:  2009, repeat 10 years BMD:   11/03/2014 osteopenia, repeat 2-3 years TDaP:  04/2011  Pneumonia vaccine(s):  ~5 years  Zostavax:   ~5 years Hep C testing: not indicated Screening Labs: PCP, Hb today: PCP, Urine today: PCP   reports that she has never smoked. She has never used smokeless tobacco. She reports that she does not drink alcohol or use illicit drugs.  Past Medical History  Diagnosis Date  . Carotid artery occlusion April 2014    Left Bruit, 80% blockage  . Hypertension   . Arthritis     Osteoarthritis  . Anxiety   . Osteopenia   . Anemia   . Interstitial cystitis   . PONV (postoperative nausea and vomiting)   . CKD (chronic kidney disease)     Stage III  . Hematuria   . Ocular migraine   . Bursitis     left hip-had injection 2015  . Arthritis     ? diag in lower back  . Lower back pain June 2015    Past Surgical History  Procedure Laterality Date  . Endarterectomy Right 12/22/2012    Procedure: Right Carotid Endarterectomy with hemashield patch angioplasty;  Surgeon: Pryor OchoaJames D Lawson, MD;  [facility];  Service: Vascular;  Laterality: Right;  . Carotid endarterectomy Right 12-22-12    cea    Current Outpatient Prescriptions   Medication Sig Dispense Refill  . acetaminophen (TYLENOL) 500 MG tablet Take 500 mg by mouth every 6 (six) hours as needed.    . ALPRAZolam (XANAX) 0.25 MG tablet Take 0.125 mg by mouth 2 (two) times daily as needed for anxiety.     Marland Kitchen. aspirin EC 81 MG tablet Take 243 mg by mouth daily. Pt  Takes x's 3  81 mg tabs per day    . azelastine (ASTELIN) 0.1 % nasal spray Place 2 sprays into both nostrils 2 (two) times daily. As needed  12  . Calcium-Vitamin D (CALTRATE 600 PLUS-VIT D PO) Take 1 tablet by mouth daily at 12 noon.    . Celecoxib (CELEBREX PO) Take by mouth as needed. Not more than 2 per week    . Cholecalciferol (VITAMIN D) 2000 UNITS CAPS Take 2,000 Units by mouth daily at 12 noon.     . hydrochlorothiazide (HYDRODIURIL) 25 MG tablet   3  . moexipril (UNIVASC) 15 MG tablet   3  . Pseudoephedrine HCl (SUDAFED PO) Take by mouth as needed.     No current facility-administered medications for this visit.    Family History  Problem Relation Age of Onset  . COPD Mother     Respiratory Disease  .  Rheumatic fever Mother   . Pulmonary disease Mother   . Heart attack Mother   . Stroke Father   . Hypertension Father   . Diabetes Paternal Aunt   . Prostate cancer Maternal Grandfather   . Hypertension Paternal Aunt   . Hypertension Paternal Grandmother   . Other Maternal Grandmother     hardening arteries  . Stroke Other     paternal side    ROS:  Pertinent items are noted in HPI.  Otherwise, a comprehensive ROS was negative.  Exam:   Filed Vitals:   02/28/16 0853  BP: 138/66  Pulse: 56  Resp: 14  Height:  (1.499 m)  Weight: 122 lb 3.2 oz (55.43 kg)    General appearance: alert, cooperative and appears stated age Head: Normocephalic, without obvious abnormality, atraumatic Neck: no adenopathy, supple, symmetrical, trachea midline and thyroid normal to inspection and palpation Breasts: normal appearance, no masses or tenderness Abdomen: soft, non-tender; bowel  sounds normal; no masses,  no organomegaly Extremities: extremities normal, atraumatic, no cyanosis or edema Skin: Skin color, texture, turgor normal. No rashes or lesions Lymph nodes: Cervical, supraclavicular, and axillary nodes normal. No abnormal inguinal nodes palpated Neurologic: Grossly normal   Pelvic: External genitalia:  no lesions              Urethra:  normal appearing urethra with no masses, tenderness or lesions              Bartholins and Skenes: normal                 Vagina: normal appearing vagina with normal color and discharge, no lesions              Cervix: no lesions              Pap taken: No. Bimanual Exam:  Uterus:  normal size, contour, position, consistency, mobility, non-tender              Adnexa: normal adnexa and no mass, fullness, tenderness               Rectovaginal: Confirms               Anus:  normal sphincter tone, no lesions  Chaperone was present for exam.  A:  Well Woman with normal exam Hypertension. PMP, no HRT Osteopenia. On Fosamax drug holiday over last 2 1/2 yrs. BMD stable 3/16.  Change in bowel habits.  Possible IBS.  Some increased bloating.  P: Mammogram screening discussed.  BMD will be repeated 1-2 years. For now, will stay off HRT We will schedule for patient.  pap smear 4/15. No pap today. Pt going to try Align for GI issues and if symptoms do not fully resolve, will plan PUS. Labs with Dr. Manus Gunning. Release signed so I can get copies. return annually or prn

## 2016-05-15 DIAGNOSIS — M1990 Unspecified osteoarthritis, unspecified site: Secondary | ICD-10-CM | POA: Diagnosis not present

## 2016-05-15 DIAGNOSIS — F411 Generalized anxiety disorder: Secondary | ICD-10-CM | POA: Diagnosis not present

## 2016-05-15 DIAGNOSIS — J309 Allergic rhinitis, unspecified: Secondary | ICD-10-CM | POA: Diagnosis not present

## 2016-05-15 DIAGNOSIS — N183 Chronic kidney disease, stage 3 (moderate): Secondary | ICD-10-CM | POA: Diagnosis not present

## 2016-05-15 DIAGNOSIS — I129 Hypertensive chronic kidney disease with stage 1 through stage 4 chronic kidney disease, or unspecified chronic kidney disease: Secondary | ICD-10-CM | POA: Diagnosis not present

## 2016-05-15 DIAGNOSIS — N3010 Interstitial cystitis (chronic) without hematuria: Secondary | ICD-10-CM | POA: Diagnosis not present

## 2016-05-15 DIAGNOSIS — R7301 Impaired fasting glucose: Secondary | ICD-10-CM | POA: Diagnosis not present

## 2016-05-15 DIAGNOSIS — Z23 Encounter for immunization: Secondary | ICD-10-CM | POA: Diagnosis not present

## 2016-05-15 DIAGNOSIS — M8580 Other specified disorders of bone density and structure, unspecified site: Secondary | ICD-10-CM | POA: Diagnosis not present

## 2016-05-15 DIAGNOSIS — Z0000 Encounter for general adult medical examination without abnormal findings: Secondary | ICD-10-CM | POA: Diagnosis not present

## 2016-05-15 DIAGNOSIS — I779 Disorder of arteries and arterioles, unspecified: Secondary | ICD-10-CM | POA: Diagnosis not present

## 2016-07-09 ENCOUNTER — Encounter: Payer: Self-pay | Admitting: Family

## 2016-07-14 ENCOUNTER — Emergency Department (HOSPITAL_COMMUNITY)
Admission: EM | Admit: 2016-07-14 | Discharge: 2016-07-14 | Disposition: A | Discharge status: Home / Self Care | Payer: Medicare Other | Attending: Emergency Medicine | Admitting: Emergency Medicine

## 2016-07-14 ENCOUNTER — Encounter (HOSPITAL_COMMUNITY): Payer: Self-pay

## 2016-07-14 ENCOUNTER — Emergency Department (HOSPITAL_COMMUNITY): Payer: Medicare Other

## 2016-07-14 DIAGNOSIS — I129 Hypertensive chronic kidney disease with stage 1 through stage 4 chronic kidney disease, or unspecified chronic kidney disease: Secondary | ICD-10-CM | POA: Insufficient documentation

## 2016-07-14 DIAGNOSIS — Z7982 Long term (current) use of aspirin: Secondary | ICD-10-CM | POA: Insufficient documentation

## 2016-07-14 DIAGNOSIS — K5730 Diverticulosis of large intestine without perforation or abscess without bleeding: Secondary | ICD-10-CM | POA: Diagnosis not present

## 2016-07-14 DIAGNOSIS — R109 Unspecified abdominal pain: Secondary | ICD-10-CM | POA: Diagnosis present

## 2016-07-14 DIAGNOSIS — K5732 Diverticulitis of large intestine without perforation or abscess without bleeding: Secondary | ICD-10-CM | POA: Diagnosis not present

## 2016-07-14 DIAGNOSIS — N183 Chronic kidney disease, stage 3 (moderate): Secondary | ICD-10-CM | POA: Diagnosis not present

## 2016-07-14 DIAGNOSIS — Z79899 Other long term (current) drug therapy: Secondary | ICD-10-CM | POA: Insufficient documentation

## 2016-07-14 LAB — CBC WITH DIFFERENTIAL/PLATELET
BASOS ABS: 0 10*3/uL (ref 0.0–0.1)
BASOS PCT: 0 %
Eosinophils Absolute: 0 10*3/uL (ref 0.0–0.7)
Eosinophils Relative: 0 %
HEMATOCRIT: 37.2 % (ref 36.0–46.0)
Hemoglobin: 12.5 g/dL (ref 12.0–15.0)
Lymphocytes Relative: 13 %
Lymphs Abs: 2.3 10*3/uL (ref 0.7–4.0)
MCH: 29.8 pg (ref 26.0–34.0)
MCHC: 33.6 g/dL (ref 30.0–36.0)
MCV: 88.8 fL (ref 78.0–100.0)
Monocytes Absolute: 1.8 10*3/uL — ABNORMAL HIGH (ref 0.1–1.0)
Monocytes Relative: 10 %
NEUTROS ABS: 13.5 10*3/uL — AB (ref 1.7–7.7)
NEUTROS PCT: 77 %
PLATELETS: 209 10*3/uL (ref 150–400)
RBC: 4.19 MIL/uL (ref 3.87–5.11)
RDW: 12.9 % (ref 11.5–15.5)
WBC: 17.5 10*3/uL — AB (ref 4.0–10.5)

## 2016-07-14 LAB — URINE MICROSCOPIC-ADD ON

## 2016-07-14 LAB — I-STAT CHEM 8, ED
BUN: 17 mg/dL (ref 6–20)
CHLORIDE: 98 mmol/L — AB (ref 101–111)
Calcium, Ion: 1.14 mmol/L — ABNORMAL LOW (ref 1.15–1.40)
Creatinine, Ser: 1.1 mg/dL — ABNORMAL HIGH (ref 0.44–1.00)
GLUCOSE: 119 mg/dL — AB (ref 65–99)
HEMATOCRIT: 39 % (ref 36.0–46.0)
Hemoglobin: 13.3 g/dL (ref 12.0–15.0)
POTASSIUM: 3.4 mmol/L — AB (ref 3.5–5.1)
Sodium: 137 mmol/L (ref 135–145)
TCO2: 25 mmol/L (ref 0–100)

## 2016-07-14 LAB — URINALYSIS, ROUTINE W REFLEX MICROSCOPIC
Bilirubin Urine: NEGATIVE
Glucose, UA: NEGATIVE mg/dL
Ketones, ur: NEGATIVE mg/dL
Nitrite: NEGATIVE
PROTEIN: NEGATIVE mg/dL
SPECIFIC GRAVITY, URINE: 1.017 (ref 1.005–1.030)
pH: 5.5 (ref 5.0–8.0)

## 2016-07-14 MED ORDER — IOPAMIDOL (ISOVUE-300) INJECTION 61%
INTRAVENOUS | Status: AC
  Filled 2016-07-14: qty 100

## 2016-07-14 MED ORDER — CIPROFLOXACIN HCL 500 MG PO TABS: 500.0000 mg | ORAL_TABLET | Freq: Two times a day (BID) | ORAL | 0 refills | Status: DC

## 2016-07-14 MED ORDER — SODIUM CHLORIDE 0.9 % IJ SOLN
INTRAMUSCULAR | Status: AC
  Filled 2016-07-14: qty 50

## 2016-07-14 MED ORDER — ONDANSETRON HCL 4 MG/2ML IJ SOLN
4.0000 mg | Freq: Once | INTRAMUSCULAR | Status: DC
  Filled 2016-07-14: qty 2

## 2016-07-14 MED ORDER — METRONIDAZOLE 500 MG PO TABS: 500.0000 mg | ORAL_TABLET | Freq: Three times a day (TID) | ORAL | 0 refills | Status: DC

## 2016-07-14 MED ORDER — IOPAMIDOL (ISOVUE-300) INJECTION 61%
100.0000 mL | Freq: Once | INTRAVENOUS | Status: AC | PRN
  Administered 2016-07-14: 100 mL via INTRAVENOUS

## 2016-07-14 NOTE — ED Provider Notes (Signed)
[facility]-EMERGENCY DEPT Provider Note   CSN: [id number] Arrival date & time: 07/14/16  1335     History   Chief Complaint Chief Complaint  Patient presents with  . Abdominal Pain  . Diarrhea    HPI Amber Barry is a 75 y.o. female.  HPI Patient [facility]. Has had constipation for the last few days and then turned over diarrhea. She used to have problems with this but it had improved initially was started on a probiotic. No fevers. She's had some mild nausea. States she now is watery diarrhea. Lab work done at the urgent care showed a white count of 17. No dysuria. Belly may be slightly swollen. No blood in the stool. Sent in for further evaluation.   Past Medical History:  Diagnosis Date  . Anemia   . Anxiety   . Arthritis    Osteoarthritis  . Arthritis    ? diag in lower back  . Bursitis    left hip-had injection 2015  . Carotid artery occlusion April 2014   Left Bruit, 80% blockage  . CKD (chronic kidney disease)    Stage III  . Hematuria   . Hypertension   . Interstitial cystitis   . Lower back pain June 2015  . Ocular migraine   . Osteopenia   . PONV (postoperative nausea and vomiting)     Patient Active Problem List   Diagnosis Date Noted  . Aftercare following surgery of the circulatory system, NEC 01/04/2014  . Unspecified constipation 12/05/2013  . Occlusion and stenosis of carotid artery without mention of cerebral infarction 11/30/2012    Past Surgical History:  Procedure Laterality Date  . CAROTID ENDARTERECTOMY Right 12-22-12   cea  . ENDARTERECTOMY Right 12/22/2012   Procedure: Right Carotid Endarterectomy with hemashield patch angioplasty;  Surgeon: Pryor OchoaJames D Lawson, MD;  [facility];  Service: Vascular;  Laterality: Right;    OB History    Gravida Para Term Preterm AB Living   2 2       2    SAB TAB Ectopic Multiple Live Births                   Home Medications    Prior to Admission medications    Medication Sig Start Date End Date Taking? Authorizing Provider  acetaminophen (TYLENOL) 500 MG tablet Take 500 mg by mouth every 6 (six) hours as needed.   Yes Historical Provider, MD  ALPRAZolam (XANAX) 0.25 MG tablet Take 0.125 mg by mouth 2 (two) times daily as needed for anxiety.    Yes Historical Provider, MD  aspirin EC 81 MG tablet Take 243 mg by mouth daily. Pt  Takes x's 3  81 mg tabs per day   Yes Historical Provider, MD  azelastine (ASTELIN) 0.1 % nasal spray Place 2 sprays into both nostrils 2 (two) times daily. As needed 11/15/14  Yes Historical Provider, MD  Calcium-Vitamin D (CALTRATE 600 PLUS-VIT D PO) Take 1 tablet by mouth daily at 12 noon.   Yes Historical Provider, MD  Celecoxib (CELEBREX PO) Take 200 mg by mouth daily as needed (pain). Not more than 2 per week   Yes Historical Provider, MD  Cholecalciferol (VITAMIN D) 2000 UNITS CAPS Take 2,000 Units by mouth daily at 12 noon.    Yes Historical Provider, MD  hydrochlorothiazide (HYDRODIURIL) 25 MG tablet Take 25 mg by mouth daily.  11/15/14  Yes Historical Provider, MD  moexipril (UNIVASC) 15  MG tablet Take 15 mg by mouth daily.  11/15/14  Yes Historical Provider, MD  Probiotic Product (ALIGN) 4 MG CAPS Take 4 mg by mouth daily.   Yes Historical Provider, MD  Pseudoephedrine HCl (SUDAFED PO) Take 30 mg by mouth as needed (allergies).    Yes Historical Provider, MD  ciprofloxacin (CIPRO) 500 MG tablet Take 1 tablet (500 mg total) by mouth 2 (two) times daily. 07/14/16   Benjiman CoreNathan Maci Eickholt, MD  metroNIDAZOLE (FLAGYL) 500 MG tablet Take 1 tablet (500 mg total) by mouth 3 (three) times daily. 07/14/16   Benjiman CoreNathan Angeliyah Kirkey, MD    Family History Family History  Problem Relation Age of Onset  . COPD Mother     Respiratory Disease  . Rheumatic fever Mother   . Pulmonary disease Mother   . Heart attack Mother   . Stroke Father   . Hypertension Father   . Diabetes Paternal Aunt   . Prostate cancer Maternal Grandfather   . Hypertension  Paternal Aunt   . Hypertension Paternal Grandmother   . Other Maternal Grandmother     hardening arteries  . Stroke Other     paternal side    Social History Social History  Substance Use Topics  . Smoking status: Never Smoker  . Smokeless tobacco: Never Used  . Alcohol use No     Allergies   Benicar hct [olmesartan medoxomil-hctz]; Buspar [buspirone]; Norvasc [amlodipine besylate]; Zoloft [sertraline hcl]; Lipitor [atorvastatin]; Lisinopril; Tylenol [acetaminophen]; Amitriptyline hcl; and Pravastatin   Review of Systems Review of Systems  Constitutional: Positive for appetite change.  HENT: Negative for congestion.   Respiratory: Negative for shortness of breath.   Cardiovascular: Negative for chest pain.  Gastrointestinal: Positive for abdominal pain, constipation, diarrhea and nausea.  Genitourinary: Negative for flank pain.  Musculoskeletal: Negative for back pain.  Skin: Negative for wound.  Neurological: Negative for numbness.  Hematological: Negative for adenopathy.  Psychiatric/Behavioral: Negative for confusion.     Physical Exam Updated Vital Signs BP 129/61   Pulse 88   Temp 98.2 F (36.8 C) (Oral)   Resp 16   Ht 4\' 11"  (1.499 m)   Wt 121 lb (54.9 kg)   LMP 08/11/2002   SpO2 98%   BMI 24.44 kg/m   Physical Exam  Constitutional: She appears well-developed.  HENT:  Head: Atraumatic.  Neck: Neck supple.  Cardiovascular: Normal rate.   Pulmonary/Chest: Effort normal.  Abdominal: She exhibits distension.  Mild distention with lower abdominal tenderness without hernias. Some fullness. No rebound or guarding.  Musculoskeletal: She exhibits no edema.  Neurological: She is alert.  Skin: Skin is warm. Capillary refill takes less than 2 seconds.  Psychiatric: She has a normal mood and affect.     ED Treatments / Results  Labs (all labs ordered are listed, but only abnormal results are displayed) Labs Reviewed  CBC WITH DIFFERENTIAL/PLATELET -  Abnormal; Notable for the following:       Result Value   WBC 17.5 (*)    Neutro Abs 13.5 (*)    Monocytes Absolute 1.8 (*)    All other components within normal limits  URINALYSIS, ROUTINE W REFLEX MICROSCOPIC (NOT AT [facility]) - Abnormal; Notable for the following:    APPearance CLOUDY (*)    Hgb urine dipstick SMALL (*)    Leukocytes, UA LARGE (*)    All other components within normal limits  URINE MICROSCOPIC-ADD ON - Abnormal; Notable for the following:    Squamous Epithelial / LPF 0-5 (*)  Bacteria, UA FEW (*)    All other components within normal limits  I-STAT CHEM 8, ED - Abnormal; Notable for the following:    Potassium 3.4 (*)    Chloride 98 (*)    Creatinine, Ser 1.10 (*)    Glucose, Bld 119 (*)    Calcium, Ion 1.14 (*)    All other components within normal limits    EKG  EKG Interpretation None       Radiology Ct Abdomen Pelvis W Contrast  Result Date: 07/14/2016 CLINICAL DATA:  Acute abdominal pain. EXAM: CT ABDOMEN AND PELVIS WITH CONTRAST TECHNIQUE: Multidetector CT imaging of the abdomen and pelvis was performed using the standard protocol following bolus administration of intravenous contrast. CONTRAST:  ISOVUE-300 IOPAMIDOL (ISOVUE-300) INJECTION 61% COMPARISON:  Abdominal radiograph 07/14/2016 FINDINGS: Lower chest: No acute abnormality. Hepatobiliary: No focal liver abnormality is seen. No gallstones, gallbladder wall thickening, or biliary dilatation. Pancreas: Unremarkable. No pancreatic ductal dilatation or surrounding inflammatory changes. Spleen: Normal in size without focal abnormality. Adrenals/Urinary Tract: Adrenal glands are unremarkable. Kidneys are normal, without renal calculi, focal lesion, or hydronephrosis. Bladder is unremarkable. Stomach/Bowel: Stomach is within normal limits. There is no evidence of appendicitis. No evidence of bowel obstruction. There is a diffuse mucosal thickening of the relatively long segment of the sigmoid colon with  pericolonic mesenteric inflammatory changes. Scattered diverticular are seen in the sigmoid colon. There is no evidence of perforation or abscess formation. Vascular/Lymphatic: Mild aortic atherosclerosis. No enlarged abdominal or pelvic lymph nodes. Reproductive: The uterus is unremarkable. No evidence of right adnexal masses. There is a 3.1 cm left adnexal cyst. Other: Minimal free fluid in the pelvis. Musculoskeletal: Sclerotic L5 pars articularis defects with grade 1 anterolisthesis of L5 on S1 and calcified anterior disc extrusion. Otherwise multilevel osteoarthritic changes of the spine. IMPRESSION: Diffuse mucosal thickening with pericolonic inflammatory changes of relatively long segment of the sigmoid colon, on the background of scattered colonic diverticulosis. This may represent focal colitis, potentially diverticulitis, however adenocarcinoma of the sigmoid colon cannot be excluded. Colonoscopy after resolution of the acute symptoms is recommended to exclude underlying malignancy. 3.1 cm left adnexal cyst. Further follow-up with pelvic ultrasound may be considered. Electronically Signed   By: Ted Mcalpine M.D.   On: 07/14/2016 16:55    Procedures Procedures (including critical care time)  Medications Ordered in ED Medications  ondansetron (ZOFRAN) injection 4 mg (4 mg Intravenous Refused 07/14/16 1530)  iopamidol (ISOVUE-300) 61 % injection (not administered)  sodium chloride 0.9 % injection (not administered)  iopamidol (ISOVUE-300) 61 % injection 100 mL (100 mLs Intravenous Contrast Given 07/14/16 1632)     Initial Impression / Assessment and Plan / ED Course  I have reviewed the triage vital signs and the nursing notes.  Pertinent labs & imaging results that were available during my care of the patient were reviewed by me and considered in my medical decision making (see chart for details).  Clinical Course     patietn With abdominal pain. Found to have likely  diverticulitis on CT. White count is elevated patient overall feels good. States she feels if she can tolerate oral pain. Will discharge home with antibiotics. Also has adnexal cyst will need to get followed. Discharge.   Final Clinical Impressions(s) / ED Diagnoses   Final diagnoses:  Diverticulitis of large intestine without perforation or abscess without bleeding    New Prescriptions New Prescriptions   CIPROFLOXACIN (CIPRO) 500 MG TABLET    Take 1 tablet (500 mg  total) by mouth 2 (two) times daily.   METRONIDAZOLE (FLAGYL) 500 MG TABLET    Take 1 tablet (500 mg total) by mouth 3 (three) times daily.     Benjiman Core, MD 07/14/16 1743

## 2016-07-14 NOTE — Discharge Instructions (Signed)
Follow-up through primary care doctor for further management. Also follow-up with your gynecologist for the adnexal cyst.

## 2016-07-14 NOTE — ED Notes (Signed)
Sent by [facility]. [facility] to send paperwork.

## 2016-07-14 NOTE — ED Notes (Signed)
Patient is A & O x4.  She understood discharge instructions. 

## 2016-07-16 ENCOUNTER — Encounter: Payer: Self-pay | Admitting: Family

## 2016-07-16 ENCOUNTER — Ambulatory Visit (HOSPITAL_COMMUNITY)
Admission: RE | Admit: 2016-07-16 | Discharge: 2016-07-16 | Disposition: A | Discharge status: Home / Self Care | Payer: Medicare Other | Source: Ambulatory Visit | Attending: Family | Admitting: Family

## 2016-07-16 ENCOUNTER — Ambulatory Visit (INDEPENDENT_AMBULATORY_CARE_PROVIDER_SITE_OTHER): Payer: Medicare Other | Admitting: Family

## 2016-07-16 VITALS — BP 114/66 | HR 78 | Temp 97.4°F | Resp 18 | Ht 59.0 in | Wt 119.5 lb

## 2016-07-16 DIAGNOSIS — I6529 Occlusion and stenosis of unspecified carotid artery: Secondary | ICD-10-CM

## 2016-07-16 DIAGNOSIS — Z9889 Other specified postprocedural states: Secondary | ICD-10-CM | POA: Diagnosis not present

## 2016-07-16 DIAGNOSIS — I6521 Occlusion and stenosis of right carotid artery: Secondary | ICD-10-CM

## 2016-07-16 NOTE — Patient Instructions (Signed)
Stroke Prevention Some medical conditions and behaviors are associated with an increased chance of having a stroke. You may prevent a stroke by making healthy choices and managing medical conditions. How can I reduce my risk of having a stroke?  Stay physically active. Get at least 30 minutes of activity on most or all days.  Do not smoke. It may also be helpful to avoid exposure to secondhand smoke.  Limit alcohol use. Moderate alcohol use is considered to be:  No more than 2 drinks per day for men.  No more than 1 drink per day for nonpregnant women.  Eat healthy foods. This involves:  Eating 5 or more servings of fruits and vegetables a day.  Making dietary changes that address high blood pressure (hypertension), high cholesterol, diabetes, or obesity.  Manage your cholesterol levels.  Making food choices that are high in fiber and low in saturated fat, trans fat, and cholesterol may control cholesterol levels.  Take any prescribed medicines to control cholesterol as directed by your health care provider.  Manage your diabetes.  Controlling your carbohydrate and sugar intake is recommended to manage diabetes.  Take any prescribed medicines to control diabetes as directed by your health care provider.  Control your hypertension.  Making food choices that are low in salt (sodium), saturated fat, trans fat, and cholesterol is recommended to manage hypertension.  Ask your health care provider if you need treatment to lower your blood pressure. Take any prescribed medicines to control hypertension as directed by your health care provider.  If you are 18-39 years of age, have your blood pressure checked every 3-5 years. If you are 40 years of age or older, have your blood pressure checked every year.  Maintain a healthy weight.  Reducing calorie intake and making food choices that are low in sodium, saturated fat, trans fat, and cholesterol are recommended to manage  weight.  Stop drug abuse.  Avoid taking birth control pills.  Talk to your health care provider about the risks of taking birth control pills if you are over 35 years old, smoke, get migraines, or have ever had a blood clot.  Get evaluated for sleep disorders (sleep apnea).  Talk to your health care provider about getting a sleep evaluation if you snore a lot or have excessive sleepiness.  Take medicines only as directed by your health care provider.  For some people, aspirin or blood thinners (anticoagulants) are helpful in reducing the risk of forming abnormal blood clots that can lead to stroke. If you have the irregular heart rhythm of atrial fibrillation, you should be on a blood thinner unless there is a good reason you cannot take them.  Understand all your medicine instructions.  Make sure that other conditions (such as anemia or atherosclerosis) are addressed. Get help right away if:  You have sudden weakness or numbness of the face, arm, or leg, especially on one side of the body.  Your face or eyelid droops to one side.  You have sudden confusion.  You have trouble speaking (aphasia) or understanding.  You have sudden trouble seeing in one or both eyes.  You have sudden trouble walking.  You have dizziness.  You have a loss of balance or coordination.  You have a sudden, severe headache with no known cause.  You have new chest pain or an irregular heartbeat. Any of these symptoms may represent a serious problem that is an emergency. Do not wait to see if the symptoms will go away.   Get medical help at once. Call your local emergency services (911 in [place] Do not drive yourself to the hospital. This information is not intended to replace advice given to you by your health care provider. Make sure you discuss any questions you have with your health care provider. Document Released: 09/04/2004 Document Revised: 01/03/2016 Document Reviewed: 01/28/2013 Elsevier  Interactive Patient Education  2017 Elsevier Inc.  

## 2016-07-16 NOTE — Progress Notes (Signed)
Chief Complaint: Follow up Extracranial Carotid Artery Stenosis   History of Present Illness  Amber Barry is a 75 y.o. female patient of Dr. Hart Rochester who is s/p right CEA in May, 2014. She returns today for routine surveillance.  The patient has no history of TIA or stroke symptoms. Specifically she denies a history of amaurosis fugax or monocular blindness, unilateral facial drooping, hemiplegia, or receptive or expressive aphasia. She denies pain, weakness, tingling, or numbness in either hand or arm.  The patient denies a history of cardiac problems, denies claudication symptoms, denies non-healing wounds.  The patient denies any GI bleeding history, denies easy bruising.  She reports that her maternal grandmother had "hardening of the arteries" and her father died of a stroke, he had several strokes.   MRI of her back showed arthritis in her spine, pt had physical therapy, and the pain in her legs improved, states she does not need back surgery at this point.  She is currently taking antibx for diverticulitis, feels better than a few days ago.   Pt Diabetic: No  Pt smoker: non-smoker   Pt meds include:  Statin : No: is atorvastatin and pravastatin intolerant, causes lethargy ASA: Yes, 81 mg 3x/day Other anticoagulants/antiplatelets: no   Past Medical History:  Diagnosis Date  . Anemia   . Anxiety   . Arthritis    Osteoarthritis  . Arthritis    ? diag in lower back  . Bursitis    left hip-had injection 2015  . Carotid artery occlusion April 2014   Left Bruit, 80% blockage  . CKD (chronic kidney disease)    Stage III  . Diverticulitis   . Hematuria   . Hypertension   . Interstitial cystitis   . Lower back pain June 2015  . Ocular migraine   . Osteopenia   . PONV (postoperative nausea and vomiting)     Social History Social History  Substance Use Topics  . Smoking status: Never Smoker  . Smokeless tobacco: Never Used  . Alcohol use No     Family History Family History  Problem Relation Age of Onset  . COPD Mother     Respiratory Disease  . Rheumatic fever Mother   . Pulmonary disease Mother   . Heart attack Mother   . Stroke Father   . Hypertension Father   . Diabetes Paternal Aunt   . Prostate cancer Maternal Grandfather   . Hypertension Paternal Aunt   . Hypertension Paternal Grandmother   . Other Maternal Grandmother     hardening arteries  . Stroke Other     paternal side    Surgical History Past Surgical History:  Procedure Laterality Date  . CAROTID ENDARTERECTOMY Right 12-22-12   cea  . ENDARTERECTOMY Right 12/22/2012   Procedure: Right Carotid Endarterectomy with hemashield patch angioplasty;  Surgeon: Pryor Ochoa, MD;  Location: [facility] OR;  Service: Vascular;  Laterality: Right;    Allergies  Allergen Reactions  . Benicar Hct [Olmesartan Medoxomil-Hctz] Swelling    Facial swelling  . Buspar [Buspirone]     Facial Dysesthesia  . Norvasc [Amlodipine Besylate] Swelling    legs  . Zoloft [Sertraline Hcl] Anxiety    Increased anxiety, dysesthesia  . Lipitor [Atorvastatin] Other (See Comments)    Generalized muscle aches  . Lisinopril Swelling    Around eyes  . Tylenol [Acetaminophen] Nausea Only    Nausea with some /most pain meds- aspirin Patient states will take this as needed  . Amitriptyline  Hcl Anxiety    And Paresthesias  . Pravastatin Anxiety    Pt. Wanted to sleep, no energy.    Current Outpatient Prescriptions  Medication Sig Dispense Refill  . acetaminophen (TYLENOL) 500 MG tablet Take 500 mg by mouth every 6 (six) hours as needed.    . ALPRAZolam (XANAX) 0.25 MG tablet Take 0.125 mg by mouth 2 (two) times daily as needed for anxiety.     Marland Kitchen. aspirin EC 81 MG tablet Take 243 mg by mouth daily. Pt  Takes x's 3  81 mg tabs per day    . azelastine (ASTELIN) 0.1 % nasal spray Place 2 sprays into both nostrils 2 (two) times daily. As needed  12  . Calcium-Vitamin D (CALTRATE 600  PLUS-VIT D PO) Take 1 tablet by mouth daily at 12 noon.    . Celecoxib (CELEBREX PO) Take 200 mg by mouth daily as needed (pain). Not more than 2 per week    . Cholecalciferol (VITAMIN D) 2000 UNITS CAPS Take 2,000 Units by mouth daily at 12 noon.     . ciprofloxacin (CIPRO) 500 MG tablet Take 1 tablet (500 mg total) by mouth 2 (two) times daily. 20 tablet 0  . hydrochlorothiazide (HYDRODIURIL) 25 MG tablet Take 25 mg by mouth daily.   3  . metroNIDAZOLE (FLAGYL) 500 MG tablet Take 1 tablet (500 mg total) by mouth 3 (three) times daily. 30 tablet 0  . moexipril (UNIVASC) 15 MG tablet Take 15 mg by mouth daily.   3  . Probiotic Product (ALIGN) 4 MG CAPS Take 4 mg by mouth daily.    . Pseudoephedrine HCl (SUDAFED PO) Take 30 mg by mouth as needed (allergies).      No current facility-administered medications for this visit.     Review of Systems : See HPI for pertinent positives and negatives.  Physical Examination  Vitals:   07/16/16 1249 07/16/16 1253  BP: 109/63 114/66  Pulse: 78   Resp: 18   Temp: 97.4 F (36.3 C)   TempSrc: Oral   SpO2: 99%   Weight: 119 lb 8 oz (54.2 kg)   Height: 4\' 11"  (1.499 m)    Body mass index is 24.14 kg/m.  General: WDWN female in NAD  GAIT: normal  Eyes: PERRLA  Pulmonary: Respirations are non labored, CTAB, no rales,  rhonchi, or wheezing.  Cardiac: regular rhythm, no detected murmur.   VASCULAR EXAM  Carotid Bruits  Left  Right    Negative  Negative   Radial pulses are 2+ palpable and equal.  Aorta is not palpable  LE Pulses  LEFT  RIGHT   POPLITEAL  not palpable  not palpable   POSTERIOR TIBIAL  palpable  palpable   DORSALIS PEDIS  ANTERIOR TIBIAL  2+ palpable  2+palpable     Gastrointestinal: soft, mild generalized tenderness to palpation, BS WNL, no r/g, no palpated masses.  Musculoskeletal: No muscle atrophy/wasting. M/S 5/5 throughout, Extremities without ischemic changes.   Neurologic: A&O X 3; Appropriate Affect, sensation is normal,  Speech is normal  CN 2-12 intact except has some hearing loss, is wearing hearing aids, pain and light touch intact in extremities, Motor exam as listed above.     Assessment: Amber Barry is a 75 y.o. female who is s/p right CEA in May, 2014. She has no history of stroke or TIA.  DATA  Today's carotid duplex suggests a patent right carotid endarterectomy site with evidence of restenosis at the proximal patch site  with extension into the surgical bulb; velocities suggest 60-79% stenosis.  Left ICA with <40% stenosis. Bilateral vertebral artery flow is antegrade. Bilateral subclavian artery waveforms are normal. Disease progression of the right endarterectomy site.   Plan: Follow-up in 6 months with Carotid Duplex scan.   I discussed in depth with the patient the nature of atherosclerosis, and emphasized the importance of maximal medical management including strict control of blood pressure, blood glucose, and lipid levels, obtaining regular exercise, and continued cessation of smoking.  The patient is aware that without maximal medical management the underlying atherosclerotic disease process will progress, limiting the benefit of any interventions. The patient was given information about stroke prevention and what symptoms should prompt the patient to seek immediate medical care. Thank you for allowing us to participate in this patient's care.  Charisse MarchSuzanne Ranveer Wahlstrom, RN, MSN, FNP-C [facility] Office: [phone]  Clinic Physician: Edilia BoDickson  07/16/16 12:57 PM

## 2016-07-25 DIAGNOSIS — K5730 Diverticulosis of large intestine without perforation or abscess without bleeding: Secondary | ICD-10-CM | POA: Diagnosis not present

## 2016-07-25 DIAGNOSIS — Z1211 Encounter for screening for malignant neoplasm of colon: Secondary | ICD-10-CM | POA: Diagnosis not present

## 2016-07-25 DIAGNOSIS — N839 Noninflammatory disorder of ovary, fallopian tube and broad ligament, unspecified: Secondary | ICD-10-CM | POA: Diagnosis not present

## 2016-07-28 ENCOUNTER — Other Ambulatory Visit: Payer: Self-pay | Admitting: Family Medicine

## 2016-07-28 DIAGNOSIS — N838 Other noninflammatory disorders of ovary, fallopian tube and broad ligament: Secondary | ICD-10-CM

## 2016-08-01 DIAGNOSIS — R197 Diarrhea, unspecified: Secondary | ICD-10-CM | POA: Diagnosis not present

## 2016-08-06 ENCOUNTER — Ambulatory Visit
Admission: RE | Admit: 2016-08-06 | Discharge: 2016-08-06 | Disposition: A | Discharge status: Home / Self Care | Payer: Medicare Other | Source: Ambulatory Visit | Attending: Family Medicine | Admitting: Family Medicine

## 2016-08-06 DIAGNOSIS — M79675 Pain in left toe(s): Secondary | ICD-10-CM | POA: Diagnosis not present

## 2016-08-06 DIAGNOSIS — N838 Other noninflammatory disorders of ovary, fallopian tube and broad ligament: Secondary | ICD-10-CM

## 2016-08-06 DIAGNOSIS — N858 Other specified noninflammatory disorders of uterus: Secondary | ICD-10-CM | POA: Diagnosis not present

## 2016-08-22 DIAGNOSIS — H2513 Age-related nuclear cataract, bilateral: Secondary | ICD-10-CM | POA: Diagnosis not present

## 2016-08-22 DIAGNOSIS — H40053 Ocular hypertension, bilateral: Secondary | ICD-10-CM | POA: Diagnosis not present

## 2016-08-22 DIAGNOSIS — H5203 Hypermetropia, bilateral: Secondary | ICD-10-CM | POA: Diagnosis not present

## 2016-10-13 DIAGNOSIS — K5792 Diverticulitis of intestine, part unspecified, without perforation or abscess without bleeding: Secondary | ICD-10-CM | POA: Diagnosis not present

## 2016-11-17 DIAGNOSIS — K5730 Diverticulosis of large intestine without perforation or abscess without bleeding: Secondary | ICD-10-CM | POA: Diagnosis not present

## 2016-11-17 DIAGNOSIS — K5732 Diverticulitis of large intestine without perforation or abscess without bleeding: Secondary | ICD-10-CM | POA: Diagnosis not present

## 2016-12-10 DIAGNOSIS — I779 Disorder of arteries and arterioles, unspecified: Secondary | ICD-10-CM | POA: Diagnosis not present

## 2016-12-10 DIAGNOSIS — R7301 Impaired fasting glucose: Secondary | ICD-10-CM | POA: Diagnosis not present

## 2016-12-10 DIAGNOSIS — I129 Hypertensive chronic kidney disease with stage 1 through stage 4 chronic kidney disease, or unspecified chronic kidney disease: Secondary | ICD-10-CM | POA: Diagnosis not present

## 2016-12-10 DIAGNOSIS — N183 Chronic kidney disease, stage 3 (moderate): Secondary | ICD-10-CM | POA: Diagnosis not present

## 2016-12-10 DIAGNOSIS — N3010 Interstitial cystitis (chronic) without hematuria: Secondary | ICD-10-CM | POA: Diagnosis not present

## 2016-12-10 DIAGNOSIS — Z23 Encounter for immunization: Secondary | ICD-10-CM | POA: Diagnosis not present

## 2016-12-10 DIAGNOSIS — J309 Allergic rhinitis, unspecified: Secondary | ICD-10-CM | POA: Diagnosis not present

## 2016-12-10 DIAGNOSIS — M8580 Other specified disorders of bone density and structure, unspecified site: Secondary | ICD-10-CM | POA: Diagnosis not present

## 2016-12-10 DIAGNOSIS — F411 Generalized anxiety disorder: Secondary | ICD-10-CM | POA: Diagnosis not present

## 2016-12-10 DIAGNOSIS — M1990 Unspecified osteoarthritis, unspecified site: Secondary | ICD-10-CM | POA: Diagnosis not present

## 2016-12-19 ENCOUNTER — Other Ambulatory Visit: Payer: Self-pay | Admitting: Obstetrics & Gynecology

## 2016-12-19 DIAGNOSIS — Z1231 Encounter for screening mammogram for malignant neoplasm of breast: Secondary | ICD-10-CM

## 2017-01-14 ENCOUNTER — Ambulatory Visit
Admission: RE | Admit: 2017-01-14 | Discharge: 2017-01-14 | Disposition: A | Discharge status: Home / Self Care | Payer: Medicare Other | Source: Ambulatory Visit | Attending: Obstetrics & Gynecology | Admitting: Obstetrics & Gynecology

## 2017-01-14 DIAGNOSIS — Z1231 Encounter for screening mammogram for malignant neoplasm of breast: Secondary | ICD-10-CM

## 2017-01-23 ENCOUNTER — Ambulatory Visit: Payer: Medicare Other | Admitting: Family

## 2017-01-23 ENCOUNTER — Encounter (HOSPITAL_COMMUNITY): Payer: Medicare Other

## 2017-01-28 ENCOUNTER — Encounter: Payer: Self-pay | Admitting: Family

## 2017-01-28 ENCOUNTER — Ambulatory Visit (HOSPITAL_COMMUNITY)
Admission: RE | Admit: 2017-01-28 | Discharge: 2017-01-28 | Disposition: A | Discharge status: Home / Self Care | Payer: Medicare Other | Source: Ambulatory Visit | Attending: Vascular Surgery | Admitting: Vascular Surgery

## 2017-01-28 ENCOUNTER — Ambulatory Visit (INDEPENDENT_AMBULATORY_CARE_PROVIDER_SITE_OTHER): Payer: Medicare Other | Admitting: Family

## 2017-01-28 VITALS — BP 148/71 | HR 79 | Temp 98.9°F | Resp 16 | Ht 59.0 in | Wt 118.8 lb

## 2017-01-28 DIAGNOSIS — Z9889 Other specified postprocedural states: Secondary | ICD-10-CM | POA: Insufficient documentation

## 2017-01-28 DIAGNOSIS — I6521 Occlusion and stenosis of right carotid artery: Secondary | ICD-10-CM | POA: Diagnosis not present

## 2017-01-28 LAB — VAS US CAROTID
LCCAPDIAS: 32 cm/s
LEFT ECA DIAS: 13 cm/s
LEFT VERTEBRAL DIAS: 11 cm/s
LICAPDIAS: -35 cm/s
Left CCA dist dias: 24 cm/s
Left CCA dist sys: 126 cm/s
Left CCA prox sys: 169 cm/s
Left ICA prox sys: -154 cm/s
RCCAPDIAS: 21 cm/s
RIGHT ECA DIAS: -11 cm/s
RIGHT VERTEBRAL DIAS: 16 cm/s
Right CCA prox sys: 82 cm/s
Right cca dist sys: -84 cm/s

## 2017-01-28 NOTE — Patient Instructions (Signed)
Stroke Prevention Some medical conditions and behaviors are associated with an increased chance of having a stroke. You may prevent a stroke by making healthy choices and managing medical conditions. How can I reduce my risk of having a stroke?  Stay physically active. Get at least 30 minutes of activity on most or all days.  Do not smoke. It may also be helpful to avoid exposure to secondhand smoke.  Limit alcohol use. Moderate alcohol use is considered to be: ? No more than 2 drinks per day for men. ? No more than 1 drink per day for nonpregnant women.  Eat healthy foods. This involves: ? Eating 5 or more servings of fruits and vegetables a day. ? Making dietary changes that address high blood pressure (hypertension), high cholesterol, diabetes, or obesity.  Manage your cholesterol levels. ? Making food choices that are high in fiber and low in saturated fat, trans fat, and cholesterol may control cholesterol levels. ? Take any prescribed medicines to control cholesterol as directed by your health care provider.  Manage your diabetes. ? Controlling your carbohydrate and sugar intake is recommended to manage diabetes. ? Take any prescribed medicines to control diabetes as directed by your health care provider.  Control your hypertension. ? Making food choices that are low in salt (sodium), saturated fat, trans fat, and cholesterol is recommended to manage hypertension. ? Ask your health care provider if you need treatment to lower your blood pressure. Take any prescribed medicines to control hypertension as directed by your health care provider. ? If you are 18-39 years of age, have your blood pressure checked every 3-5 years. If you are 40 years of age or older, have your blood pressure checked every year.  Maintain a healthy weight. ? Reducing calorie intake and making food choices that are low in sodium, saturated fat, trans fat, and cholesterol are recommended to manage  weight.  Stop drug abuse.  Avoid taking birth control pills. ? Talk to your health care provider about the risks of taking birth control pills if you are over 35 years old, smoke, get migraines, or have ever had a blood clot.  Get evaluated for sleep disorders (sleep apnea). ? Talk to your health care provider about getting a sleep evaluation if you snore a lot or have excessive sleepiness.  Take medicines only as directed by your health care provider. ? For some people, aspirin or blood thinners (anticoagulants) are helpful in reducing the risk of forming abnormal blood clots that can lead to stroke. If you have the irregular heart rhythm of atrial fibrillation, you should be on a blood thinner unless there is a good reason you cannot take them. ? Understand all your medicine instructions.  Make sure that other conditions (such as anemia or atherosclerosis) are addressed. Get help right away if:  You have sudden weakness or numbness of the face, arm, or leg, especially on one side of the body.  Your face or eyelid droops to one side.  You have sudden confusion.  You have trouble speaking (aphasia) or understanding.  You have sudden trouble seeing in one or both eyes.  You have sudden trouble walking.  You have dizziness.  You have a loss of balance or coordination.  You have a sudden, severe headache with no known cause.  You have new chest pain or an irregular heartbeat. Any of these symptoms may represent a serious problem that is an emergency. Do not wait to see if the symptoms will go away.   Get medical help at once. Call your local emergency services (911 in [place] Do not drive yourself to the hospital. This information is not intended to replace advice given to you by your health care provider. Make sure you discuss any questions you have with your health care provider. Document Released: 09/04/2004 Document Revised: 01/03/2016 Document Reviewed: 01/28/2013 Elsevier  Interactive Patient Education  2017 Elsevier Inc.     Preventing Cerebrovascular Disease Arteries are blood vessels that carry blood that contains oxygen from the heart to all parts of the body. Cerebrovascular disease affects arteries that supply the brain. Any condition that blocks or disrupts blood flow to the brain can cause cerebrovascular disease. Brain cells that lose blood supply start to die within minutes (stroke). Stroke is the main danger of cerebrovascular disease. Atherosclerosis and high blood pressure are common causes of cerebrovascular disease. Atherosclerosis is narrowing and hardening of an artery that results when fat, cholesterol, calcium, or other substances (plaque) build up inside an artery. Plaque reduces blood flow through the artery. High blood pressure increases the risk of bleeding inside the brain. Making diet and lifestyle changes to prevent atherosclerosis and high blood pressure lowers your risk of cerebrovascular disease. What nutrition changes can be made?  Eat more fruits, vegetables, and whole grains.  Reduce how much saturated fat you eat. To do this, eat less red meat and fewer full-fat dairy products.  Eat healthy proteins instead of red meat. Healthy proteins include: ? Fish. Eat fish that contains heart-healthy omega-3 fatty acids, twice a week. Examples include salmon, albacore tuna, mackerel, and herring. ? Chicken. ? Nuts. ? Low-fat or nonfat yogurt.  Avoid processed meats, like bacon and lunchmeat.  Avoid foods that contain: ? A lot of sugar, such as sweets and drinks with added sugar. ? A lot of salt (sodium). Avoid adding extra salt to your food, as told by your health care provider. ? Trans fats, such as margarine and baked goods. Trans fats may be listed as "partially hydrogenated oils" on food labels.  Check food labels to see how much sodium, sugar, and trans fats are in foods.  Use vegetable oils that contain low amounts of  saturated fat, such as olive oil or canola oil. What lifestyle changes can be made?  Drink alcohol in moderation. This means no more than 1 drink a day for nonpregnant women and 2 drinks a day for men. One drink equals 12 oz of beer, 5 oz of wine, or 1 oz of hard liquor.  If you are overweight, ask your health care provider to recommend a weight-loss plan for you. Losing 5-10 lb (2.2-4.5 kg) can reduce your risk of diabetes, atherosclerosis, and high blood pressure.  Exercise for 30?60 minutes on most days, or as much as told by your health care provider. ? Do moderate-intensity exercise, such as brisk walking, bicycling, and water aerobics. Ask your health care provider which activities are safe for you.  Do not use any products that contain nicotine or tobacco, such as cigarettes and e-cigarettes. If you need help quitting, ask your health care provider. Why are these changes important? Making these changes lowers your risk of many diseases that can cause cerebrovascular disease and stroke. Stroke is a leading cause of death and disability. Making these changes also improves your overall health and quality of life. What can I do to lower my risk? The following factors make you more likely to develop cerebrovascular disease:  Being overweight.  Smoking.  Being physically inactive.    Eating a high-fat diet.  Having certain health conditions, such as: ? Diabetes. ? High blood pressure. ? Heart disease. ? Atherosclerosis. ? High cholesterol. ? Sickle cell disease.  Talk with your health care provider about your risk for cerebrovascular disease. Work with your health care provider to control diseases that you have that may contribute to cerebrovascular disease. Your health care provider may prescribe medicines to help prevent major causes of cerebrovascular disease. Where to find more information: Learn more about preventing cerebrovascular disease from:  National Heart, Lung, and  Blood Institute: www.nhlbi.nih.gov/health/health-topics/topics/stroke  Centers for Disease Control and Prevention: cdc.gov/stroke/about.htm  Summary  Cerebrovascular disease can lead to a stroke.  Atherosclerosis and high blood pressure are major causes of cerebrovascular disease.  Making diet and lifestyle changes can reduce your risk of cerebrovascular disease.  Work with your health care provider to get your risk factors under control to reduce your risk of cerebrovascular disease. This information is not intended to replace advice given to you by your health care provider. Make sure you discuss any questions you have with your health care provider. Document Released: 08/12/2015 Document Revised: 02/15/2016 Document Reviewed: 08/12/2015 Elsevier Interactive Patient Education  2018 Elsevier Inc.  

## 2017-01-28 NOTE — Progress Notes (Signed)
Chief Complaint: Follow up Extracranial Carotid Artery Stenosis   History of Present Illness  Amber Barry is a 76 y.o. female patient of Dr. Hart RochesterLawson who is s/p right CEA in May, 2014. She returns today for routine surveillance.   The patient has no history of TIA or stroke symptoms. Specifically she denies a history of amaurosis fugax or monocular blindness, unilateral facial drooping, hemiplegia, or receptive or expressive aphasia. She denies pain, weakness, tingling, or numbness in either hand or arm.  The patient denies a history of cardiac problems, denies claudication symptoms, denies non-healing wounds.  The patient denies any GI bleeding history, denies easy bruising.  She reports that her maternal grandmother had "hardening of the arteries" and her father died of a stroke, he had several strokes.   Pt states MRI of her back showed arthritis in her spine, pt had physical therapy, and the pain in her legs improved.  She has a history of diverticulitis.  Pt states her blood pressure at home is in the 110's/ 70's.   Pt Diabetic: No Pt smoker: non-smoker  Pt meds include:  Statin : No: is atorvastatin and pravastatin intolerant, causes lethargy ASA: Yes, 81 mg 3x/day Other anticoagulants/antiplatelets: no   Past Medical History:  Diagnosis Date  . Anemia   . Anxiety   . Arthritis    Osteoarthritis  . Arthritis    ? diag in lower back  . Bursitis    left hip-had injection 2015  . Carotid artery occlusion April 2014   Left Bruit, 80% blockage  . CKD (chronic kidney disease)    Stage III  . Diverticulitis   . Hematuria   . Hypertension   . Interstitial cystitis   . Lower back pain June 2015  . Ocular migraine   . Osteopenia   . PONV (postoperative nausea and vomiting)     Social History Social History  Substance Use Topics  . Smoking status: Never Smoker  . Smokeless tobacco: Never Used  . Alcohol use No    Family History Family  History  Problem Relation Age of Onset  . COPD Mother        Respiratory Disease  . Rheumatic fever Mother   . Pulmonary disease Mother   . Heart attack Mother   . Stroke Father   . Hypertension Father   . Diabetes Paternal Aunt   . Prostate cancer Maternal Grandfather   . Hypertension Paternal Aunt   . Hypertension Paternal Grandmother   . Other Maternal Grandmother        hardening arteries  . Stroke Other        paternal side    Surgical History Past Surgical History:  Procedure Laterality Date  . CAROTID ENDARTERECTOMY Right 12-22-12   cea  . ENDARTERECTOMY Right 12/22/2012   Procedure: Right Carotid Endarterectomy with hemashield patch angioplasty;  Surgeon: Pryor OchoaJames D Lawson, MD;  [facility];  Service: Vascular;  Laterality: Right;    Allergies  Allergen Reactions  . Benicar Hct [Olmesartan Medoxomil-Hctz] Swelling    Facial swelling  . Buspar [Buspirone]     Facial Dysesthesia  . Norvasc [Amlodipine Besylate] Swelling    legs  . Zoloft [Sertraline Hcl] Anxiety    Increased anxiety, dysesthesia  . Lipitor [Atorvastatin] Other (See Comments)    Generalized muscle aches  . Lisinopril Swelling    Around eyes  . Motrin [Ibuprofen]     Contraindicated with Celebrex.   . Amitriptyline Hcl Anxiety    And  Paresthesias  . Pravastatin Anxiety    Pt. Wanted to sleep, no energy.    Current Outpatient Prescriptions  Medication Sig Dispense Refill  . acetaminophen (TYLENOL) 500 MG tablet Take 500 mg by mouth every 6 (six) hours as needed.    . ALPRAZolam (XANAX) 0.25 MG tablet Take 0.125 mg by mouth 2 (two) times daily as needed for anxiety.     Marland Kitchen aspirin EC 81 MG tablet Take 243 mg by mouth daily. Pt  Takes x's 3  81 mg tabs per day    . Calcium-Vitamin D (CALTRATE 600 PLUS-VIT D PO) Take 1 tablet by mouth daily at 12 noon.    . Celecoxib (CELEBREX PO) Take 200 mg by mouth daily as needed (pain). Not more than 2 per week    . Cholecalciferol (VITAMIN D) 2000 UNITS  CAPS Take 2,000 Units by mouth daily at 12 noon.     . hydrochlorothiazide (HYDRODIURIL) 25 MG tablet Take 25 mg by mouth daily.   3  . moexipril (UNIVASC) 15 MG tablet Take 15 mg by mouth daily.    . Probiotic Product (ALIGN) 4 MG CAPS Take 4 mg by mouth daily.    Marland Kitchen azelastine (ASTELIN) 0.1 % nasal spray Place 2 sprays into both nostrils 2 (two) times daily. As needed  12  . Pseudoephedrine HCl (SUDAFED PO) Take 30 mg by mouth as needed (allergies).      No current facility-administered medications for this visit.     Review of Systems : See HPI for pertinent positives and negatives.  Physical Examination  Vitals:   01/28/17 1350 01/28/17 1402  BP: 139/66 (!) 148/71  Pulse: 79   Resp: 16   Temp: 98.9 F (37.2 C)   TempSrc: Oral   SpO2: 100%   Weight: 118 lb 12.8 oz (53.9 kg)   Height: 4\' 11"  (1.499 m)    Body mass index is 23.99 kg/m.  General: WDWN female in NAD  GAIT:normal  Eyes: PERRLA  Pulmonary: Respirations are non labored, CTAB, no rales, rhonchi, or wheezing.  Cardiac: regular rhythm, no detected murmur.   VASCULAR EXAM Carotid Bruits Left Right   negative negative  Radial pulses are 2+ palpable and equal.  Abdominal aortic pulse is not palpable  LE Pulses  LEFT  RIGHT   POPLITEAL  not palpable  1+palpable  POSTERIOR TIBIAL  palpable  palpable   DORSALIS PEDIS ANTERIOR TIBIAL  2+ palpable  2+palpable     Gastrointestinal: soft, no tenderness to palpation, BS WNL, no r/g, no palpated masses.  Musculoskeletal: No muscle atrophy/wasting. M/S 5/5 throughout, Extremities without ischemic changes.  Neurologic: A&O X 3; Appropriate Affect, sensation is normal, speech is normal, CN 2-12 intact except has some hearing loss, is wearing hearing aids, pain and light touch intact in extremities, Motor exam as listed above.    Assessment: Amber Barry is a 76 y.o. female who is s/p right CEA in May, 2014. She  has no history of stroke or TIA.  DATA  Today's carotid duplex suggests a patent right carotid endarterectomy site with evidence of restenosis at the proximal patch site with extension into the surgical bulb; velocities suggest 60-79% stenosis.  Right distal CCA velocity at 444 cm/s. Left ICA with <40% stenosis. Bilateral vertebral artery flow is antegrade. Bilateral subclavian artery waveforms are normal. Disease progression of the right distal CCA since last exam on 07-16-16.    Plan: Based on today's carotid duplex results, after Dr. Randie Heinz reviewed carotid duplex  results, will schedule her for CTA of neck and head to evaluate right distal CCA velocity of 444 cm/s.  I left message on Amber Barry's home phone voicemail re this, scheduler to call her tomorrow to schedule.    I discussed in depth with the patient the nature of atherosclerosis, and emphasized the importance of maximal medical management including strict control of blood pressure, blood glucose, and lipid levels, obtaining regular exercise, and continued cessation of smoking.  The patient is aware that without maximal medical management the underlying atherosclerotic disease process will progress, limiting the benefit of any interventions. The patient was given information about stroke prevention and what symptoms should prompt the patient to seek immediate medical care. Thank you for allowing [place] to participate in this patient's care.  Charisse March, RN, MSN, FNP-C [facility] Office: [phone]  Clinic Physician: Randie Heinz  01/28/17 2:05 PM

## 2017-01-29 ENCOUNTER — Telehealth: Payer: Self-pay | Admitting: Family

## 2017-01-29 NOTE — Telephone Encounter (Signed)
Per Suzanne's instructions, I scheduled a CTA head/neck for 02/03/17 at 1pm. She is to arrive at 12:40pm and no solid foods 4 hours prior. Liquids and medications are okay. She is scheduled for the [address] [facility].  She is scheduled to see Dr.Cain on 03/06/17 at 3pm. I offered her an appt for 02/19/17 but the patient will be out of town that week. The patient is aware of the above appts. awt

## 2017-02-03 ENCOUNTER — Ambulatory Visit
Admission: RE | Admit: 2017-02-03 | Discharge: 2017-02-03 | Disposition: A | Discharge status: Home / Self Care | Payer: Medicare Other | Source: Ambulatory Visit | Attending: Family | Admitting: Family

## 2017-02-03 DIAGNOSIS — Z9889 Other specified postprocedural states: Secondary | ICD-10-CM

## 2017-02-03 DIAGNOSIS — I6521 Occlusion and stenosis of right carotid artery: Secondary | ICD-10-CM | POA: Diagnosis not present

## 2017-02-03 MED ORDER — IOPAMIDOL (ISOVUE-370) INJECTION 76%: 75.0000 mL | Freq: Once | INTRAVENOUS | Status: DC | PRN

## 2017-02-03 MED ORDER — IOPAMIDOL (ISOVUE-370) INJECTION 76%: 70.0000 mL | Freq: Once | INTRAVENOUS | Status: DC | PRN

## 2017-02-06 ENCOUNTER — Encounter: Payer: Self-pay | Admitting: Family

## 2017-02-06 ENCOUNTER — Ambulatory Visit (INDEPENDENT_AMBULATORY_CARE_PROVIDER_SITE_OTHER): Payer: Medicare Other | Admitting: Family

## 2017-02-06 VITALS — BP 131/76 | HR 95 | Temp 96.6°F | Resp 16 | Ht 59.0 in | Wt 117.0 lb

## 2017-02-06 DIAGNOSIS — Z9889 Other specified postprocedural states: Secondary | ICD-10-CM | POA: Diagnosis not present

## 2017-02-06 DIAGNOSIS — I6521 Occlusion and stenosis of right carotid artery: Secondary | ICD-10-CM

## 2017-02-06 NOTE — Patient Instructions (Signed)
Stroke Prevention Some medical conditions and behaviors are associated with an increased chance of having a stroke. You may prevent a stroke by making healthy choices and managing medical conditions. How can I reduce my risk of having a stroke?  Stay physically active. Get at least 30 minutes of activity on most or all days.  Do not smoke. It may also be helpful to avoid exposure to secondhand smoke.  Limit alcohol use. Moderate alcohol use is considered to be:  No more than 2 drinks per day for men.  No more than 1 drink per day for nonpregnant women.  Eat healthy foods. This involves:  Eating 5 or more servings of fruits and vegetables a day.  Making dietary changes that address high blood pressure (hypertension), high cholesterol, diabetes, or obesity.  Manage your cholesterol levels.  Making food choices that are high in fiber and low in saturated fat, trans fat, and cholesterol may control cholesterol levels.  Take any prescribed medicines to control cholesterol as directed by your health care provider.  Manage your diabetes.  Controlling your carbohydrate and sugar intake is recommended to manage diabetes.  Take any prescribed medicines to control diabetes as directed by your health care provider.  Control your hypertension.  Making food choices that are low in salt (sodium), saturated fat, trans fat, and cholesterol is recommended to manage hypertension.  Ask your health care provider if you need treatment to lower your blood pressure. Take any prescribed medicines to control hypertension as directed by your health care provider.  If you are 18-39 years of age, have your blood pressure checked every 3-5 years. If you are 40 years of age or older, have your blood pressure checked every year.  Maintain a healthy weight.  Reducing calorie intake and making food choices that are low in sodium, saturated fat, trans fat, and cholesterol are recommended to manage  weight.  Stop drug abuse.  Avoid taking birth control pills.  Talk to your health care provider about the risks of taking birth control pills if you are over 35 years old, smoke, get migraines, or have ever had a blood clot.  Get evaluated for sleep disorders (sleep apnea).  Talk to your health care provider about getting a sleep evaluation if you snore a lot or have excessive sleepiness.  Take medicines only as directed by your health care provider.  For some people, aspirin or blood thinners (anticoagulants) are helpful in reducing the risk of forming abnormal blood clots that can lead to stroke. If you have the irregular heart rhythm of atrial fibrillation, you should be on a blood thinner unless there is a good reason you cannot take them.  Understand all your medicine instructions.  Make sure that other conditions (such as anemia or atherosclerosis) are addressed. Get help right away if:  You have sudden weakness or numbness of the face, arm, or leg, especially on one side of the body.  Your face or eyelid droops to one side.  You have sudden confusion.  You have trouble speaking (aphasia) or understanding.  You have sudden trouble seeing in one or both eyes.  You have sudden trouble walking.  You have dizziness.  You have a loss of balance or coordination.  You have a sudden, severe headache with no known cause.  You have new chest pain or an irregular heartbeat. Any of these symptoms may represent a serious problem that is an emergency. Do not wait to see if the symptoms will go away.   Get medical help at once. Call your local emergency services (911 in [place] Do not drive yourself to the hospital. This information is not intended to replace advice given to you by your health care provider. Make sure you discuss any questions you have with your health care provider. Document Released: 09/04/2004 Document Revised: 01/03/2016 Document Reviewed: 01/28/2013 Elsevier  Interactive Patient Education  2017 Elsevier Inc.  

## 2017-02-06 NOTE — Progress Notes (Signed)
Chief Complaint: Follow up Extracranial Carotid Artery Stenosis   History of Present Illness  Amber Barry is a 76 y.o. female patient of Dr. Hart RochesterLawson who is s/p right CEA in May, 2014. She returns today for discussion of the results of the CTA of her head and neck done on 02-03-17 to further define the right CCA velocity of 444 cm/s on 01-28-17.   The patient has no history of TIA or stroke symptoms. Specifically she denies a history of amaurosis fugax or monocular blindness, unilateral facial drooping, hemiplegia, or receptive or expressive aphasia. She denies pain, weakness, tingling, or numbness in either hand or arm.  The patient denies a history of cardiac problems, denies claudication symptoms with walking, denies non-healing wounds.  The patient denies any GI bleeding history, denies easy bruising.  She reports that her maternal grandmother had "hardening of the arteries" and her father died of a stroke, he had several strokes.   Pt states MRI of her back showed arthritis in her spine, she had physical therapy, and the pain in her legs improved.  She has a history of diverticulitis.  Pt states her blood pressure at home is in the 110's/ 70's.   Pt Diabetic: No Pt smoker: non-smoker  Pt meds include:  Statin : No: is atorvastatin and pravastatin intolerant, causes lethargy ASA: Yes, 81 mg 3x/day Other anticoagulants/antiplatelets: no   Past Medical History:  Diagnosis Date  . Anemia   . Anxiety   . Arthritis    Osteoarthritis  . Arthritis    ? diag in lower back  . Bursitis    left hip-had injection 2015  . Carotid artery occlusion April 2014   Left Bruit, 80% blockage  . CKD (chronic kidney disease)    Stage III  . Diverticulitis   . Hematuria   . Hypertension   . Interstitial cystitis   . Lower back pain June 2015  . Ocular migraine   . Osteopenia   . PONV (postoperative nausea and vomiting)     Social History Social History  Substance  Use Topics  . Smoking status: Never Smoker  . Smokeless tobacco: Never Used  . Alcohol use No    Family History Family History  Problem Relation Age of Onset  . COPD Mother        Respiratory Disease  . Rheumatic fever Mother   . Pulmonary disease Mother   . Heart attack Mother   . Stroke Father   . Hypertension Father   . Diabetes Paternal Aunt   . Prostate cancer Maternal Grandfather   . Hypertension Paternal Aunt   . Hypertension Paternal Grandmother   . Other Maternal Grandmother        hardening arteries  . Stroke Other        paternal side    Surgical History Past Surgical History:  Procedure Laterality Date  . CAROTID ENDARTERECTOMY Right 12-22-12   cea  . ENDARTERECTOMY Right 12/22/2012   Procedure: Right Carotid Endarterectomy with hemashield patch angioplasty;  Surgeon: Pryor OchoaJames D Lawson, MD;  Location: [facility] OR;  Service: Vascular;  Laterality: Right;    Allergies  Allergen Reactions  . Benicar Hct [Olmesartan Medoxomil-Hctz] Swelling    Facial swelling  . Buspar [Buspirone]     Facial Dysesthesia  . Norvasc [Amlodipine Besylate] Swelling    legs  . Zoloft [Sertraline Hcl] Anxiety    Increased anxiety, dysesthesia  . Lipitor [Atorvastatin] Other (See Comments)    Generalized muscle aches  . Lisinopril  Swelling    Around eyes  . Motrin [Ibuprofen]     Contraindicated with Celebrex.   . Amitriptyline Hcl Anxiety    And Paresthesias  . Pravastatin Anxiety    Pt. Wanted to sleep, no energy.    Current Outpatient Prescriptions  Medication Sig Dispense Refill  . acetaminophen (TYLENOL) 500 MG tablet Take 500 mg by mouth every 6 (six) hours as needed.    . ALPRAZolam (XANAX) 0.25 MG tablet Take 0.125 mg by mouth 2 (two) times daily as needed for anxiety.     Marland Kitchen aspirin EC 81 MG tablet Take 243 mg by mouth daily. Pt  Takes x's 3  81 mg tabs per day    . azelastine (ASTELIN) 0.1 % nasal spray Place 2 sprays into both nostrils 2 (two) times daily. As needed  12   . Calcium-Vitamin D (CALTRATE 600 PLUS-VIT D PO) Take 1 tablet by mouth daily at 12 noon.    . Celecoxib (CELEBREX PO) Take 200 mg by mouth daily as needed (pain). Not more than 2 per week    . Cholecalciferol (VITAMIN D) 2000 UNITS CAPS Take 2,000 Units by mouth daily at 12 noon.     . hydrochlorothiazide (HYDRODIURIL) 25 MG tablet Take 25 mg by mouth daily.   3  . moexipril (UNIVASC) 15 MG tablet Take 15 mg by mouth daily.    . Probiotic Product (ALIGN) 4 MG CAPS Take 4 mg by mouth daily.    . Pseudoephedrine HCl (SUDAFED PO) Take 30 mg by mouth as needed (allergies).      No current facility-administered medications for this visit.     Review of Systems : See HPI for pertinent positives and negatives.  Physical Examination  Vitals:   02/06/17 1347 02/06/17 1348  BP: 134/74 131/76  Pulse: 95   Resp: 16   Temp: (!) 96.6 F (35.9 C)   TempSrc: Oral   SpO2: 98%   Weight: 117 lb (53.1 kg)   Height: 4\' 11"  (1.499 m)    Body mass index is 23.63 kg/m.  General: WDWN female in NAD  GAIT:normal  Eyes: PERRLA  Pulmonary: Respirations are non labored, CTAB, no rales, rhonchi, or wheezing.  Cardiac: regular rhythm and rate, no detected murmur.   VASCULAR EXAM Carotid Bruits Left Right   negative negative  Radial pulses are 2+ palpable and equal.  Abdominal aortic pulse is not palpable  LE Pulses  LEFT  RIGHT   POPLITEAL  not palpable  1+palpable  POSTERIOR TIBIAL  palpable  palpable   DORSALIS PEDIS ANTERIOR TIBIAL  2+ palpable  2+palpable     Gastrointestinal: soft, no tenderness to palpation,BS WNL, no r/g, no palpated masses.  Musculoskeletal: No muscle atrophy/wasting. M/S 5/5 throughout, Extremities without ischemic changes.  Neurologic: A&O X 3; Appropriate Affect, sensation is normal, speech is normal, CN 2-12 intact except has some hearing loss, is wearing hearing aids, pain and light touch intact in  extremities, Motor exam as listed above.   Assessment: Amber Barry is a 76 y.o. female who is s/p right CEA in May, 2014. She has no history of stroke or TIA.  Dr. Randie Heinz spoke with pt and her daughter, and showed pt and daughter the images of her neck and the narrowing of the right CCA of concern.  Dr. Randie Heinz offered pt several options to address the stenosis noted on the CTA of her neck: schedule her for surgery to removed the partial blockage, or think about it and  discuss with her family and call to schedule an appointment with Dr. Randie Heinz if she decides to have the procedure done, or wait 6 months when she returns for carotid duplex. She chose to wait and will let us know if she wants to schedule with Dr. Randie Heinz any sooner.   DATA  CTA Head and Neck (02-03-17): Right carotid system: Common carotid artery shows calcified and soft plaque. Just proximal to the endarterectomy site, soft plaque results an luminal narrowing to a diameter of 2 mm. Expected diameter in this location would be 8 mm. This indicates 75% stenosis. There is wide patency through the endarterectomy segment. No stenosis at the distal and or beyond that in the cervical ICA. Left carotid system: Common carotid artery widely patent to the bifurcation region. There is calcified and soft plaque affecting the ICA bulb. Minimal diameter at the distal bulb is 3 mm. Compared to a more distal cervical ICA diameter of 5 mm, this indicates a 40% stenosis. Cervical ICA is widely patent beyond that. Vertebral arteries: Both vertebral artery origins are widely patent. Both vertebral arteries are widely patent through the cervical region. The right is slightly larger than the left.   Carotid Duplex (01-28-17): Patent right carotid endarterectomy site with evidence of restenosis at the proximal patch site with extension into the surgical bulb; velocities suggest 60-79% stenosis.  Right distal CCA velocity at 444 cm/s. Left ICA with  <40% stenosis. Bilateral vertebral artery flow is antegrade. Bilateral subclavian artery waveforms are normal. Disease progression of the right distal CCA since last exam on 07-16-16.     Plan: Follow-up in 6 months with Carotid Duplex scan.     I discussed in depth with the patient the nature of atherosclerosis, and emphasized the importance of maximal medical management including strict control of blood pressure, blood glucose, and lipid levels, obtaining regular exercise, and continued cessation of smoking.  The patient is aware that without maximal medical management the underlying atherosclerotic disease process will progress, limiting the benefit of any interventions. The patient was given information about stroke prevention and what symptoms should prompt the patient to seek immediate medical care. Thank you for allowing [place] to participate in this patient's care.  Charisse March, RN, MSN, FNP-C [facility] Office: [phone]  Clinic Physician: Randie Heinz  02/06/17 1:53 PM

## 2017-02-13 ENCOUNTER — Encounter (HOSPITAL_COMMUNITY): Payer: Self-pay | Admitting: Nurse Practitioner

## 2017-02-13 ENCOUNTER — Emergency Department (HOSPITAL_COMMUNITY): Payer: Medicare Other

## 2017-02-13 ENCOUNTER — Emergency Department (HOSPITAL_COMMUNITY)
Admission: EM | Admit: 2017-02-13 | Discharge: 2017-02-13 | Disposition: A | Discharge status: Home / Self Care | Payer: Medicare Other | Attending: Emergency Medicine | Admitting: Emergency Medicine

## 2017-02-13 DIAGNOSIS — F419 Anxiety disorder, unspecified: Secondary | ICD-10-CM | POA: Insufficient documentation

## 2017-02-13 DIAGNOSIS — I129 Hypertensive chronic kidney disease with stage 1 through stage 4 chronic kidney disease, or unspecified chronic kidney disease: Secondary | ICD-10-CM | POA: Diagnosis not present

## 2017-02-13 DIAGNOSIS — Z7982 Long term (current) use of aspirin: Secondary | ICD-10-CM | POA: Insufficient documentation

## 2017-02-13 DIAGNOSIS — R531 Weakness: Secondary | ICD-10-CM | POA: Diagnosis not present

## 2017-02-13 DIAGNOSIS — N183 Chronic kidney disease, stage 3 (moderate): Secondary | ICD-10-CM | POA: Diagnosis not present

## 2017-02-13 DIAGNOSIS — M6281 Muscle weakness (generalized): Secondary | ICD-10-CM | POA: Diagnosis not present

## 2017-02-13 DIAGNOSIS — R069 Unspecified abnormalities of breathing: Secondary | ICD-10-CM | POA: Diagnosis not present

## 2017-02-13 DIAGNOSIS — R42 Dizziness and giddiness: Secondary | ICD-10-CM | POA: Insufficient documentation

## 2017-02-13 DIAGNOSIS — S0990XA Unspecified injury of head, initial encounter: Secondary | ICD-10-CM | POA: Diagnosis not present

## 2017-02-13 DIAGNOSIS — R51 Headache: Secondary | ICD-10-CM | POA: Diagnosis not present

## 2017-02-13 DIAGNOSIS — Z79899 Other long term (current) drug therapy: Secondary | ICD-10-CM | POA: Diagnosis not present

## 2017-02-13 LAB — DIFFERENTIAL
Basophils Absolute: 0 10*3/uL (ref 0.0–0.1)
Basophils Relative: 0 %
EOS ABS: 0.1 10*3/uL (ref 0.0–0.7)
EOS PCT: 2 %
LYMPHS ABS: 2.1 10*3/uL (ref 0.7–4.0)
Lymphocytes Relative: 28 %
MONOS PCT: 7 %
Monocytes Absolute: 0.5 10*3/uL (ref 0.1–1.0)
Neutro Abs: 4.7 10*3/uL (ref 1.7–7.7)
Neutrophils Relative %: 63 %

## 2017-02-13 LAB — URINALYSIS, ROUTINE W REFLEX MICROSCOPIC
BILIRUBIN URINE: NEGATIVE
GLUCOSE, UA: NEGATIVE mg/dL
HGB URINE DIPSTICK: NEGATIVE
Ketones, ur: NEGATIVE mg/dL
NITRITE: NEGATIVE
PH: 7 (ref 5.0–8.0)
Protein, ur: NEGATIVE mg/dL
SPECIFIC GRAVITY, URINE: 1.009 (ref 1.005–1.030)

## 2017-02-13 LAB — CBC
HCT: 39 % (ref 36.0–46.0)
HEMOGLOBIN: 12.5 g/dL (ref 12.0–15.0)
MCH: 28.9 pg (ref 26.0–34.0)
MCHC: 32.1 g/dL (ref 30.0–36.0)
MCV: 90.3 fL (ref 78.0–100.0)
PLATELETS: 226 10*3/uL (ref 150–400)
RBC: 4.32 MIL/uL (ref 3.87–5.11)
RDW: 13.1 % (ref 11.5–15.5)
WBC: 7.4 10*3/uL (ref 4.0–10.5)

## 2017-02-13 LAB — BASIC METABOLIC PANEL
ANION GAP: 9 (ref 5–15)
BUN: 20 mg/dL (ref 6–20)
CALCIUM: 9.6 mg/dL (ref 8.9–10.3)
CO2: 23 mmol/L (ref 22–32)
Chloride: 104 mmol/L (ref 101–111)
Creatinine, Ser: 1.08 mg/dL — ABNORMAL HIGH (ref 0.44–1.00)
GFR calc Af Amer: 56 mL/min — ABNORMAL LOW (ref >60)
GFR, EST NON AFRICAN AMERICAN: 49 mL/min — AB (ref >60)
GLUCOSE: 116 mg/dL — AB (ref 65–99)
Potassium: 3.8 mmol/L (ref 3.5–5.1)
Sodium: 136 mmol/L (ref 135–145)

## 2017-02-13 LAB — I-STAT TROPONIN, ED: TROPONIN I, POC: 0 ng/mL (ref 0.00–0.08)

## 2017-02-13 MED ORDER — SODIUM CHLORIDE 0.9 % IV BOLUS (SEPSIS): 500.0000 mL | Freq: Once | INTRAVENOUS | Status: DC

## 2017-02-13 MED ORDER — ALPRAZOLAM 0.25 MG PO TABS
0.1250 mg | ORAL_TABLET | Freq: Once | ORAL | Status: AC
  Administered 2017-02-13: 0.125 mg via ORAL
  Filled 2017-02-13: qty 1

## 2017-02-13 NOTE — ED Triage Notes (Addendum)
Per EMS patient was walking outside to take the garbage out and had an episode of weakness, and lightheadedness and increased anxiety, mental fog and dry mouth and possibly some numbness on left side of face and left arm. Symptoms lasted approximately 15-20 min.  Denies headache, visual changes, slurred speech, chest pain, shortness of breath. Patient denies any symptoms presently.  Patient took half a xanax and three 81 mg ASA PTA.

## 2017-02-13 NOTE — Discharge Instructions (Signed)
Your work-up today is reassuring.  When you return home, keep well hydrated and get rest.  Follow-up with your primary care doctor as scheduled in 10 days.  Return for worsening symptoms, including passing out, confusion, chest pains or difficulty breathing, numbness/weakness on one side, or any other symptoms concerning to you.

## 2017-02-13 NOTE — ED Provider Notes (Signed)
[facility]-EMERGENCY DEPT Provider Note   CSN: [id number] Arrival date & time: 02/13/17  0940     History   Chief Complaint Chief Complaint  Patient presents with  . Weakness    HPI Amber Barry is a 76 y.o. female.  The history is provided by the patient.   76 year old female who presents with generalized weakness and lightheadedness. States that she woke up this morning feeling well. States that she walked outside to take the garbage out, walked back in her home. States that while she was fixing her tea and about to take her morning medications gradually felt generally weak and lightheaded. Feeling gradually worsening, and reports feeling very anxious, had a mental fog over her. Her son was with her, and states that she started feeling her left side of her face and arm. She states that she did not have any numbness or tingling, but was just nervous about potentially this being a stroke that she started doing that. He says that maybe briefly her left hand felt heavy, but does not seem to have true one sided weakness. She asked her son to call for help. Prior to arrival she did take half of Xanax and 3 baby aspirins, and now feels back to baseline. Denies any associating chest pain or difficulty breathing. No recent illnesses including fever, cough, dyspnea, vomiting or diarrhea, GI bleeding, abdominal pain, or back pain.  Past Medical History:  Diagnosis Date  . Anemia   . Anxiety   . Arthritis    Osteoarthritis  . Arthritis    ? diag in lower back  . Bursitis    left hip-had injection 2015  . Carotid artery occlusion April 2014   Left Bruit, 80% blockage  . CKD (chronic kidney disease)    Stage III  . Diverticulitis   . Hematuria   . Hypertension   . Interstitial cystitis   . Lower back pain June 2015  . Ocular migraine   . Osteopenia   . PONV (postoperative nausea and vomiting)     Patient Active Problem List   Diagnosis Date Noted  . Aftercare following surgery of the  circulatory system, NEC 01/04/2014  . Unspecified constipation 12/05/2013  . Occlusion and stenosis of carotid artery without mention of cerebral infarction 11/30/2012    Past Surgical History:  Procedure Laterality Date  . CAROTID ENDARTERECTOMY Right 12-22-12   cea  . ENDARTERECTOMY Right 12/22/2012   Procedure: Right Carotid Endarterectomy with hemashield patch angioplasty;  Surgeon: Pryor Ochoa, MD;  Location: [facility] OR;  Service: Vascular;  Laterality: Right;    OB History    Gravida Para Term Preterm AB Living   2 2       2    SAB TAB Ectopic Multiple Live Births                   Home Medications    Prior to Admission medications   Medication Sig Start Date End Date Taking? Authorizing Provider  acetaminophen (TYLENOL) 500 MG tablet Take 500 mg by mouth every 6 (six) hours as needed.   Yes [provider]  ALPRAZolam (XANAX) 0.25 MG tablet Take 0.125 mg by mouth 2 (two) times daily as needed for anxiety.    Yes [provider]  aspirin EC 81 MG tablet Take 243 mg by mouth daily. Pt  Takes x's 3  81 mg tabs per day   Yes [provider]  azelastine (ASTELIN) 0.1 % nasal spray Place 2 sprays  into both nostrils 2 (two) times daily as needed for rhinitis or allergies. As needed 11/15/14  Yes [provider]  Calcium-Vitamin D (CALTRATE 600 PLUS-VIT D PO) Take 1 tablet by mouth daily at 12 noon.   Yes [provider]  Celecoxib (CELEBREX PO) Take 200 mg by mouth daily as needed (pain). Not more than 2 per week   Yes [provider]  Cholecalciferol (VITAMIN D) 2000 UNITS CAPS Take 2,000 Units by mouth daily at 12 noon.    Yes [provider]  hydrochlorothiazide (HYDRODIURIL) 25 MG tablet Take 25 mg by mouth daily.  11/15/14  Yes [provider]  moexipril (UNIVASC) 15 MG tablet Take 15 mg by mouth daily.   Yes [provider]  Probiotic Product (ALIGN) 4 MG CAPS Take 4 mg by mouth daily.   Yes [provider]  Pseudoephedrine HCl (SUDAFED PO) Take 30 mg by mouth as needed (allergies).    Yes [provider]    Family History Family History  Problem Relation Age of Onset  . COPD Mother        Respiratory Disease  . Rheumatic fever Mother   . Pulmonary disease Mother   . Heart attack Mother   . Stroke Father   . Hypertension Father   . Diabetes Paternal Aunt   . Prostate cancer Maternal Grandfather   . Hypertension Paternal Aunt   . Hypertension Paternal Grandmother   . Other Maternal Grandmother        hardening arteries  . Stroke Other        paternal side    Social History Social History  Substance Use Topics  . Smoking status: Never Smoker  . Smokeless tobacco: Never Used  . Alcohol use No     Allergies   Benicar hct [olmesartan medoxomil-hctz]; Buspar [buspirone]; Norvasc [amlodipine besylate]; Zoloft [sertraline hcl]; Lipitor [atorvastatin]; Lisinopril; Motrin [ibuprofen]; Amitriptyline hcl; and Pravastatin   Review of Systems Review of Systems  Constitutional: Negative for fever.  Eyes: Negative for visual disturbance.  Respiratory: Negative for shortness of breath.   Cardiovascular: Negative for chest pain.  Gastrointestinal: Negative for abdominal pain.  Neurological: Negative for syncope and speech difficulty.  Hematological: Does not bruise/bleed easily.  All other systems reviewed and are negative.    Physical Exam Updated Vital Signs BP (!) 136/59   Pulse 73   Temp 98.5 F (36.9 C)   Resp 15   Ht 4\' 11"  (1.499 m)   Wt 53.1 kg (117 lb)   LMP 08/11/2002   SpO2 93%   BMI 23.63 kg/m   Physical Exam Physical Exam  Nursing note and vitals reviewed. Constitutional: Well developed, well nourished, non-toxic, and in no acute distress Head: Normocephalic and atraumatic.  Mouth/Throat: Oropharynx is clear and moist.  Neck: Normal range of motion. Neck supple.  Cardiovascular: Normal rate and regular rhythm.   Pulmonary/Chest:  Effort normal and breath sounds normal.  Abdominal: Soft. There is no tenderness. There is no rebound and no guarding.  Musculoskeletal: Normal range of motion.  Neurological: Alert, no facial droop, fluent speech, moves all extremities symmetrically Skin: Skin is warm and dry.  Psychiatric: Cooperative Neurological:  Alert, oriented to person, place, time, and situation. Memory grossly in tact. Fluent speech. No dysarthria or aphasia.  Cranial nerves: VF are full.  No gaze deviation. Facial muscles symmetric with activation. Sensation to light touch over face in tact bilaterally. Hearing grossly in tact. Palate elevates symmetrically. Head turn and shoulder  shrug are intact. Tongue midline.  Reflexes defered.  Muscle bulk and tone normal. No pronator drift. Moves all extremities symmetrically. Sensation to light touch is in tact throughout in bilateral upper and lower extremities. Coordination reveals no dysmetria with finger to nose.    ED Treatments / Results  Labs (all labs ordered are listed, but only abnormal results are displayed) Labs Reviewed  BASIC METABOLIC PANEL - Abnormal; Notable for the following:       Result Value   Glucose, Bld 116 (*)    Creatinine, Ser 1.08 (*)    GFR calc non Af Amer 49 (*)    GFR calc Af Amer 56 (*)    All other components within normal limits  URINALYSIS, ROUTINE W REFLEX MICROSCOPIC - Abnormal; Notable for the following:    Color, Urine STRAW (*)    Leukocytes, UA SMALL (*)    Bacteria, UA RARE (*)    Squamous Epithelial / LPF 0-5 (*)    All other components within normal limits  CBC  DIFFERENTIAL  I-STAT TROPOININ, ED    EKG  EKG Interpretation  Date/Time:  Friday February 13 2017 09:42:01 EDT Ventricular Rate:  79 PR Interval:    QRS Duration: 129 QT Interval:  388 QTC Calculation: 445 R Axis:   81 Text Interpretation:  Sinus rhythm Right bundle branch block similar to previous EKG  Confirmed by Crista Curb [phone]) on 02/13/2017  10:45:09 AM       Radiology Ct Head Wo Contrast  Result Date: 02/13/2017 CLINICAL DATA:  Pt states that she felt "weird". Not lightheaded but weak and like she was going to pass out. Felt "cloudy". No headache, dizziness, or vision problems. EXAM: CT HEAD WITHOUT CONTRAST TECHNIQUE: Contiguous axial images were obtained from the base of the skull through the vertex without intravenous contrast. COMPARISON:  CT 02/03/2017. FINDINGS: Brain: There is no evidence of acute intracranial hemorrhage, mass lesion, brain edema or extra-axial fluid collection. The ventricles and subarachnoid spaces are appropriately sized for age. There is no CT evidence of acute cortical infarction. Vascular: No hyperdense vessel or unexpected calcification. Skull: Negative for fracture or focal lesion. Sinuses/Orbits: The visualized paranasal sinuses and mastoid air cells are clear. No orbital abnormalities are seen. Other: None. IMPRESSION: Stable unremarkable noncontrast head CT. Electronically Signed   By: Carey Bullocks M.D.   On: 02/13/2017 11:19    Procedures Procedures (including critical care time)  Medications Ordered in ED Medications  ALPRAZolam (XANAX) tablet 0.125 mg (0.125 mg Oral Given 02/13/17 1020)     Initial Impression / Assessment and Plan / ED Course  I have reviewed the triage vital signs and the nursing notes.  Pertinent labs & imaging results that were available during my care of the patient were reviewed by me and considered in my medical decision making (see chart for details).     Presenting with episode of generalized weakness and lightheadedness after walking outside.  She is very well-appearing and asymptomatic in the ED with normal vital signs. No focal neurological deficits. Doubt TIA or stroke. Although EMS reports that patient reported possible numbness on left face and arm, she states she was not numb or tingling, and that she was touching her face and arm to make sure it was not  numb or weak from a possible stroke. She did have screening CT head, that was visualized and shows no acute intracranial processes.   EKG without stigmata of arrhythmia or ischemia. Doubt cardiogenic or arrhythmogenic etiology. Blood  work overall reassuring without major electrolyte or metabolic derangements. Baseline renal function. No signs of urinary tract infection.  She is felt stable for discharge home. Do not suspect serious/emergent etiology of symptoms.    Strict return and follow-up instructions reviewed. She expressed understanding of all discharge instructions and felt comfortable with the plan of care.   Final Clinical Impressions(s) / ED Diagnoses   Final diagnoses:  Generalized weakness  Lightheadedness    New Prescriptions New Prescriptions   No medications on file     Lavera Guise, MD 02/13/17 1212

## 2017-02-20 ENCOUNTER — Ambulatory Visit: Payer: Medicare Other | Admitting: Vascular Surgery

## 2017-02-20 NOTE — Addendum Note (Signed)
Addended by: Burton ApleyPETTY, Jenaya Saar A on: 02/20/2017 03:56 PM   Modules accepted: Orders

## 2017-02-24 DIAGNOSIS — I779 Disorder of arteries and arterioles, unspecified: Secondary | ICD-10-CM | POA: Diagnosis not present

## 2017-02-24 DIAGNOSIS — F411 Generalized anxiety disorder: Secondary | ICD-10-CM | POA: Diagnosis not present

## 2017-03-06 ENCOUNTER — Ambulatory Visit: Payer: Medicare Other | Admitting: Vascular Surgery

## 2017-03-25 DIAGNOSIS — F411 Generalized anxiety disorder: Secondary | ICD-10-CM | POA: Diagnosis not present

## 2017-03-25 DIAGNOSIS — H9190 Unspecified hearing loss, unspecified ear: Secondary | ICD-10-CM | POA: Diagnosis not present

## 2017-04-23 DIAGNOSIS — M1990 Unspecified osteoarthritis, unspecified site: Secondary | ICD-10-CM | POA: Diagnosis not present

## 2017-04-23 DIAGNOSIS — Z23 Encounter for immunization: Secondary | ICD-10-CM | POA: Diagnosis not present

## 2017-04-23 DIAGNOSIS — J309 Allergic rhinitis, unspecified: Secondary | ICD-10-CM | POA: Diagnosis not present

## 2017-04-23 DIAGNOSIS — F411 Generalized anxiety disorder: Secondary | ICD-10-CM | POA: Diagnosis not present

## 2017-05-20 DIAGNOSIS — M1990 Unspecified osteoarthritis, unspecified site: Secondary | ICD-10-CM | POA: Diagnosis not present

## 2017-05-20 DIAGNOSIS — L570 Actinic keratosis: Secondary | ICD-10-CM | POA: Diagnosis not present

## 2017-05-20 DIAGNOSIS — Z0000 Encounter for general adult medical examination without abnormal findings: Secondary | ICD-10-CM | POA: Diagnosis not present

## 2017-05-20 DIAGNOSIS — M8580 Other specified disorders of bone density and structure, unspecified site: Secondary | ICD-10-CM | POA: Diagnosis not present

## 2017-05-20 DIAGNOSIS — N3010 Interstitial cystitis (chronic) without hematuria: Secondary | ICD-10-CM | POA: Diagnosis not present

## 2017-05-20 DIAGNOSIS — I779 Disorder of arteries and arterioles, unspecified: Secondary | ICD-10-CM | POA: Diagnosis not present

## 2017-05-20 DIAGNOSIS — I129 Hypertensive chronic kidney disease with stage 1 through stage 4 chronic kidney disease, or unspecified chronic kidney disease: Secondary | ICD-10-CM | POA: Diagnosis not present

## 2017-05-20 DIAGNOSIS — Z1389 Encounter for screening for other disorder: Secondary | ICD-10-CM | POA: Diagnosis not present

## 2017-05-20 DIAGNOSIS — F411 Generalized anxiety disorder: Secondary | ICD-10-CM | POA: Diagnosis not present

## 2017-05-20 DIAGNOSIS — J309 Allergic rhinitis, unspecified: Secondary | ICD-10-CM | POA: Diagnosis not present

## 2017-05-20 DIAGNOSIS — N183 Chronic kidney disease, stage 3 (moderate): Secondary | ICD-10-CM | POA: Diagnosis not present

## 2017-05-20 DIAGNOSIS — R7301 Impaired fasting glucose: Secondary | ICD-10-CM | POA: Diagnosis not present

## 2017-05-27 NOTE — Progress Notes (Signed)
76 y.o. G2P2 WidowedCaucasianF here for annual exam.  Doing well, currently.  Was in the ER this summer due to increased blood pressure.  She also had diverticulitis in December.    Has some new carotid stenosis.  Has decided to just follow conservatively right now as risks of surgery and risks of stroke are about the same.  Has repeat ultrasound scheduled in January.  Is now on Lexapro, generic, for anxiety.  Feeling really much better with this medication.    Denies vaginal bleeding.    Patient's last menstrual period was 08/11/2002.          Sexually active: No.  The current method of family planning is post menopausal status.    Exercising: Yes.    low impact Smoker:  no  Health Maintenance: Pap:  11/28/13 negative, 05/09/10 negative  History of abnormal Pap:  no MMG:  01/14/17 BIRADS 1 negative  Colonoscopy:  11/17/16 [facility], no repeat per patient BMD:   11/03/14 osteopenia, repeat 3 years  TDaP:  05/08/2011  Pneumonia vaccine(s): 05/12/05, 05/11/14  Zostavax:   12/10/16 (PCP), 04/29/17 ([facility])  Hep C testing: not indicated Screening Labs: PCP, Hb today: PCP   reports that she has never smoked. She has never used smokeless tobacco. She reports that she does not drink alcohol or use drugs.  Past Medical History:  Diagnosis Date  . Anemia   . Anxiety   . Arthritis    Osteoarthritis  . Arthritis    ? diag in lower back  . Bursitis    left hip-had injection 2015  . Carotid artery occlusion April 2014   Left Bruit, 80% blockage  . CKD (chronic kidney disease)    Stage III  . Diverticulitis   . Hematuria   . Hypertension   . Interstitial cystitis   . Lower back pain June 2015  . Ocular migraine   . Osteopenia   . PONV (postoperative nausea and vomiting)     Past Surgical History:  Procedure Laterality Date  . CAROTID ENDARTERECTOMY Right 12-22-12   cea  . ENDARTERECTOMY Right 12/22/2012   Procedure: Right Carotid Endarterectomy with hemashield patch angioplasty;  Surgeon:  Pryor OchoaJames D Lawson, MD;  Location: [facility] OR;  Service: Vascular;  Laterality: Right;    Current Outpatient Prescriptions  Medication Sig Dispense Refill  . acetaminophen (TYLENOL) 500 MG tablet Take 500 mg by mouth every 6 (six) hours as needed.    . ALPRAZolam (XANAX) 0.25 MG tablet Take 0.125 mg by mouth 2 (two) times daily as needed for anxiety.     Marland Kitchen. aspirin EC 81 MG tablet Take 243 mg by mouth daily. Pt  Takes x's 3  81 mg tabs per day    . azelastine (ASTELIN) 0.1 % nasal spray Place 2 sprays into both nostrils 2 (two) times daily as needed for rhinitis or allergies. As needed  12  . Calcium-Vitamin D (CALTRATE 600 PLUS-VIT D PO) Take 1 tablet by mouth daily at 12 noon.    . Celecoxib (CELEBREX PO) Take 200 mg by mouth daily as needed (pain). Not more than 2 per week    . Cholecalciferol (VITAMIN D) 2000 UNITS CAPS Take 2,000 Units by mouth daily at 12 noon.     . escitalopram (LEXAPRO) 10 MG tablet TK 1 T PO QD  6  . hydrochlorothiazide (HYDRODIURIL) 25 MG tablet Take 25 mg by mouth daily.   3  . moexipril (UNIVASC) 15 MG tablet Take 15 mg by mouth daily.    .Marland Kitchen  Probiotic Product (ALIGN) 4 MG CAPS Take 4 mg by mouth daily.    . Pseudoephedrine HCl (SUDAFED PO) Take 30 mg by mouth as needed (allergies).      No current facility-administered medications for this visit.     Family History  Problem Relation Age of Onset  . COPD Mother        Respiratory Disease  . Rheumatic fever Mother   . Pulmonary disease Mother   . Heart attack Mother   . Stroke Father   . Hypertension Father   . Diabetes Paternal Aunt   . Prostate cancer Maternal Grandfather   . Hypertension Paternal Aunt   . Hypertension Paternal Grandmother   . Other Maternal Grandmother        hardening arteries  . Stroke Other        paternal side    ROS:  Pertinent items are noted in HPI.  Otherwise, a comprehensive ROS was negative.  Exam:   BP 128/68 (BP Location: Right Arm, Patient Position: Sitting, Cuff Size:  Normal)   Pulse 76   Resp 14   Ht 4' 10.5" (1.486 m)   Wt 119 lb 8 oz (54.2 kg)   LMP 08/11/2002   BMI 24.55 kg/m   Weight change: @WEIGHTCHANGE @ Height:   Height: 4' 10.5" (148.6 cm)  Ht Readings from Last 3 Encounters:  05/28/17 4' 10.5" (1.486 m)  02/13/17 4\' 11"  (1.499 m)  02/06/17 4\' 11"  (1.499 m)    General appearance: alert, cooperative and appears stated age Head: Normocephalic, without obvious abnormality, atraumatic Neck: no adenopathy, supple, symmetrical, trachea midline and thyroid normal to inspection and palpation Lungs: clear to auscultation bilaterally Breasts: normal appearance, no masses or tenderness Heart: regular rate and rhythm Abdomen: soft, non-tender; bowel sounds normal; no masses,  no organomegaly Extremities: extremities normal, atraumatic, no cyanosis or edema Skin: Skin color, texture, turgor normal. No rashes or lesions Lymph nodes: Cervical, supraclavicular, and axillary nodes normal. No abnormal inguinal nodes palpated Neurologic: Grossly normal   Pelvic: External genitalia:  no lesions              Urethra:  normal appearing urethra with no masses, tenderness or lesions              Bartholins and Skenes: normal                 Vagina: normal appearing vagina with normal color and discharge, no lesions              Cervix: no lesions              Pap taken: Yes.   Bimanual Exam:  Uterus:  normal size, contour, position, consistency, mobility, non-tender              Adnexa: normal adnexa and no mass, fullness, tenderness               Rectovaginal: Confirms               Anus:  normal sphincter tone, no lesions  Chaperone was present for exam.  A:  Well Woman with normal exam PMP, no HRT Hypertension Carotid plaques, currently being followed conservatively Osteopenia.  On drug holiday from Fosamax.  Last done 3/16. H/O diverticulitis 12/17  P:   Mammogram guidelines reviewed.  Doing yearly.  pap smear obtained today Plan BMD next  year. Release of colonoscopy will be signed today Lab work and vaccines are UTD.  Has follow up with  Dr. Manus Gunning and vascular scheduled Return annually or prn

## 2017-05-28 ENCOUNTER — Ambulatory Visit (INDEPENDENT_AMBULATORY_CARE_PROVIDER_SITE_OTHER): Payer: Medicare Other | Admitting: Obstetrics & Gynecology

## 2017-05-28 ENCOUNTER — Other Ambulatory Visit (HOSPITAL_COMMUNITY)
Admission: RE | Admit: 2017-05-28 | Discharge: 2017-05-28 | Disposition: A | Discharge status: Home / Self Care | Payer: Medicare Other | Source: Ambulatory Visit | Attending: Obstetrics & Gynecology | Admitting: Obstetrics & Gynecology

## 2017-05-28 ENCOUNTER — Encounter: Payer: Self-pay | Admitting: Obstetrics & Gynecology

## 2017-05-28 VITALS — BP 128/68 | HR 76 | Resp 14 | Ht 58.5 in | Wt 119.5 lb

## 2017-05-28 DIAGNOSIS — Z01419 Encounter for gynecological examination (general) (routine) without abnormal findings: Secondary | ICD-10-CM

## 2017-05-28 DIAGNOSIS — Z124 Encounter for screening for malignant neoplasm of cervix: Secondary | ICD-10-CM | POA: Insufficient documentation

## 2017-05-29 LAB — CYTOLOGY - PAP: Diagnosis: NEGATIVE

## 2017-06-03 DIAGNOSIS — H903 Sensorineural hearing loss, bilateral: Secondary | ICD-10-CM | POA: Diagnosis not present

## 2017-06-05 ENCOUNTER — Ambulatory Visit: Payer: Medicare Other | Admitting: Vascular Surgery

## 2017-06-10 ENCOUNTER — Other Ambulatory Visit: Payer: Self-pay | Admitting: Family Medicine

## 2017-06-10 DIAGNOSIS — L570 Actinic keratosis: Secondary | ICD-10-CM | POA: Diagnosis not present

## 2017-06-10 DIAGNOSIS — L905 Scar conditions and fibrosis of skin: Secondary | ICD-10-CM | POA: Diagnosis not present

## 2017-08-14 ENCOUNTER — Ambulatory Visit (INDEPENDENT_AMBULATORY_CARE_PROVIDER_SITE_OTHER): Payer: Medicare Other | Admitting: Family

## 2017-08-14 ENCOUNTER — Ambulatory Visit (HOSPITAL_COMMUNITY)
Admission: RE | Admit: 2017-08-14 | Discharge: 2017-08-14 | Disposition: A | Discharge status: Home / Self Care | Payer: Medicare Other | Source: Ambulatory Visit | Attending: Family | Admitting: Family

## 2017-08-14 ENCOUNTER — Encounter: Payer: Self-pay | Admitting: Family

## 2017-08-14 ENCOUNTER — Other Ambulatory Visit: Payer: Self-pay

## 2017-08-14 VITALS — BP 137/66 | HR 68 | Temp 97.2°F | Resp 16 | Ht 59.0 in | Wt 120.0 lb

## 2017-08-14 DIAGNOSIS — I6523 Occlusion and stenosis of bilateral carotid arteries: Secondary | ICD-10-CM | POA: Diagnosis not present

## 2017-08-14 DIAGNOSIS — I6521 Occlusion and stenosis of right carotid artery: Secondary | ICD-10-CM

## 2017-08-14 DIAGNOSIS — Z9889 Other specified postprocedural states: Secondary | ICD-10-CM | POA: Insufficient documentation

## 2017-08-14 LAB — VAS US CAROTID
LCCADDIAS: 17 cm/s
LEFT ECA DIAS: -6 cm/s
LICAPSYS: 111 cm/s
Left CCA dist sys: 101 cm/s
Left CCA prox dias: 22 cm/s
Left CCA prox sys: 124 cm/s
Left ICA dist dias: -24 cm/s
Left ICA dist sys: -113 cm/s
Left ICA prox dias: 23 cm/s
RCCADSYS: -82 cm/s
RCCAPSYS: 66 cm/s
RIGHT CCA MID DIAS: 13 cm/s
RIGHT ECA DIAS: -8 cm/s
Right CCA prox dias: 18 cm/s

## 2017-08-14 NOTE — Patient Instructions (Signed)

## 2017-08-14 NOTE — Progress Notes (Signed)
Chief Complaint: Follow up Extracranial Carotid Artery Stenosis   History of Present Illness  Amber Barry is a 77 y.o. female who is s/p right CEA in May, 2014 by Dr. Hart Rochester. CTA of her head and neck was done on 02-03-17 to further define the right CCA velocity of 444 cm/s on 01-28-17.   The patient has no history of TIA or stroke symptoms. Specifically she denies a history of amaurosis fugax or monocular blindness, unilateral facial drooping, hemiplegia, or receptive or expressive aphasia. She denies pain, weakness, tingling, or numbness in either hand or arm.  The patient denies a history of cardiac problems, denies claudication symptoms with walking, denies non-healing wounds.   The patient denies any GI bleeding history, denies easy bruising.  She reports that her maternal grandmother had "hardening of the arteries". Her father smoked, did not have DM, was thin, had htn, had a stroke at age 27, died at age 20.    Pt states MRI of her back showed arthritis in her spine, she had physical therapy, and the pain in her legs improved.  She has a history of diverticulitis.  Pt states her blood pressure at home is in the 110's/ 70's.   Pt Diabetic: No Pt smoker: non-smoker  Pt meds include:  Statin : No: is atorvastatin and pravastatin intolerant, causes lethargy. Pt states her cholesterol has always been low.  ASA: Yes, 81 mg 3/day  Other anticoagulants/antiplatelets: no    Past Medical History:  Diagnosis Date  . Anemia   . Anxiety   . Arthritis    Osteoarthritis  . Arthritis    ? diag in lower back  . Bursitis    left hip-had injection 2015  . Carotid artery occlusion April 2014   Left Bruit, 80% blockage  . CKD (chronic kidney disease)    Stage III  . Diverticulitis   . Hematuria   . Hypertension   . Interstitial cystitis   . Lower back pain June 2015  . Ocular migraine   . Osteopenia   . PONV (postoperative nausea and vomiting)     Social  History Social History   Tobacco Use  . Smoking status: Never Smoker  . Smokeless tobacco: Never Used  Substance Use Topics  . Alcohol use: No  . Drug use: No    Family History Family History  Problem Relation Age of Onset  . COPD Mother        Respiratory Disease  . Rheumatic fever Mother   . Pulmonary disease Mother   . Heart attack Mother   . Stroke Father   . Hypertension Father   . Diabetes Paternal Aunt   . Prostate cancer Maternal Grandfather   . Hypertension Paternal Aunt   . Hypertension Paternal Grandmother   . Other Maternal Grandmother        hardening arteries  . Stroke Other        paternal side    Surgical History Past Surgical History:  Procedure Laterality Date  . CAROTID ENDARTERECTOMY Right 12-22-12   cea  . ENDARTERECTOMY Right 12/22/2012   Procedure: Right Carotid Endarterectomy with hemashield patch angioplasty;  Surgeon: Pryor Ochoa, MD;  Location: [facility] OR;  Service: Vascular;  Laterality: Right;    Allergies  Allergen Reactions  . Benicar Hct [Olmesartan Medoxomil-Hctz] Swelling    Facial swelling  . Buspar [Buspirone] Other (See Comments)    Facial Dysesthesia  . Norvasc [Amlodipine Besylate] Swelling    legs  . Zoloft [Sertraline  Hcl] Anxiety    Increased anxiety, dysesthesia  . Lipitor [Atorvastatin] Other (See Comments)    Generalized muscle aches  . Lisinopril Swelling    Around eyes  . Motrin [Ibuprofen] Other (See Comments)     Contraindicated with Celebrex. Pt states MD said to not take together.  . Amitriptyline Hcl Anxiety    And Paresthesias  . Pravastatin Other (See Comments)    Pt. Wanted to sleep, no energy.    Current Outpatient Medications  Medication Sig Dispense Refill  . acetaminophen (TYLENOL) 500 MG tablet Take 500 mg by mouth every 6 (six) hours as needed.    . ALPRAZolam (XANAX) 0.25 MG tablet Take 0.125 mg by mouth 2 (two) times daily as needed for anxiety.     Marland Kitchen. aspirin EC 81 MG tablet Take 243 mg by  mouth daily. Pt  Takes x's 3  81 mg tabs per day    . azelastine (ASTELIN) 0.1 % nasal spray Place 2 sprays into both nostrils 2 (two) times daily as needed for rhinitis or allergies. As needed  12  . Calcium-Vitamin D (CALTRATE 600 PLUS-VIT D PO) Take 1 tablet by mouth daily at 12 noon.    . Celecoxib (CELEBREX PO) Take 200 mg by mouth daily as needed (pain). Not more than 2 per week    . Cholecalciferol (VITAMIN D) 2000 UNITS CAPS Take 2,000 Units by mouth daily at 12 noon.     . escitalopram (LEXAPRO) 10 MG tablet TK 1 T PO QD  6  . hydrochlorothiazide (HYDRODIURIL) 25 MG tablet Take 25 mg by mouth daily.   3  . moexipril (UNIVASC) 15 MG tablet Take 15 mg by mouth daily.    . Probiotic Product (ALIGN) 4 MG CAPS Take 4 mg by mouth daily.    . Pseudoephedrine HCl (SUDAFED PO) Take 30 mg by mouth as needed (allergies).      No current facility-administered medications for this visit.     Review of Systems : See HPI for pertinent positives and negatives.  Physical Examination  Vitals:   08/14/17 1304 08/14/17 1312  BP: 135/70 137/66  Pulse: 68 68  Resp: 16   Temp: (!) 97.2 F (36.2 C)   TempSrc: Oral   SpO2: 96%   Weight: 120 lb (54.4 kg)   Height: 4\' 11"  (1.499 m)    Body mass index is 24.24 kg/m.  General: WDWN female in NAD  GAIT:normal  Eyes: PERRLA  Pulmonary: Respirations are non labored, CTAB, no rales, rhonchi, or wheezing.  Cardiac: regular rhythm and rate, no detected murmur.   VASCULAR EXAM Carotid Bruits Left Right   negative positive   Radial pulses are 2+ palpable and equal.  Abdominal aortic pulseis not palpable  LE Pulses  LEFT  RIGHT   POPLITEAL  notpalpable  1+palpable  POSTERIOR TIBIAL  palpable  palpable   DORSALIS PEDIS ANTERIOR TIBIAL  2+ palpable  2+palpable     Gastrointestinal: soft, no tenderness to palpation,BS WNL, no r/g, no palpated masses.  Musculoskeletal: No muscle  atrophy/wasting. M/S 5/5 throughout, Extremities without ischemic changes.  Skin: No rash, no cellulitis, no ulcers noted.  Neurologic: A&O X 3; Appropriate Affect, sensation is normal, speech is normal, CN 2-12 intact except has some hearing loss, is wearing hearing aids, pain and light touch intact in extremities, Motor exam as listed above.    Assessment: Amber Barry is a 77 y.o. female who is s/p right CEA in May, 2014. She has no  history of stroke or TIA.  Dr. Randie Heinz spoke with pt and her daughter on 02-06-17 visit, and showed pt and daughter the images of her neck and the narrowing of the right CCA of concern.  Dr. Randie Heinz offered pt several options to address the stenosis noted on the CTA of her neck: schedule her for surgery to removed the partial blockage, or think about it and discuss with her family and call to schedule an appointment with Dr. Randie Heinz if she decides to have the procedure done, or wait 6 months when she returns for carotid duplex. She chose to wait and will let us know if she wants to schedule with Dr. Randie Heinz any sooner.  DATA  Carotid Duplex (08/14/17): Patent right carotid endarterectomy site with evidence of restenosis at the proximal patch site with extension into the surgical bulb; velocities suggest 60-79% stenosis.  Right distal CCA velocity at 405 cm/s (was 444 cm/s on 01-28-17). Left ICA with <40% stenosis. Bilateral vertebral artery flow is antegrade. Bilateral subclavian artery waveforms are normal. No significant change compared to the exam on 01-28-17.   CTA Head and Neck (02-03-17): Right carotid system: Common carotid artery shows calcified and soft plaque. Just proximal to the endarterectomy site, soft plaque results an luminal narrowing to a diameter of 2 mm. Expected diameter in this location would be 8 mm. This indicates 75% stenosis. There is wide patency through the endarterectomy segment. No stenosis at the distal and or beyond that in the  cervical ICA. Left carotid system: Common carotid artery widely patent to the bifurcation region. There is calcified and soft plaque affecting the ICA bulb. Minimal diameter at the distal bulb is 3 mm. Compared to a more distal cervical ICA diameter of 5 mm, this indicates a 40% stenosis. Cervical ICA is widely patent beyond that. Vertebral arteries: Both vertebral artery origins are widely patent. Both vertebral arteries are widely patent through the cervical region. The right is slightly larger than the left.    Plan: Follow-up in 6 months with Carotid Duplex scan. I advised her to call if she decides that she wants to have the right CCA stenosis addressed by Dr. Randie Heinz. She remains asymptomatic.   I discussed in depth with the patient the nature of atherosclerosis, and emphasized the importance of maximal medical management including strict control of blood pressure, blood glucose, and lipid levels, obtaining regular exercise, and continued cessation of smoking.  The patient is aware that without maximal medical management the underlying atherosclerotic disease process will progress, limiting the benefit of any interventions. The patient was given information about stroke prevention and what symptoms should prompt the patient to seek immediate medical care. Thank you for allowing [place] to participate in this patient's care.  Charisse March, RN, MSN, FNP-C [facility] Office: [phone]  Clinic Physician: Chen/Cain  08/14/17 1:13 PM

## 2017-08-18 ENCOUNTER — Encounter (HOSPITAL_COMMUNITY): Payer: Medicare Other

## 2017-08-18 ENCOUNTER — Ambulatory Visit: Payer: Medicare Other | Admitting: Family

## 2017-09-18 DIAGNOSIS — H40053 Ocular hypertension, bilateral: Secondary | ICD-10-CM | POA: Diagnosis not present

## 2017-09-18 DIAGNOSIS — H2513 Age-related nuclear cataract, bilateral: Secondary | ICD-10-CM | POA: Diagnosis not present

## 2017-09-18 DIAGNOSIS — H25013 Cortical age-related cataract, bilateral: Secondary | ICD-10-CM | POA: Diagnosis not present

## 2017-09-18 DIAGNOSIS — H5203 Hypermetropia, bilateral: Secondary | ICD-10-CM | POA: Diagnosis not present

## 2017-11-18 DIAGNOSIS — R7301 Impaired fasting glucose: Secondary | ICD-10-CM | POA: Diagnosis not present

## 2017-11-18 DIAGNOSIS — F411 Generalized anxiety disorder: Secondary | ICD-10-CM | POA: Diagnosis not present

## 2017-11-18 DIAGNOSIS — J309 Allergic rhinitis, unspecified: Secondary | ICD-10-CM | POA: Diagnosis not present

## 2017-11-18 DIAGNOSIS — N183 Chronic kidney disease, stage 3 (moderate): Secondary | ICD-10-CM | POA: Diagnosis not present

## 2017-11-18 DIAGNOSIS — M1990 Unspecified osteoarthritis, unspecified site: Secondary | ICD-10-CM | POA: Diagnosis not present

## 2017-11-18 DIAGNOSIS — N3010 Interstitial cystitis (chronic) without hematuria: Secondary | ICD-10-CM | POA: Diagnosis not present

## 2017-11-18 DIAGNOSIS — I129 Hypertensive chronic kidney disease with stage 1 through stage 4 chronic kidney disease, or unspecified chronic kidney disease: Secondary | ICD-10-CM | POA: Diagnosis not present

## 2017-11-18 DIAGNOSIS — I779 Disorder of arteries and arterioles, unspecified: Secondary | ICD-10-CM | POA: Diagnosis not present

## 2017-11-18 DIAGNOSIS — G720 Drug-induced myopathy: Secondary | ICD-10-CM | POA: Diagnosis not present

## 2017-11-18 DIAGNOSIS — M8580 Other specified disorders of bone density and structure, unspecified site: Secondary | ICD-10-CM | POA: Diagnosis not present

## 2018-01-07 DIAGNOSIS — M1990 Unspecified osteoarthritis, unspecified site: Secondary | ICD-10-CM | POA: Diagnosis not present

## 2018-01-07 DIAGNOSIS — N183 Chronic kidney disease, stage 3 (moderate): Secondary | ICD-10-CM | POA: Diagnosis not present

## 2018-02-12 ENCOUNTER — Other Ambulatory Visit: Payer: Self-pay

## 2018-02-12 DIAGNOSIS — Z9889 Other specified postprocedural states: Secondary | ICD-10-CM

## 2018-02-12 DIAGNOSIS — I6521 Occlusion and stenosis of right carotid artery: Secondary | ICD-10-CM

## 2018-02-19 ENCOUNTER — Other Ambulatory Visit: Payer: Self-pay

## 2018-02-19 ENCOUNTER — Ambulatory Visit (INDEPENDENT_AMBULATORY_CARE_PROVIDER_SITE_OTHER): Payer: Medicare Other | Admitting: Family

## 2018-02-19 ENCOUNTER — Ambulatory Visit (HOSPITAL_COMMUNITY)
Admission: RE | Admit: 2018-02-19 | Discharge: 2018-02-19 | Disposition: A | Discharge status: Home / Self Care | Payer: Medicare Other | Source: Ambulatory Visit | Attending: Family | Admitting: Family

## 2018-02-19 ENCOUNTER — Encounter: Payer: Self-pay | Admitting: Family

## 2018-02-19 VITALS — BP 156/70 | HR 69 | Temp 97.8°F | Resp 16 | Ht 59.0 in | Wt 123.0 lb

## 2018-02-19 DIAGNOSIS — I6521 Occlusion and stenosis of right carotid artery: Secondary | ICD-10-CM

## 2018-02-19 DIAGNOSIS — I6523 Occlusion and stenosis of bilateral carotid arteries: Secondary | ICD-10-CM | POA: Insufficient documentation

## 2018-02-19 DIAGNOSIS — Z9889 Other specified postprocedural states: Secondary | ICD-10-CM

## 2018-02-19 NOTE — Progress Notes (Signed)
Chief Complaint: Follow up Extracranial Carotid Artery Stenosis   History of Present Illness  Amber Barry is a 77 y.o. female who is s/p right CEA in May, 2014 by Dr. Hart RochesterLawson. CTA of her head and neck was done on 02-03-17 to further define the right CCA velocity of 444 cm/s on 01-28-17.  The patient has no history of TIA or stroke symptoms. Specifically she denies a history of amaurosis fugax or monocular blindness, unilateral facial drooping, hemiplegia, or receptive or expressive aphasia. She denies pain, weakness, tingling, or numbness in either hand or arm.  The patient denies a history of cardiac problems, denies claudication symptomswith walking, denies non-healing wounds.   The patient denies any GI bleeding history, denies easy bruising.  She reports that her maternal grandmother had "hardening of the arteries". Her father smoked, did not have DM, was thin, had htn, had a stroke at age 78, died at age 78.    Pt states MRI of her back showed arthritis in her spine,shehad physical therapy, and the pain in her legs improved.  She has a history of diverticulitis.  Pt states her blood pressure at home is in the 110's/ 70's.   Diabetic: No Tobacco use: non-smoker  Pt meds include:  Statin : No: is atorvastatin and pravastatin intolerant, causes lethargy. Pt states her cholesterol has always been low.  ASA: Yes, 81 mg 3/day  Other anticoagulants/antiplatelets: no     Past Medical History:  Diagnosis Date  . Anemia   . Anxiety   . Arthritis    Osteoarthritis  . Arthritis    ? diag in lower back  . Bursitis    left hip-had injection 2015  . Carotid artery occlusion April 2014   Left Bruit, 80% blockage  . CKD (chronic kidney disease)    Stage III  . Diverticulitis   . Hematuria   . Hypertension   . Interstitial cystitis   . Lower back pain June 2015  . Ocular migraine   . Osteopenia   . PONV (postoperative nausea and vomiting)     Social  History Social History   Tobacco Use  . Smoking status: Never Smoker  . Smokeless tobacco: Never Used  Substance Use Topics  . Alcohol use: No  . Drug use: No    Family History Family History  Problem Relation Age of Onset  . COPD Mother        Respiratory Disease  . Rheumatic fever Mother   . Pulmonary disease Mother   . Heart attack Mother   . Stroke Father   . Hypertension Father   . Diabetes Paternal Aunt   . Prostate cancer Maternal Grandfather   . Hypertension Paternal Aunt   . Hypertension Paternal Grandmother   . Other Maternal Grandmother        hardening arteries  . Stroke Other        paternal side    Surgical History Past Surgical History:  Procedure Laterality Date  . CAROTID ENDARTERECTOMY Right 12-22-12   cea  . ENDARTERECTOMY Right 12/22/2012   Procedure: Right Carotid Endarterectomy with hemashield patch angioplasty;  Surgeon: Pryor OchoaJames D Lawson, MD;  [facility];  Service: Vascular;  Laterality: Right;    Allergies  Allergen Reactions  . Benicar Hct [Olmesartan Medoxomil-Hctz] Swelling    Facial swelling  . Buspar [Buspirone] Other (See Comments)    Facial Dysesthesia  . Norvasc [Amlodipine Besylate] Swelling    legs  . Zoloft [Sertraline Hcl] Anxiety  Increased anxiety, dysesthesia  . Lipitor [Atorvastatin] Other (See Comments)    Generalized muscle aches  . Lisinopril Swelling    Around eyes  . Motrin [Ibuprofen] Other (See Comments)     Contraindicated with Celebrex. Pt states MD said to not take together.  . Amitriptyline Hcl Anxiety    And Paresthesias  . Pravastatin Other (See Comments)    Pt. Wanted to sleep, no energy.    Current Outpatient Medications  Medication Sig Dispense Refill  . acetaminophen (TYLENOL) 500 MG tablet Take 500 mg by mouth every 6 (six) hours as needed.    . ALPRAZolam (XANAX) 0.25 MG tablet Take 0.125 mg by mouth 2 (two) times daily as needed for anxiety.     Marland Kitchen aspirin EC 81 MG tablet Take 243 mg by  mouth daily. Pt  Takes x's 3  81 mg tabs per day    . azelastine (ASTELIN) 0.1 % nasal spray Place 2 sprays into both nostrils 2 (two) times daily as needed for rhinitis or allergies. As needed  12  . Calcium-Vitamin D (CALTRATE 600 PLUS-VIT D PO) Take 1 tablet by mouth daily at 12 noon.    . Celecoxib (CELEBREX PO) Take 200 mg by mouth daily as needed (pain). Not more than 2 per week    . Cholecalciferol (VITAMIN D) 2000 UNITS CAPS Take 2,000 Units by mouth daily at 12 noon.     . escitalopram (LEXAPRO) 10 MG tablet TK 1 T PO QD  6  . hydrochlorothiazide (HYDRODIURIL) 25 MG tablet Take 25 mg by mouth daily.   3  . moexipril (UNIVASC) 15 MG tablet Take 15 mg by mouth daily.    . Probiotic Product (ALIGN) 4 MG CAPS Take 4 mg by mouth daily.    . Pseudoephedrine HCl (SUDAFED PO) Take 30 mg by mouth as needed (allergies).      No current facility-administered medications for this visit.     Review of Systems : See HPI for pertinent positives and negatives.  Physical Examination  Vitals:   02/19/18 1031 02/19/18 1034  BP: (!) 159/76 (!) 156/70  Pulse: 69   Resp: 16   Temp: 97.8 F (36.6 C)   TempSrc: Oral   SpO2: 99%   Weight: 123 lb (55.8 kg)   Height: 4\' 11"  (1.499 m)    Body mass index is 24.84 kg/m.  General: WDWN female in NAD GAIT: normal Eyes: PERRLA HENT: No gross abnormalities.  Pulmonary:  Respirations are non-labored, good air movement in all fields, CTAB, no rales, rhonchi, or wheezing. Cardiac: regular rhythm, no detected murmur.  VASCULAR EXAM Carotid Bruits Right Left   Positive Negative     Abdominal aortic pulse is not palpable. Radial pulses are 2+ palpable and equal.                                                                                                                            LE Pulses Right Left  POPLITEAL  not palpable   not palpable       POSTERIOR TIBIAL  not palpable   2+ palpable        DORSALIS PEDIS      ANTERIOR TIBIAL 2+  palpable  2+ palpable     Gastrointestinal: soft, nontender, BS WNL, no r/g, no palpable masses. Musculoskeletal: no muscle atrophy/wasting. M/S 5/5 throughout, extremities without ischemic changes. Skin: No rashes, no ulcers, no cellulitis.   Neurologic:  A&O X 3; appropriate affect, sensation is normal; speech is normal, CN 2-12 intact except has some hearing loss with hearing aids in place, pain and light touch intact in extremities, motor exam as listed above. Psychiatric: Normal thought content, mood appropriate to clinical situation.     Assessment: CYNETHIA SCHINDLER is a 77 y.o. female who is s/p right CEA in May, 2014. She has no history of stroke or TIA.  Fortunately she has never used tobacco and does not have DM. She is statin intolerant. She takes a daily 81 mg ASA.   Dr. Randie Heinz spoke with pt and her daughter, will obtain CTA of head and neck, and pt will return to discuss results with Dr. Randie Heinz and options to address stable right CCA stenosis.    DATA  Carotid Duplex (02/19/18): Right ICA: CEA site with 1-39% stenosis Right CCA: 424 cm/s PSV, compared to 404 cm/s on 08-14-17, and 444 cm/s on 01-28-17. Left ICA: 1-39% stenosis Bilateral vertebral artery flow is antegrade.  Bilateral subclavian artery waveforms are normal.  No significant change compared to the exams on 08-14-17 and 01-28-17.    CTA Head and Neck (02-03-17): Right carotid system: Common carotid artery shows calcified and soft plaque. Just proximal to the endarterectomy site, soft plaque results an luminal narrowing to a diameter of 2 mm. Expected diameter in this location would be 8 mm. This indicates 75% stenosis. There is wide patency through the endarterectomy segment. No stenosis at the distal and or beyond that in the cervical ICA. Left carotid system: Common carotid artery widely patent to the bifurcation region. There is calcified and soft plaque affecting the ICA bulb. Minimal diameter at the  distal bulb is 3 mm. Compared to a more distal cervical ICA diameter of 5 mm, this indicates a 40% stenosis. Cervical ICA is widely patent beyond that. Vertebral arteries: Both vertebral artery origins are widely patent. Both vertebral arteries are widely patent through the cervical region. The right is slightly larger than the left.    Plan:  Schedule CTA head and neck in 1-3 weeks, see Dr. Randie Heinz afterward.   I discussed in depth with the patient the nature of atherosclerosis, and emphasized the importance of maximal medical management including strict control of blood pressure, blood glucose, and lipid levels, obtaining regular exercise, and continued cessation of smoking.  The patient is aware that without maximal medical management the underlying atherosclerotic disease process will progress, limiting the benefit of any interventions. The patient was given information about stroke prevention and what symptoms should prompt the patient to seek immediate medical care. Thank you for allowing [place] to participate in this patient's care.  Charisse March, RN, MSN, FNP-C [facility] Office: [phone]  Clinic Physician: Randie Heinz  02/19/18 10:35 AM

## 2018-02-19 NOTE — Patient Instructions (Signed)

## 2018-03-05 ENCOUNTER — Ambulatory Visit
Admission: RE | Admit: 2018-03-05 | Discharge: 2018-03-05 | Disposition: A | Discharge status: Home / Self Care | Payer: Medicare Other | Source: Ambulatory Visit | Attending: Vascular Surgery | Admitting: Vascular Surgery

## 2018-03-05 DIAGNOSIS — I6521 Occlusion and stenosis of right carotid artery: Secondary | ICD-10-CM

## 2018-03-05 DIAGNOSIS — I6523 Occlusion and stenosis of bilateral carotid arteries: Secondary | ICD-10-CM

## 2018-03-05 DIAGNOSIS — Z9889 Other specified postprocedural states: Secondary | ICD-10-CM

## 2018-03-05 MED ORDER — IOPAMIDOL (ISOVUE-370) INJECTION 76%
75.0000 mL | Freq: Once | INTRAVENOUS | Status: AC | PRN
  Administered 2018-03-05: 75 mL via INTRAVENOUS

## 2018-03-12 ENCOUNTER — Ambulatory Visit (INDEPENDENT_AMBULATORY_CARE_PROVIDER_SITE_OTHER): Payer: Medicare Other | Admitting: Vascular Surgery

## 2018-03-12 ENCOUNTER — Other Ambulatory Visit: Payer: Self-pay

## 2018-03-12 ENCOUNTER — Encounter: Payer: Self-pay | Admitting: Vascular Surgery

## 2018-03-12 VITALS — BP 126/77 | HR 80 | Temp 98.1°F | Resp 14 | Ht 59.0 in | Wt 121.0 lb

## 2018-03-12 DIAGNOSIS — Z9889 Other specified postprocedural states: Secondary | ICD-10-CM

## 2018-03-12 DIAGNOSIS — I6521 Occlusion and stenosis of right carotid artery: Secondary | ICD-10-CM | POA: Diagnosis not present

## 2018-03-12 NOTE — Progress Notes (Signed)
Patient ID: LASHUNTA FRIEDEN, female   DOB: [birth year], 77 y.o.   MRN: [id number]  Reason for Consult: Carotid (f/u  CTA )   Referred by Blair Heys, MD  Subjective:     HPI:  TERSEA AULDS is a 77 y.o. female with a history of right carotid endarterectomy in 2014.  She has known high velocities in her common carotid artery and has been followed with CT scan.  She was recently evaluated in our office 1 month ago and is now sent for follow-up with CT angios for which she presents to discuss the results today.  She has not had any stroke TIA or amaurosis.  She remains on aspirin.  Past Medical History:  Diagnosis Date  . Anemia   . Anxiety   . Arthritis    Osteoarthritis  . Arthritis    ? diag in lower back  . Bursitis    left hip-had injection 2015  . Carotid artery occlusion April 2014   Left Bruit, 80% blockage  . CKD (chronic kidney disease)    Stage III  . Diverticulitis   . Hematuria   . Hypertension   . Interstitial cystitis   . Lower back pain June 2015  . Ocular migraine   . Osteopenia   . PONV (postoperative nausea and vomiting)    Family History  Problem Relation Age of Onset  . COPD Mother        Respiratory Disease  . Rheumatic fever Mother   . Pulmonary disease Mother   . Heart attack Mother   . Stroke Father   . Hypertension Father   . Diabetes Paternal Aunt   . Prostate cancer Maternal Grandfather   . Hypertension Paternal Aunt   . Hypertension Paternal Grandmother   . Other Maternal Grandmother        hardening arteries  . Stroke Other        paternal side   Past Surgical History:  Procedure Laterality Date  . CAROTID ENDARTERECTOMY Right 12-22-12   cea  . ENDARTERECTOMY Right 12/22/2012   Procedure: Right Carotid Endarterectomy with hemashield patch angioplasty;  Surgeon: Pryor Ochoa, MD;  Location: [facility] OR;  Service: Vascular;  Laterality: Right;    Short Social History:  Social History   Tobacco Use  . Smoking status: Never Smoker   . Smokeless tobacco: Never Used  Substance Use Topics  . Alcohol use: No    Allergies  Allergen Reactions  . Benicar Hct [Olmesartan Medoxomil-Hctz] Swelling    Facial swelling  . Buspar [Buspirone] Other (See Comments)    Facial Dysesthesia  . Norvasc [Amlodipine Besylate] Swelling    legs  . Zoloft [Sertraline Hcl] Anxiety    Increased anxiety, dysesthesia  . Lipitor [Atorvastatin] Other (See Comments)    Generalized muscle aches  . Lisinopril Swelling    Around eyes  . Motrin [Ibuprofen] Other (See Comments)     Contraindicated with Celebrex. Pt states MD said to not take together.  . Amitriptyline Hcl Anxiety    And Paresthesias  . Pravastatin Other (See Comments)    Pt. Wanted to sleep, no energy.    Current Outpatient Medications  Medication Sig Dispense Refill  . acetaminophen (TYLENOL) 500 MG tablet Take 500 mg by mouth every 6 (six) hours as needed.    . ALPRAZolam (XANAX) 0.25 MG tablet Take 0.125 mg by mouth 2 (two) times daily as needed for anxiety.     Marland Kitchen aspirin EC 81 MG tablet Take 243  mg by mouth daily. Pt  Takes x's 3  81 mg tabs per day    . azelastine (ASTELIN) 0.1 % nasal spray Place 2 sprays into both nostrils 2 (two) times daily as needed for rhinitis or allergies. As needed  12  . Calcium-Vitamin D (CALTRATE 600 PLUS-VIT D PO) Take 1 tablet by mouth daily at 12 noon.    . Celecoxib (CELEBREX PO) Take 200 mg by mouth daily as needed (pain). Not more than 2 per week    . Cholecalciferol (VITAMIN D) 2000 UNITS CAPS Take 2,000 Units by mouth daily at 12 noon.     . escitalopram (LEXAPRO) 10 MG tablet TK 1 T PO QD  6  . hydrochlorothiazide (HYDRODIURIL) 25 MG tablet Take 25 mg by mouth daily.   3  . moexipril (UNIVASC) 15 MG tablet Take 15 mg by mouth daily.    . Probiotic Product (ALIGN) 4 MG CAPS Take 4 mg by mouth daily.    . Pseudoephedrine HCl (SUDAFED PO) Take 30 mg by mouth as needed (allergies).      No current facility-administered medications for  this visit.     Review of Systems  Constitutional:  Constitutional negative. HENT: HENT negative.  Eyes: Eyes negative.  Respiratory: Respiratory negative.  Cardiovascular: Cardiovascular negative.  GI: Gastrointestinal negative.  Musculoskeletal: Musculoskeletal negative.  Skin: Skin negative.  Neurological: Neurological negative. Hematologic: Hematologic/lymphatic negative.  Psychiatric: Psychiatric negative.        Objective:  Objective   Vitals:   03/12/18 0855 03/12/18 0858  BP: 125/71 126/77  Pulse: 79 80  Resp: 14   Temp: 98.1 F (36.7 C)   SpO2: 99%   Weight: 121 lb (54.9 kg)   Height: 4\' 11"  (1.499 m)    Body mass index is 24.44 kg/m.  Physical Exam  Constitutional: She is oriented to person, place, and time. She appears well-developed.  HENT:  Head: Normocephalic.  Well-healed right neck incision  Eyes: Pupils are equal, round, and reactive to light.  Neck: Normal range of motion. Neck supple.  Cardiovascular: Normal rate.  Pulses:      Radial pulses are 2+ on the right side, and 2+ on the left side.       Popliteal pulses are 2+ on the right side, and 2+ on the left side.  Pulmonary/Chest: Effort normal.  Abdominal: Soft.  Musculoskeletal: Normal range of motion. She exhibits no edema.  Neurological: She is alert and oriented to person, place, and time. No cranial nerve deficit.  Skin: Skin is warm and dry.  Psychiatric: She has a normal mood and affect. Her behavior is normal. Judgment and thought content normal.    Data: I reviewed her CT angios with her and her family member which demonstrates a diameter of 1.8 mm at the common carotid artery likely resulting from a clamp site.  There is no calcification appears to be smooth plaque there and on previous CT scan was 2.0 mm.  Grossly the scan is unchanged.     Assessment/Plan:     77 year old female with a history of right carotid endarterectomy now with tight stenosis in her common carotid  artery.  She is asymptomatic from this and remains on aspirin.  We discussed the options being open repair versus continued watchful waiting.  I do not think she is a great stent candidate given the dilatation of the artery distally where she has a patch angioplasty and will be very difficult to get a big enough stent in  that area.  I told her that she would have risk including a 2% risk of stroke and risk of cranial nerve injury or injury to surrounding structures.  I am unsure of the benefit given that there is not good data on common carotid stenosis with elevated velocities specifically since this appears to be smooth on both duplex and CT scan.  At this time she is satisfied to continue watchful waiting and we will see her in 1 year with repeat duplex.  If there are any issues between now and then she will certainly report them and we can repair this with an open operation.     Maeola HarmanBrandon Christopher Malay Fantroy MD [facility]

## 2018-03-31 DIAGNOSIS — M79605 Pain in left leg: Secondary | ICD-10-CM | POA: Diagnosis not present

## 2018-04-13 DIAGNOSIS — M79605 Pain in left leg: Secondary | ICD-10-CM | POA: Diagnosis not present

## 2018-04-19 ENCOUNTER — Other Ambulatory Visit: Payer: Self-pay | Admitting: Orthopedic Surgery

## 2018-04-19 DIAGNOSIS — M545 Low back pain: Secondary | ICD-10-CM

## 2018-04-22 ENCOUNTER — Ambulatory Visit
Admission: RE | Admit: 2018-04-22 | Discharge: 2018-04-22 | Disposition: A | Discharge status: Home / Self Care | Payer: Medicare Other | Source: Ambulatory Visit | Attending: Orthopedic Surgery | Admitting: Orthopedic Surgery

## 2018-04-22 DIAGNOSIS — M48061 Spinal stenosis, lumbar region without neurogenic claudication: Secondary | ICD-10-CM | POA: Diagnosis not present

## 2018-04-22 DIAGNOSIS — M545 Low back pain: Secondary | ICD-10-CM

## 2018-05-05 DIAGNOSIS — M5416 Radiculopathy, lumbar region: Secondary | ICD-10-CM | POA: Diagnosis not present

## 2018-05-05 DIAGNOSIS — M48061 Spinal stenosis, lumbar region without neurogenic claudication: Secondary | ICD-10-CM | POA: Diagnosis not present

## 2018-05-05 DIAGNOSIS — M545 Low back pain: Secondary | ICD-10-CM | POA: Diagnosis not present

## 2018-05-21 DIAGNOSIS — M48061 Spinal stenosis, lumbar region without neurogenic claudication: Secondary | ICD-10-CM | POA: Diagnosis not present

## 2018-05-21 DIAGNOSIS — M545 Low back pain: Secondary | ICD-10-CM | POA: Diagnosis not present

## 2018-05-21 DIAGNOSIS — M5416 Radiculopathy, lumbar region: Secondary | ICD-10-CM | POA: Diagnosis not present

## 2018-06-14 DIAGNOSIS — M5416 Radiculopathy, lumbar region: Secondary | ICD-10-CM | POA: Diagnosis not present

## 2018-06-14 DIAGNOSIS — M545 Low back pain: Secondary | ICD-10-CM | POA: Diagnosis not present

## 2018-06-14 DIAGNOSIS — M48061 Spinal stenosis, lumbar region without neurogenic claudication: Secondary | ICD-10-CM | POA: Diagnosis not present

## 2018-06-25 ENCOUNTER — Other Ambulatory Visit: Payer: Self-pay | Admitting: Obstetrics & Gynecology

## 2018-06-25 DIAGNOSIS — F411 Generalized anxiety disorder: Secondary | ICD-10-CM | POA: Diagnosis not present

## 2018-06-25 DIAGNOSIS — M8580 Other specified disorders of bone density and structure, unspecified site: Secondary | ICD-10-CM | POA: Diagnosis not present

## 2018-06-25 DIAGNOSIS — Z1231 Encounter for screening mammogram for malignant neoplasm of breast: Secondary | ICD-10-CM

## 2018-06-25 DIAGNOSIS — I779 Disorder of arteries and arterioles, unspecified: Secondary | ICD-10-CM | POA: Diagnosis not present

## 2018-06-25 DIAGNOSIS — N3010 Interstitial cystitis (chronic) without hematuria: Secondary | ICD-10-CM | POA: Diagnosis not present

## 2018-06-25 DIAGNOSIS — R7301 Impaired fasting glucose: Secondary | ICD-10-CM | POA: Diagnosis not present

## 2018-06-25 DIAGNOSIS — Z23 Encounter for immunization: Secondary | ICD-10-CM | POA: Diagnosis not present

## 2018-06-25 DIAGNOSIS — M1990 Unspecified osteoarthritis, unspecified site: Secondary | ICD-10-CM | POA: Diagnosis not present

## 2018-06-25 DIAGNOSIS — N183 Chronic kidney disease, stage 3 (moderate): Secondary | ICD-10-CM | POA: Diagnosis not present

## 2018-06-25 DIAGNOSIS — I129 Hypertensive chronic kidney disease with stage 1 through stage 4 chronic kidney disease, or unspecified chronic kidney disease: Secondary | ICD-10-CM | POA: Diagnosis not present

## 2018-06-25 DIAGNOSIS — G720 Drug-induced myopathy: Secondary | ICD-10-CM | POA: Diagnosis not present

## 2018-06-25 DIAGNOSIS — Z0000 Encounter for general adult medical examination without abnormal findings: Secondary | ICD-10-CM | POA: Diagnosis not present

## 2018-06-25 DIAGNOSIS — Z1389 Encounter for screening for other disorder: Secondary | ICD-10-CM | POA: Diagnosis not present

## 2018-07-02 DIAGNOSIS — M5416 Radiculopathy, lumbar region: Secondary | ICD-10-CM | POA: Diagnosis not present

## 2018-07-02 DIAGNOSIS — M545 Low back pain: Secondary | ICD-10-CM | POA: Diagnosis not present

## 2018-07-02 DIAGNOSIS — M48061 Spinal stenosis, lumbar region without neurogenic claudication: Secondary | ICD-10-CM | POA: Diagnosis not present

## 2018-07-29 DIAGNOSIS — M2560 Stiffness of unspecified joint, not elsewhere classified: Secondary | ICD-10-CM | POA: Diagnosis not present

## 2018-07-29 DIAGNOSIS — M79605 Pain in left leg: Secondary | ICD-10-CM | POA: Diagnosis not present

## 2018-07-29 DIAGNOSIS — M545 Low back pain: Secondary | ICD-10-CM | POA: Diagnosis not present

## 2018-07-29 DIAGNOSIS — M6281 Muscle weakness (generalized): Secondary | ICD-10-CM | POA: Diagnosis not present

## 2018-08-02 DIAGNOSIS — M545 Low back pain: Secondary | ICD-10-CM | POA: Diagnosis not present

## 2018-08-02 DIAGNOSIS — M48061 Spinal stenosis, lumbar region without neurogenic claudication: Secondary | ICD-10-CM | POA: Diagnosis not present

## 2018-08-02 DIAGNOSIS — M5416 Radiculopathy, lumbar region: Secondary | ICD-10-CM | POA: Diagnosis not present

## 2018-08-06 NOTE — Progress Notes (Signed)
77 y.o. G2P2 Widowed White or Caucasian female here for annual exam.  Doing well.  Daughter just had ruptured ear drum and son just had passed kidney stones.  He is going to have lithotripsy last week.    Denies vaginal bleeding.     Carotid artery is being followed very carefully.  Has appt in July.    Having some low back issues and left back of leg pain.  Was given steroid taper which helped a little.  Saw Dr. Eulah PontMurphy and she is having injections with physical therapy.  This has helped pain.  PT causes a lot of soreness for her.   PCP:  Dr. Manus GunningEhinger.  Has appt due in November.  Blood work done with last visit.   Patient's last menstrual period was 08/11/2002.          Sexually active: No.  The current method of family planning is post menopausal status.    Exercising: No.  The patient does not participate in regular exercise at present. Smoker:  no  Health Maintenance: Pap: 05/28/17 neg  11/28/13 neg 05/09/10 neg  History of abnormal Pap:  no MMG:  01/14/17 Density C Bi-rads 1 neg.  Has appt today for MMG. Colonoscopy:  11/17/16 [facility], no repeat per patient BMD:   11/03/14 osteopenia, repeat with MMG nedxt year TDaP:  05/08/11 Pneumonia vaccine(s):  05/12/05, 05/11/14 Shingrix:   5/18, 9/18 Hep C testing: no Screening Labs:PCP, Hb today: PCP,    reports that she has never smoked. She has never used smokeless tobacco. She reports that she does not drink alcohol or use drugs.  Past Medical History:  Diagnosis Date  . Anemia   . Anxiety   . Arthritis    Osteoarthritis  . Arthritis    ? diag in lower back  . Bursitis    left hip-had injection 2015  . Carotid artery occlusion April 2014   Left Bruit, 80% blockage  . CKD (chronic kidney disease)    Stage III  . Diverticulitis   . Hematuria   . Hypertension   . Interstitial cystitis   . Lower back pain June 2015  . Ocular migraine   . Osteopenia   . PONV (postoperative nausea and vomiting)     Past Surgical History:  Procedure  Laterality Date  . CAROTID ENDARTERECTOMY Right 12-22-12   cea  . ENDARTERECTOMY Right 12/22/2012   Procedure: Right Carotid Endarterectomy with hemashield patch angioplasty;  Surgeon: Pryor OchoaJames D Lawson, MD;  [facility];  Service: Vascular;  Laterality: Right;    Current Outpatient Medications  Medication Sig Dispense Refill  . Celecoxib (CELEBREX PO) Take 200 mg by mouth daily as needed (pain). Not more than 2 per week    . Cholecalciferol (VITAMIN D) 2000 UNITS CAPS Take 2,000 Units by mouth daily at 12 noon.     . escitalopram (LEXAPRO) 10 MG tablet TK 1 T PO QD  6  . hydrochlorothiazide (HYDRODIURIL) 25 MG tablet Take 25 mg by mouth daily.   3  . moexipril (UNIVASC) 15 MG tablet Take 15 mg by mouth daily.    . Probiotic Product (ALIGN) 4 MG CAPS Take 4 mg by mouth daily.    . Pseudoephedrine HCl (SUDAFED PO) Take 30 mg by mouth as needed (allergies).     Marland Kitchen. acetaminophen (TYLENOL) 500 MG tablet Take 500 mg by mouth every 6 (six) hours as needed.    . ALPRAZolam (XANAX) 0.25 MG tablet Take 0.125 mg by mouth 2 (two)  times daily as needed for anxiety.     Marland Kitchen aspirin EC 81 MG tablet Take 243 mg by mouth daily. Pt  Takes x's 3  81 mg tabs per day    . azelastine (ASTELIN) 0.1 % nasal spray Place 2 sprays into both nostrils 2 (two) times daily as needed for rhinitis or allergies. As needed  12  . Calcium-Vitamin D (CALTRATE 600 PLUS-VIT D PO) Take 1 tablet by mouth daily at 12 noon.     No current facility-administered medications for this visit.     Family History  Problem Relation Age of Onset  . COPD Mother        Respiratory Disease  . Rheumatic fever Mother   . Pulmonary disease Mother   . Heart attack Mother   . Stroke Father   . Hypertension Father   . Diabetes Paternal Aunt   . Prostate cancer Maternal Grandfather   . Hypertension Paternal Aunt   . Hypertension Paternal Grandmother   . Other Maternal Grandmother        hardening arteries  . Stroke Other        paternal  side    Review of Systems  All other systems reviewed and are negative.   Exam:   BP 122/68   Pulse 74   Resp 14   Ht 4' 10.5" (1.486 m)   Wt 124 lb (56.2 kg)   LMP 08/11/2002   BMI 25.47 kg/m   Height: 4' 10.5" (148.6 cm)  Ht Readings from Last 3 Encounters:  08/12/18 4' 10.5" (1.486 m)  03/12/18 4\' 11"  (1.499 m)  02/19/18 4\' 11"  (1.499 m)    General appearance: alert, cooperative and appears stated age Head: Normocephalic, without obvious abnormality, atraumatic Neck: no adenopathy, supple, symmetrical, trachea midline and thyroid normal to inspection and palpation Lungs: clear to auscultation bilaterally Breasts: normal appearance, no masses or tenderness Heart: regular rate and rhythm Abdomen: soft, non-tender; bowel sounds normal; no masses,  no organomegaly Extremities: extremities normal, atraumatic, no cyanosis or edema Skin: Skin color, texture, turgor normal. No rashes or lesions Lymph nodes: Cervical, supraclavicular, and axillary nodes normal. No abnormal inguinal nodes palpated Neurologic: Grossly normal   Pelvic: External genitalia:  no lesions              Urethra:  normal appearing urethra with no masses, tenderness or lesions              Bartholins and Skenes: normal                 Vagina: normal appearing vagina with normal color and discharge, no lesions              Cervix: no lesions              Pap taken: No. Bimanual Exam:  Uterus:  normal size, contour, position, consistency, mobility, non-tender              Adnexa: normal adnexa and no mass, fullness, tenderness               Rectovaginal: Confirms               Anus:  normal sphincter tone, no lesions  Chaperone was present for exam.  A:  Well Woman with normal exam PMP, no HRT Hypertension Carotid plaques, followed by  Osteopenia H/O diverticulitis 12/17  P:   Mammogram guidelines reviewed.  She does have appt scheduled. pap smear not indicated Plan BMD with  next MMG as her MMG  is scheduled for this early afternoon. Lab work done with Dr. Manus GunningEhinger Vaccines are UTD return annually or prn

## 2018-08-09 DIAGNOSIS — M79605 Pain in left leg: Secondary | ICD-10-CM | POA: Diagnosis not present

## 2018-08-09 DIAGNOSIS — M545 Low back pain: Secondary | ICD-10-CM | POA: Diagnosis not present

## 2018-08-09 DIAGNOSIS — M2560 Stiffness of unspecified joint, not elsewhere classified: Secondary | ICD-10-CM | POA: Diagnosis not present

## 2018-08-09 DIAGNOSIS — M6281 Muscle weakness (generalized): Secondary | ICD-10-CM | POA: Diagnosis not present

## 2018-08-12 ENCOUNTER — Ambulatory Visit (INDEPENDENT_AMBULATORY_CARE_PROVIDER_SITE_OTHER): Payer: Medicare Other | Admitting: Obstetrics & Gynecology

## 2018-08-12 ENCOUNTER — Encounter: Payer: Self-pay | Admitting: Obstetrics & Gynecology

## 2018-08-12 ENCOUNTER — Ambulatory Visit
Admission: RE | Admit: 2018-08-12 | Discharge: 2018-08-12 | Disposition: A | Discharge status: Home / Self Care | Payer: Medicare Other | Source: Ambulatory Visit | Attending: Obstetrics & Gynecology | Admitting: Obstetrics & Gynecology

## 2018-08-12 VITALS — BP 122/68 | HR 74 | Resp 14 | Ht 58.5 in | Wt 124.0 lb

## 2018-08-12 DIAGNOSIS — Z1231 Encounter for screening mammogram for malignant neoplasm of breast: Secondary | ICD-10-CM | POA: Diagnosis not present

## 2018-08-12 DIAGNOSIS — M8580 Other specified disorders of bone density and structure, unspecified site: Secondary | ICD-10-CM | POA: Insufficient documentation

## 2018-08-12 DIAGNOSIS — Z124 Encounter for screening for malignant neoplasm of cervix: Secondary | ICD-10-CM

## 2018-08-12 DIAGNOSIS — I10 Essential (primary) hypertension: Secondary | ICD-10-CM | POA: Diagnosis not present

## 2018-08-12 DIAGNOSIS — Z01419 Encounter for gynecological examination (general) (routine) without abnormal findings: Secondary | ICD-10-CM

## 2018-08-13 DIAGNOSIS — M79605 Pain in left leg: Secondary | ICD-10-CM | POA: Diagnosis not present

## 2018-08-13 DIAGNOSIS — M545 Low back pain: Secondary | ICD-10-CM | POA: Diagnosis not present

## 2018-08-13 DIAGNOSIS — M6281 Muscle weakness (generalized): Secondary | ICD-10-CM | POA: Diagnosis not present

## 2018-08-13 DIAGNOSIS — M2560 Stiffness of unspecified joint, not elsewhere classified: Secondary | ICD-10-CM | POA: Diagnosis not present

## 2018-08-16 DIAGNOSIS — M79605 Pain in left leg: Secondary | ICD-10-CM | POA: Diagnosis not present

## 2018-08-16 DIAGNOSIS — M545 Low back pain: Secondary | ICD-10-CM | POA: Diagnosis not present

## 2018-08-16 DIAGNOSIS — M6281 Muscle weakness (generalized): Secondary | ICD-10-CM | POA: Diagnosis not present

## 2018-08-16 DIAGNOSIS — M2560 Stiffness of unspecified joint, not elsewhere classified: Secondary | ICD-10-CM | POA: Diagnosis not present

## 2018-08-19 DIAGNOSIS — M545 Low back pain: Secondary | ICD-10-CM | POA: Diagnosis not present

## 2018-08-19 DIAGNOSIS — M2560 Stiffness of unspecified joint, not elsewhere classified: Secondary | ICD-10-CM | POA: Diagnosis not present

## 2018-08-19 DIAGNOSIS — M79605 Pain in left leg: Secondary | ICD-10-CM | POA: Diagnosis not present

## 2018-08-19 DIAGNOSIS — M6281 Muscle weakness (generalized): Secondary | ICD-10-CM | POA: Diagnosis not present

## 2018-08-23 DIAGNOSIS — M6281 Muscle weakness (generalized): Secondary | ICD-10-CM | POA: Diagnosis not present

## 2018-08-23 DIAGNOSIS — M545 Low back pain: Secondary | ICD-10-CM | POA: Diagnosis not present

## 2018-08-23 DIAGNOSIS — M2560 Stiffness of unspecified joint, not elsewhere classified: Secondary | ICD-10-CM | POA: Diagnosis not present

## 2018-08-23 DIAGNOSIS — M79605 Pain in left leg: Secondary | ICD-10-CM | POA: Diagnosis not present

## 2018-08-26 DIAGNOSIS — M79605 Pain in left leg: Secondary | ICD-10-CM | POA: Diagnosis not present

## 2018-08-26 DIAGNOSIS — M545 Low back pain: Secondary | ICD-10-CM | POA: Diagnosis not present

## 2018-08-26 DIAGNOSIS — M6281 Muscle weakness (generalized): Secondary | ICD-10-CM | POA: Diagnosis not present

## 2018-08-26 DIAGNOSIS — M2560 Stiffness of unspecified joint, not elsewhere classified: Secondary | ICD-10-CM | POA: Diagnosis not present

## 2018-08-30 DIAGNOSIS — M79605 Pain in left leg: Secondary | ICD-10-CM | POA: Diagnosis not present

## 2018-08-30 DIAGNOSIS — M6281 Muscle weakness (generalized): Secondary | ICD-10-CM | POA: Diagnosis not present

## 2018-08-30 DIAGNOSIS — M2560 Stiffness of unspecified joint, not elsewhere classified: Secondary | ICD-10-CM | POA: Diagnosis not present

## 2018-08-30 DIAGNOSIS — M545 Low back pain: Secondary | ICD-10-CM | POA: Diagnosis not present

## 2018-09-02 DIAGNOSIS — M545 Low back pain: Secondary | ICD-10-CM | POA: Diagnosis not present

## 2018-09-02 DIAGNOSIS — M6281 Muscle weakness (generalized): Secondary | ICD-10-CM | POA: Diagnosis not present

## 2018-09-02 DIAGNOSIS — M79605 Pain in left leg: Secondary | ICD-10-CM | POA: Diagnosis not present

## 2018-09-02 DIAGNOSIS — M2560 Stiffness of unspecified joint, not elsewhere classified: Secondary | ICD-10-CM | POA: Diagnosis not present

## 2018-09-06 DIAGNOSIS — M6281 Muscle weakness (generalized): Secondary | ICD-10-CM | POA: Diagnosis not present

## 2018-09-06 DIAGNOSIS — M79605 Pain in left leg: Secondary | ICD-10-CM | POA: Diagnosis not present

## 2018-09-06 DIAGNOSIS — M2560 Stiffness of unspecified joint, not elsewhere classified: Secondary | ICD-10-CM | POA: Diagnosis not present

## 2018-09-06 DIAGNOSIS — M545 Low back pain: Secondary | ICD-10-CM | POA: Diagnosis not present

## 2018-09-09 DIAGNOSIS — M79605 Pain in left leg: Secondary | ICD-10-CM | POA: Diagnosis not present

## 2018-09-09 DIAGNOSIS — M2560 Stiffness of unspecified joint, not elsewhere classified: Secondary | ICD-10-CM | POA: Diagnosis not present

## 2018-09-09 DIAGNOSIS — M6281 Muscle weakness (generalized): Secondary | ICD-10-CM | POA: Diagnosis not present

## 2018-09-09 DIAGNOSIS — M545 Low back pain: Secondary | ICD-10-CM | POA: Diagnosis not present

## 2018-09-13 DIAGNOSIS — M6281 Muscle weakness (generalized): Secondary | ICD-10-CM | POA: Diagnosis not present

## 2018-09-13 DIAGNOSIS — M79605 Pain in left leg: Secondary | ICD-10-CM | POA: Diagnosis not present

## 2018-09-13 DIAGNOSIS — M2560 Stiffness of unspecified joint, not elsewhere classified: Secondary | ICD-10-CM | POA: Diagnosis not present

## 2018-09-13 DIAGNOSIS — M545 Low back pain: Secondary | ICD-10-CM | POA: Diagnosis not present

## 2018-09-20 DIAGNOSIS — M79605 Pain in left leg: Secondary | ICD-10-CM | POA: Diagnosis not present

## 2018-09-20 DIAGNOSIS — M545 Low back pain: Secondary | ICD-10-CM | POA: Diagnosis not present

## 2018-09-20 DIAGNOSIS — M6281 Muscle weakness (generalized): Secondary | ICD-10-CM | POA: Diagnosis not present

## 2018-09-20 DIAGNOSIS — M2560 Stiffness of unspecified joint, not elsewhere classified: Secondary | ICD-10-CM | POA: Diagnosis not present

## 2018-09-27 DIAGNOSIS — H40051 Ocular hypertension, right eye: Secondary | ICD-10-CM | POA: Diagnosis not present

## 2018-09-27 DIAGNOSIS — H25013 Cortical age-related cataract, bilateral: Secondary | ICD-10-CM | POA: Diagnosis not present

## 2018-09-27 DIAGNOSIS — H5203 Hypermetropia, bilateral: Secondary | ICD-10-CM | POA: Diagnosis not present

## 2018-09-27 DIAGNOSIS — H2513 Age-related nuclear cataract, bilateral: Secondary | ICD-10-CM | POA: Diagnosis not present

## 2018-12-29 DIAGNOSIS — M8580 Other specified disorders of bone density and structure, unspecified site: Secondary | ICD-10-CM | POA: Diagnosis not present

## 2018-12-29 DIAGNOSIS — N3010 Interstitial cystitis (chronic) without hematuria: Secondary | ICD-10-CM | POA: Diagnosis not present

## 2018-12-29 DIAGNOSIS — R7301 Impaired fasting glucose: Secondary | ICD-10-CM | POA: Diagnosis not present

## 2018-12-29 DIAGNOSIS — G720 Drug-induced myopathy: Secondary | ICD-10-CM | POA: Diagnosis not present

## 2018-12-29 DIAGNOSIS — J309 Allergic rhinitis, unspecified: Secondary | ICD-10-CM | POA: Diagnosis not present

## 2018-12-29 DIAGNOSIS — M1990 Unspecified osteoarthritis, unspecified site: Secondary | ICD-10-CM | POA: Diagnosis not present

## 2018-12-29 DIAGNOSIS — I779 Disorder of arteries and arterioles, unspecified: Secondary | ICD-10-CM | POA: Diagnosis not present

## 2018-12-29 DIAGNOSIS — N183 Chronic kidney disease, stage 3 (moderate): Secondary | ICD-10-CM | POA: Diagnosis not present

## 2018-12-29 DIAGNOSIS — F411 Generalized anxiety disorder: Secondary | ICD-10-CM | POA: Diagnosis not present

## 2018-12-29 DIAGNOSIS — I129 Hypertensive chronic kidney disease with stage 1 through stage 4 chronic kidney disease, or unspecified chronic kidney disease: Secondary | ICD-10-CM | POA: Diagnosis not present

## 2019-06-04 DIAGNOSIS — Z23 Encounter for immunization: Secondary | ICD-10-CM | POA: Diagnosis not present

## 2019-07-05 DIAGNOSIS — I779 Disorder of arteries and arterioles, unspecified: Secondary | ICD-10-CM | POA: Diagnosis not present

## 2019-07-05 DIAGNOSIS — F411 Generalized anxiety disorder: Secondary | ICD-10-CM | POA: Diagnosis not present

## 2019-07-05 DIAGNOSIS — N3010 Interstitial cystitis (chronic) without hematuria: Secondary | ICD-10-CM | POA: Diagnosis not present

## 2019-07-05 DIAGNOSIS — M1990 Unspecified osteoarthritis, unspecified site: Secondary | ICD-10-CM | POA: Diagnosis not present

## 2019-07-05 DIAGNOSIS — N183 Chronic kidney disease, stage 3 unspecified: Secondary | ICD-10-CM | POA: Diagnosis not present

## 2019-07-05 DIAGNOSIS — Z0000 Encounter for general adult medical examination without abnormal findings: Secondary | ICD-10-CM | POA: Diagnosis not present

## 2019-07-05 DIAGNOSIS — Z1389 Encounter for screening for other disorder: Secondary | ICD-10-CM | POA: Diagnosis not present

## 2019-07-05 DIAGNOSIS — G720 Drug-induced myopathy: Secondary | ICD-10-CM | POA: Diagnosis not present

## 2019-07-05 DIAGNOSIS — M8580 Other specified disorders of bone density and structure, unspecified site: Secondary | ICD-10-CM | POA: Diagnosis not present

## 2019-07-05 DIAGNOSIS — R7301 Impaired fasting glucose: Secondary | ICD-10-CM | POA: Diagnosis not present

## 2019-07-05 DIAGNOSIS — J309 Allergic rhinitis, unspecified: Secondary | ICD-10-CM | POA: Diagnosis not present

## 2019-07-05 DIAGNOSIS — I129 Hypertensive chronic kidney disease with stage 1 through stage 4 chronic kidney disease, or unspecified chronic kidney disease: Secondary | ICD-10-CM | POA: Diagnosis not present

## 2019-10-21 DIAGNOSIS — H25013 Cortical age-related cataract, bilateral: Secondary | ICD-10-CM | POA: Diagnosis not present

## 2019-10-21 DIAGNOSIS — H40051 Ocular hypertension, right eye: Secondary | ICD-10-CM | POA: Diagnosis not present

## 2019-10-21 DIAGNOSIS — H2513 Age-related nuclear cataract, bilateral: Secondary | ICD-10-CM | POA: Diagnosis not present

## 2019-10-21 DIAGNOSIS — H5203 Hypermetropia, bilateral: Secondary | ICD-10-CM | POA: Diagnosis not present

## 2019-11-30 ENCOUNTER — Telehealth: Payer: Self-pay | Admitting: *Deleted

## 2019-11-30 NOTE — Telephone Encounter (Signed)
Left message on voicemail to call and reschedule cancelled appointment. °

## 2019-12-01 ENCOUNTER — Other Ambulatory Visit: Payer: Self-pay | Admitting: Obstetrics & Gynecology

## 2019-12-01 DIAGNOSIS — Z1231 Encounter for screening mammogram for malignant neoplasm of breast: Secondary | ICD-10-CM

## 2019-12-07 ENCOUNTER — Other Ambulatory Visit: Payer: Self-pay

## 2019-12-07 ENCOUNTER — Ambulatory Visit
Admission: RE | Admit: 2019-12-07 | Discharge: 2019-12-07 | Disposition: A | Discharge status: Home / Self Care | Payer: Medicare Other | Source: Ambulatory Visit | Attending: Obstetrics & Gynecology | Admitting: Obstetrics & Gynecology

## 2019-12-07 DIAGNOSIS — Z1231 Encounter for screening mammogram for malignant neoplasm of breast: Secondary | ICD-10-CM

## 2019-12-15 ENCOUNTER — Ambulatory Visit: Payer: Medicare Other | Admitting: Obstetrics & Gynecology

## 2019-12-26 ENCOUNTER — Other Ambulatory Visit: Payer: Self-pay

## 2019-12-27 ENCOUNTER — Ambulatory Visit (INDEPENDENT_AMBULATORY_CARE_PROVIDER_SITE_OTHER): Payer: Medicare Other | Admitting: Obstetrics & Gynecology

## 2019-12-27 ENCOUNTER — Encounter: Payer: Self-pay | Admitting: Obstetrics & Gynecology

## 2019-12-27 VITALS — BP 138/70 | HR 76 | Temp 97.7°F | Ht 58.25 in | Wt 125.0 lb

## 2019-12-27 DIAGNOSIS — Z124 Encounter for screening for malignant neoplasm of cervix: Secondary | ICD-10-CM

## 2019-12-27 DIAGNOSIS — Z01419 Encounter for gynecological examination (general) (routine) without abnormal findings: Secondary | ICD-10-CM

## 2019-12-27 NOTE — Progress Notes (Signed)
79 y.o. G2P2 Widowed White or Caucasian female here for annual exam.  Doing well.  Denies vaginal bleeding.    Saw Dr. Manus Gunning in November.  Did blood work in November with Dr. Manus Gunning.  Reports everything was ok.    Saw ophthalmology in late Feb/early March.  H/o elevated pressure in her left eye.  Has been followed carefully.  Has not been diagnosed with glaucoma.  Reports eye was stable from last year.  Has cataracts.    Needs follow up with vascular.  Did not go for appt last year.  States she will call for this.    Patient's last menstrual period was 08/11/2002.          Sexually active: No.  The current method of family planning is post menopausal status.    Exercising: Yes.    walking Smoker:  no  Health Maintenance: Pap:  05/28/17 neg    11/28/13 neg   05/09/10 neg  History of abnormal Pap:  no MMG:  12/07/19 BIRADS 1 negative/density c Colonoscopy:  11/17/16 [facility], no repeat per patient BMD:   11/03/14 osteopenia.  D/w pt last year.  Did not have done due to Covid pandemic TDaP:  05/08/11 Pneumonia vaccine(s):  completed Shingrix:   completed Hep C testing: n/a Screening Labs: PCP   reports that she has never smoked. She has never used smokeless tobacco. She reports that she does not drink alcohol or use drugs.  Past Medical History:  Diagnosis Date  . Anemia   . Anxiety   . Arthritis    Osteoarthritis  . Arthritis    ? diag in lower back  . Bursitis    left hip-had injection 2015  . Carotid artery occlusion April 2014   Left Bruit, 80% blockage  . CKD (chronic kidney disease)    Stage III  . Diverticulitis   . Hematuria   . Hypertension   . Interstitial cystitis   . Lower back pain June 2015  . Ocular migraine   . Osteopenia   . PONV (postoperative nausea and vomiting)     Past Surgical History:  Procedure Laterality Date  . ENDARTERECTOMY Right 12/22/2012   Procedure: Right Carotid Endarterectomy with hemashield patch angioplasty;  Surgeon: Pryor Ochoa,  MD;  Location: [facility] OR;  Service: Vascular;  Laterality: Right;  . TONSILLECTOMY  1947    Current Outpatient Medications  Medication Sig Dispense Refill  . acetaminophen (TYLENOL) 500 MG tablet Take 500 mg by mouth every 6 (six) hours as needed.    . ALPRAZolam (XANAX) 0.25 MG tablet Take 0.125 mg by mouth 2 (two) times daily as needed for anxiety.     Marland Kitchen aspirin EC 81 MG tablet Take 243 mg by mouth daily. Pt  Takes x's 3  81 mg tabs per day    . azelastine (ASTELIN) 0.1 % nasal spray Place 2 sprays into both nostrils 2 (two) times daily as needed for rhinitis or allergies. As needed  12  . Calcium-Vitamin D (CALTRATE 600 PLUS-VIT D PO) Take 1 tablet by mouth daily at 12 noon.    . Celecoxib (CELEBREX PO) Take 200 mg by mouth daily as needed (pain). Not more than 2 per week    . Cholecalciferol (VITAMIN D) 2000 UNITS CAPS Take 2,000 Units by mouth daily at 12 noon.     . escitalopram (LEXAPRO) 10 MG tablet TK 1 T PO QD  6  . hydrochlorothiazide (HYDRODIURIL) 25 MG tablet Take 25 mg by mouth daily.  3  . moexipril (UNIVASC) 15 MG tablet Take 15 mg by mouth daily.    . Probiotic Product (ALIGN) 4 MG CAPS Take 4 mg by mouth daily.    . Pseudoephedrine HCl (SUDAFED PO) Take 30 mg by mouth as needed (allergies).      No current facility-administered medications for this visit.    Family History  Problem Relation Age of Onset  . COPD Mother        Respiratory Disease  . Rheumatic fever Mother   . Pulmonary disease Mother   . Heart attack Mother   . Stroke Father   . Hypertension Father   . Diabetes Paternal Aunt   . Prostate cancer Maternal Grandfather   . Hypertension Paternal Aunt   . Hypertension Paternal Grandmother   . Other Maternal Grandmother        hardening arteries  . Stroke Other        paternal side    Review of Systems  All other systems reviewed and are negative.   Exam:   BP 138/70 (BP Location: Right Arm, Patient Position: Sitting, Cuff Size: Normal)   Pulse 76    Temp 97.7 F (36.5 C) (Temporal)   Ht 4' 10.25" (1.48 m)   Wt 125 lb (56.7 kg)   LMP 08/11/2002   BMI 25.90 kg/m   Height: 4' 10.25" (148 cm)  General appearance: alert, cooperative and appears stated age Head: Normocephalic, without obvious abnormality, atraumatic Neck: no adenopathy, supple, symmetrical, trachea midline and thyroid normal to inspection and palpation Lungs: clear to auscultation bilaterally Breasts: normal appearance, no masses or tenderness Heart: regular rate and rhythm Abdomen: soft, non-tender; bowel sounds normal; no masses,  no organomegaly Extremities: extremities normal, atraumatic, no cyanosis or edema Skin: Skin color, texture, turgor normal. No rashes or lesions Lymph nodes: Cervical, supraclavicular, and axillary nodes normal. No abnormal inguinal nodes palpated Neurologic: Grossly normal   Pelvic: External genitalia:  no lesions              Urethra:  normal appearing urethra with no masses, tenderness or lesions              Bartholins and Skenes: normal                 Vagina: normal appearing vagina with normal color and discharge, no lesions              Cervix: no lesions              Pap taken: No. Bimanual Exam:  Uterus:  normal size, contour, position, consistency, mobility, non-tender              Adnexa: normal adnexa and no mass, fullness, tenderness               Rectovaginal: Confirms               Anus:  normal sphincter tone, no lesions  Chaperone, Terence Lux, CMA, was present for exam.  A:  Well Woman with normal exam PMP, no HRT Hypertension Carotid plaques, h/o endartectomy  Osteopenia H/o diverticulitis 12/17  P:   Mammogram guidelines reviewed.  Up to date. pap smear not indicated Planning BMD next year with [facility].  Order not placed today but reminder was placed to do order in 09/2020. Lab work done in November with Dr. Marisue Humble Vaccines UTD, has completed Covid vaccination Return annually or prn

## 2020-01-17 DIAGNOSIS — J309 Allergic rhinitis, unspecified: Secondary | ICD-10-CM | POA: Diagnosis not present

## 2020-01-17 DIAGNOSIS — R7301 Impaired fasting glucose: Secondary | ICD-10-CM | POA: Diagnosis not present

## 2020-01-17 DIAGNOSIS — M8580 Other specified disorders of bone density and structure, unspecified site: Secondary | ICD-10-CM | POA: Diagnosis not present

## 2020-01-17 DIAGNOSIS — I779 Disorder of arteries and arterioles, unspecified: Secondary | ICD-10-CM | POA: Diagnosis not present

## 2020-01-17 DIAGNOSIS — M1990 Unspecified osteoarthritis, unspecified site: Secondary | ICD-10-CM | POA: Diagnosis not present

## 2020-01-17 DIAGNOSIS — N183 Chronic kidney disease, stage 3 unspecified: Secondary | ICD-10-CM | POA: Diagnosis not present

## 2020-01-17 DIAGNOSIS — G720 Drug-induced myopathy: Secondary | ICD-10-CM | POA: Diagnosis not present

## 2020-01-17 DIAGNOSIS — F411 Generalized anxiety disorder: Secondary | ICD-10-CM | POA: Diagnosis not present

## 2020-01-17 DIAGNOSIS — N3010 Interstitial cystitis (chronic) without hematuria: Secondary | ICD-10-CM | POA: Diagnosis not present

## 2020-01-17 DIAGNOSIS — I129 Hypertensive chronic kidney disease with stage 1 through stage 4 chronic kidney disease, or unspecified chronic kidney disease: Secondary | ICD-10-CM | POA: Diagnosis not present

## 2020-05-10 DIAGNOSIS — Z23 Encounter for immunization: Secondary | ICD-10-CM | POA: Diagnosis not present

## 2020-05-14 DIAGNOSIS — M5459 Other low back pain: Secondary | ICD-10-CM | POA: Diagnosis not present

## 2020-05-17 DIAGNOSIS — M6281 Muscle weakness (generalized): Secondary | ICD-10-CM | POA: Diagnosis not present

## 2020-05-17 DIAGNOSIS — M48061 Spinal stenosis, lumbar region without neurogenic claudication: Secondary | ICD-10-CM | POA: Diagnosis not present

## 2020-05-17 DIAGNOSIS — M79604 Pain in right leg: Secondary | ICD-10-CM | POA: Diagnosis not present

## 2020-05-17 DIAGNOSIS — M2569 Stiffness of other specified joint, not elsewhere classified: Secondary | ICD-10-CM | POA: Diagnosis not present

## 2020-05-19 DIAGNOSIS — Z23 Encounter for immunization: Secondary | ICD-10-CM | POA: Diagnosis not present

## 2020-05-22 DIAGNOSIS — M48061 Spinal stenosis, lumbar region without neurogenic claudication: Secondary | ICD-10-CM | POA: Diagnosis not present

## 2020-05-22 DIAGNOSIS — M6281 Muscle weakness (generalized): Secondary | ICD-10-CM | POA: Diagnosis not present

## 2020-05-22 DIAGNOSIS — M2569 Stiffness of other specified joint, not elsewhere classified: Secondary | ICD-10-CM | POA: Diagnosis not present

## 2020-05-22 DIAGNOSIS — M79604 Pain in right leg: Secondary | ICD-10-CM | POA: Diagnosis not present

## 2020-05-25 DIAGNOSIS — M2569 Stiffness of other specified joint, not elsewhere classified: Secondary | ICD-10-CM | POA: Diagnosis not present

## 2020-05-25 DIAGNOSIS — M79604 Pain in right leg: Secondary | ICD-10-CM | POA: Diagnosis not present

## 2020-05-25 DIAGNOSIS — M6281 Muscle weakness (generalized): Secondary | ICD-10-CM | POA: Diagnosis not present

## 2020-05-25 DIAGNOSIS — M48061 Spinal stenosis, lumbar region without neurogenic claudication: Secondary | ICD-10-CM | POA: Diagnosis not present

## 2020-05-29 DIAGNOSIS — M2569 Stiffness of other specified joint, not elsewhere classified: Secondary | ICD-10-CM | POA: Diagnosis not present

## 2020-05-29 DIAGNOSIS — M6281 Muscle weakness (generalized): Secondary | ICD-10-CM | POA: Diagnosis not present

## 2020-05-29 DIAGNOSIS — M48061 Spinal stenosis, lumbar region without neurogenic claudication: Secondary | ICD-10-CM | POA: Diagnosis not present

## 2020-05-29 DIAGNOSIS — M79604 Pain in right leg: Secondary | ICD-10-CM | POA: Diagnosis not present

## 2020-06-01 DIAGNOSIS — M6281 Muscle weakness (generalized): Secondary | ICD-10-CM | POA: Diagnosis not present

## 2020-06-01 DIAGNOSIS — M2569 Stiffness of other specified joint, not elsewhere classified: Secondary | ICD-10-CM | POA: Diagnosis not present

## 2020-06-01 DIAGNOSIS — M48061 Spinal stenosis, lumbar region without neurogenic claudication: Secondary | ICD-10-CM | POA: Diagnosis not present

## 2020-06-01 DIAGNOSIS — M79604 Pain in right leg: Secondary | ICD-10-CM | POA: Diagnosis not present

## 2020-06-06 DIAGNOSIS — M2569 Stiffness of other specified joint, not elsewhere classified: Secondary | ICD-10-CM | POA: Diagnosis not present

## 2020-06-06 DIAGNOSIS — M48061 Spinal stenosis, lumbar region without neurogenic claudication: Secondary | ICD-10-CM | POA: Diagnosis not present

## 2020-06-06 DIAGNOSIS — M6281 Muscle weakness (generalized): Secondary | ICD-10-CM | POA: Diagnosis not present

## 2020-06-06 DIAGNOSIS — M79604 Pain in right leg: Secondary | ICD-10-CM | POA: Diagnosis not present

## 2020-06-08 DIAGNOSIS — M2569 Stiffness of other specified joint, not elsewhere classified: Secondary | ICD-10-CM | POA: Diagnosis not present

## 2020-06-08 DIAGNOSIS — M6281 Muscle weakness (generalized): Secondary | ICD-10-CM | POA: Diagnosis not present

## 2020-06-08 DIAGNOSIS — M79604 Pain in right leg: Secondary | ICD-10-CM | POA: Diagnosis not present

## 2020-06-08 DIAGNOSIS — M48061 Spinal stenosis, lumbar region without neurogenic claudication: Secondary | ICD-10-CM | POA: Diagnosis not present

## 2020-06-12 DIAGNOSIS — M79604 Pain in right leg: Secondary | ICD-10-CM | POA: Diagnosis not present

## 2020-06-12 DIAGNOSIS — M48061 Spinal stenosis, lumbar region without neurogenic claudication: Secondary | ICD-10-CM | POA: Diagnosis not present

## 2020-06-12 DIAGNOSIS — M2569 Stiffness of other specified joint, not elsewhere classified: Secondary | ICD-10-CM | POA: Diagnosis not present

## 2020-06-12 DIAGNOSIS — M6281 Muscle weakness (generalized): Secondary | ICD-10-CM | POA: Diagnosis not present

## 2020-06-15 DIAGNOSIS — M6281 Muscle weakness (generalized): Secondary | ICD-10-CM | POA: Diagnosis not present

## 2020-06-15 DIAGNOSIS — M48061 Spinal stenosis, lumbar region without neurogenic claudication: Secondary | ICD-10-CM | POA: Diagnosis not present

## 2020-06-15 DIAGNOSIS — M79604 Pain in right leg: Secondary | ICD-10-CM | POA: Diagnosis not present

## 2020-06-15 DIAGNOSIS — M2569 Stiffness of other specified joint, not elsewhere classified: Secondary | ICD-10-CM | POA: Diagnosis not present

## 2020-06-19 DIAGNOSIS — M2569 Stiffness of other specified joint, not elsewhere classified: Secondary | ICD-10-CM | POA: Diagnosis not present

## 2020-06-19 DIAGNOSIS — M79604 Pain in right leg: Secondary | ICD-10-CM | POA: Diagnosis not present

## 2020-06-19 DIAGNOSIS — M6281 Muscle weakness (generalized): Secondary | ICD-10-CM | POA: Diagnosis not present

## 2020-06-19 DIAGNOSIS — M48061 Spinal stenosis, lumbar region without neurogenic claudication: Secondary | ICD-10-CM | POA: Diagnosis not present

## 2020-06-21 DIAGNOSIS — M2569 Stiffness of other specified joint, not elsewhere classified: Secondary | ICD-10-CM | POA: Diagnosis not present

## 2020-06-21 DIAGNOSIS — M6281 Muscle weakness (generalized): Secondary | ICD-10-CM | POA: Diagnosis not present

## 2020-06-21 DIAGNOSIS — M79604 Pain in right leg: Secondary | ICD-10-CM | POA: Diagnosis not present

## 2020-06-21 DIAGNOSIS — M48061 Spinal stenosis, lumbar region without neurogenic claudication: Secondary | ICD-10-CM | POA: Diagnosis not present

## 2020-06-26 DIAGNOSIS — M79604 Pain in right leg: Secondary | ICD-10-CM | POA: Diagnosis not present

## 2020-06-26 DIAGNOSIS — M48061 Spinal stenosis, lumbar region without neurogenic claudication: Secondary | ICD-10-CM | POA: Diagnosis not present

## 2020-06-26 DIAGNOSIS — M6281 Muscle weakness (generalized): Secondary | ICD-10-CM | POA: Diagnosis not present

## 2020-06-26 DIAGNOSIS — M2569 Stiffness of other specified joint, not elsewhere classified: Secondary | ICD-10-CM | POA: Diagnosis not present

## 2020-06-27 ENCOUNTER — Other Ambulatory Visit: Payer: Self-pay | Admitting: Orthopedic Surgery

## 2020-06-27 DIAGNOSIS — M545 Low back pain, unspecified: Secondary | ICD-10-CM

## 2020-06-29 DIAGNOSIS — M79604 Pain in right leg: Secondary | ICD-10-CM | POA: Diagnosis not present

## 2020-06-29 DIAGNOSIS — M48061 Spinal stenosis, lumbar region without neurogenic claudication: Secondary | ICD-10-CM | POA: Diagnosis not present

## 2020-06-29 DIAGNOSIS — M2569 Stiffness of other specified joint, not elsewhere classified: Secondary | ICD-10-CM | POA: Diagnosis not present

## 2020-06-29 DIAGNOSIS — M6281 Muscle weakness (generalized): Secondary | ICD-10-CM | POA: Diagnosis not present

## 2020-07-03 DIAGNOSIS — M6281 Muscle weakness (generalized): Secondary | ICD-10-CM | POA: Diagnosis not present

## 2020-07-03 DIAGNOSIS — M48061 Spinal stenosis, lumbar region without neurogenic claudication: Secondary | ICD-10-CM | POA: Diagnosis not present

## 2020-07-03 DIAGNOSIS — M79604 Pain in right leg: Secondary | ICD-10-CM | POA: Diagnosis not present

## 2020-07-12 DIAGNOSIS — R7301 Impaired fasting glucose: Secondary | ICD-10-CM | POA: Diagnosis not present

## 2020-07-12 DIAGNOSIS — R0609 Other forms of dyspnea: Secondary | ICD-10-CM | POA: Diagnosis not present

## 2020-07-12 DIAGNOSIS — F411 Generalized anxiety disorder: Secondary | ICD-10-CM | POA: Diagnosis not present

## 2020-07-12 DIAGNOSIS — Z0000 Encounter for general adult medical examination without abnormal findings: Secondary | ICD-10-CM | POA: Diagnosis not present

## 2020-07-12 DIAGNOSIS — G720 Drug-induced myopathy: Secondary | ICD-10-CM | POA: Diagnosis not present

## 2020-07-12 DIAGNOSIS — I129 Hypertensive chronic kidney disease with stage 1 through stage 4 chronic kidney disease, or unspecified chronic kidney disease: Secondary | ICD-10-CM | POA: Diagnosis not present

## 2020-07-12 DIAGNOSIS — Z1389 Encounter for screening for other disorder: Secondary | ICD-10-CM | POA: Diagnosis not present

## 2020-07-12 DIAGNOSIS — M8580 Other specified disorders of bone density and structure, unspecified site: Secondary | ICD-10-CM | POA: Diagnosis not present

## 2020-07-12 DIAGNOSIS — J309 Allergic rhinitis, unspecified: Secondary | ICD-10-CM | POA: Diagnosis not present

## 2020-07-12 DIAGNOSIS — H9190 Unspecified hearing loss, unspecified ear: Secondary | ICD-10-CM | POA: Diagnosis not present

## 2020-07-12 DIAGNOSIS — I779 Disorder of arteries and arterioles, unspecified: Secondary | ICD-10-CM | POA: Diagnosis not present

## 2020-07-12 DIAGNOSIS — M1990 Unspecified osteoarthritis, unspecified site: Secondary | ICD-10-CM | POA: Diagnosis not present

## 2020-07-17 ENCOUNTER — Other Ambulatory Visit: Payer: Self-pay

## 2020-07-17 ENCOUNTER — Ambulatory Visit
Admission: RE | Admit: 2020-07-17 | Discharge: 2020-07-17 | Disposition: A | Discharge status: Home / Self Care | Payer: Medicare Other | Source: Ambulatory Visit | Attending: Orthopedic Surgery | Admitting: Orthopedic Surgery

## 2020-07-17 DIAGNOSIS — M48061 Spinal stenosis, lumbar region without neurogenic claudication: Secondary | ICD-10-CM | POA: Diagnosis not present

## 2020-07-17 DIAGNOSIS — M545 Low back pain, unspecified: Secondary | ICD-10-CM

## 2020-07-19 DIAGNOSIS — M48061 Spinal stenosis, lumbar region without neurogenic claudication: Secondary | ICD-10-CM | POA: Diagnosis not present

## 2020-07-19 DIAGNOSIS — M4316 Spondylolisthesis, lumbar region: Secondary | ICD-10-CM | POA: Diagnosis not present

## 2020-08-09 DIAGNOSIS — M4316 Spondylolisthesis, lumbar region: Secondary | ICD-10-CM | POA: Diagnosis not present

## 2020-08-09 DIAGNOSIS — M48061 Spinal stenosis, lumbar region without neurogenic claudication: Secondary | ICD-10-CM | POA: Diagnosis not present

## 2020-08-09 DIAGNOSIS — M5416 Radiculopathy, lumbar region: Secondary | ICD-10-CM | POA: Diagnosis not present

## 2020-08-22 DIAGNOSIS — H903 Sensorineural hearing loss, bilateral: Secondary | ICD-10-CM | POA: Diagnosis not present

## 2020-08-28 DIAGNOSIS — M48061 Spinal stenosis, lumbar region without neurogenic claudication: Secondary | ICD-10-CM | POA: Diagnosis not present

## 2020-08-28 DIAGNOSIS — M5416 Radiculopathy, lumbar region: Secondary | ICD-10-CM | POA: Diagnosis not present

## 2020-08-28 DIAGNOSIS — M4316 Spondylolisthesis, lumbar region: Secondary | ICD-10-CM | POA: Diagnosis not present

## 2020-09-05 DIAGNOSIS — M5416 Radiculopathy, lumbar region: Secondary | ICD-10-CM | POA: Diagnosis not present

## 2020-09-27 DIAGNOSIS — M5416 Radiculopathy, lumbar region: Secondary | ICD-10-CM | POA: Diagnosis not present

## 2020-09-27 DIAGNOSIS — M542 Cervicalgia: Secondary | ICD-10-CM | POA: Diagnosis not present

## 2020-09-27 DIAGNOSIS — M545 Low back pain, unspecified: Secondary | ICD-10-CM | POA: Diagnosis not present

## 2020-09-27 DIAGNOSIS — M4316 Spondylolisthesis, lumbar region: Secondary | ICD-10-CM | POA: Diagnosis not present

## 2020-10-15 DIAGNOSIS — R7301 Impaired fasting glucose: Secondary | ICD-10-CM | POA: Diagnosis not present

## 2020-10-25 DIAGNOSIS — H5203 Hypermetropia, bilateral: Secondary | ICD-10-CM | POA: Diagnosis not present

## 2020-10-25 DIAGNOSIS — H25013 Cortical age-related cataract, bilateral: Secondary | ICD-10-CM | POA: Diagnosis not present

## 2020-10-25 DIAGNOSIS — H2513 Age-related nuclear cataract, bilateral: Secondary | ICD-10-CM | POA: Diagnosis not present

## 2020-10-25 DIAGNOSIS — H40051 Ocular hypertension, right eye: Secondary | ICD-10-CM | POA: Diagnosis not present

## 2020-12-20 DIAGNOSIS — N183 Chronic kidney disease, stage 3 unspecified: Secondary | ICD-10-CM | POA: Diagnosis not present

## 2020-12-20 DIAGNOSIS — I129 Hypertensive chronic kidney disease with stage 1 through stage 4 chronic kidney disease, or unspecified chronic kidney disease: Secondary | ICD-10-CM | POA: Diagnosis not present

## 2020-12-20 DIAGNOSIS — M8580 Other specified disorders of bone density and structure, unspecified site: Secondary | ICD-10-CM | POA: Diagnosis not present

## 2020-12-20 DIAGNOSIS — M1990 Unspecified osteoarthritis, unspecified site: Secondary | ICD-10-CM | POA: Diagnosis not present

## 2021-01-09 DIAGNOSIS — I129 Hypertensive chronic kidney disease with stage 1 through stage 4 chronic kidney disease, or unspecified chronic kidney disease: Secondary | ICD-10-CM | POA: Diagnosis not present

## 2021-01-09 DIAGNOSIS — F411 Generalized anxiety disorder: Secondary | ICD-10-CM | POA: Diagnosis not present

## 2021-01-09 DIAGNOSIS — M1990 Unspecified osteoarthritis, unspecified site: Secondary | ICD-10-CM | POA: Diagnosis not present

## 2021-01-09 DIAGNOSIS — N183 Chronic kidney disease, stage 3 unspecified: Secondary | ICD-10-CM | POA: Diagnosis not present

## 2021-01-09 DIAGNOSIS — N3010 Interstitial cystitis (chronic) without hematuria: Secondary | ICD-10-CM | POA: Diagnosis not present

## 2021-01-09 DIAGNOSIS — M8580 Other specified disorders of bone density and structure, unspecified site: Secondary | ICD-10-CM | POA: Diagnosis not present

## 2021-01-09 DIAGNOSIS — I779 Disorder of arteries and arterioles, unspecified: Secondary | ICD-10-CM | POA: Diagnosis not present

## 2021-01-09 DIAGNOSIS — R7301 Impaired fasting glucose: Secondary | ICD-10-CM | POA: Diagnosis not present

## 2021-01-09 DIAGNOSIS — J309 Allergic rhinitis, unspecified: Secondary | ICD-10-CM | POA: Diagnosis not present

## 2021-01-09 DIAGNOSIS — G720 Drug-induced myopathy: Secondary | ICD-10-CM | POA: Diagnosis not present

## 2021-01-28 DIAGNOSIS — N183 Chronic kidney disease, stage 3 unspecified: Secondary | ICD-10-CM | POA: Diagnosis not present

## 2021-01-28 DIAGNOSIS — I129 Hypertensive chronic kidney disease with stage 1 through stage 4 chronic kidney disease, or unspecified chronic kidney disease: Secondary | ICD-10-CM | POA: Diagnosis not present

## 2021-01-28 DIAGNOSIS — M1990 Unspecified osteoarthritis, unspecified site: Secondary | ICD-10-CM | POA: Diagnosis not present

## 2021-01-28 DIAGNOSIS — M8580 Other specified disorders of bone density and structure, unspecified site: Secondary | ICD-10-CM | POA: Diagnosis not present

## 2021-02-28 ENCOUNTER — Ambulatory Visit: Payer: Medicare Other

## 2021-04-09 ENCOUNTER — Emergency Department (HOSPITAL_BASED_OUTPATIENT_CLINIC_OR_DEPARTMENT_OTHER)
Admission: EM | Admit: 2021-04-09 | Discharge: 2021-04-09 | Disposition: A | Discharge status: Home / Self Care | Payer: Medicare Other | Attending: Emergency Medicine | Admitting: Emergency Medicine

## 2021-04-09 ENCOUNTER — Emergency Department (HOSPITAL_BASED_OUTPATIENT_CLINIC_OR_DEPARTMENT_OTHER): Payer: Medicare Other

## 2021-04-09 ENCOUNTER — Encounter (HOSPITAL_BASED_OUTPATIENT_CLINIC_OR_DEPARTMENT_OTHER): Payer: Self-pay | Admitting: *Deleted

## 2021-04-09 ENCOUNTER — Other Ambulatory Visit: Payer: Self-pay

## 2021-04-09 DIAGNOSIS — Z79899 Other long term (current) drug therapy: Secondary | ICD-10-CM | POA: Insufficient documentation

## 2021-04-09 DIAGNOSIS — S72102A Unspecified trochanteric fracture of left femur, initial encounter for closed fracture: Secondary | ICD-10-CM

## 2021-04-09 DIAGNOSIS — K5730 Diverticulosis of large intestine without perforation or abscess without bleeding: Secondary | ICD-10-CM | POA: Diagnosis not present

## 2021-04-09 DIAGNOSIS — W01198A Fall on same level from slipping, tripping and stumbling with subsequent striking against other object, initial encounter: Secondary | ICD-10-CM | POA: Diagnosis not present

## 2021-04-09 DIAGNOSIS — I129 Hypertensive chronic kidney disease with stage 1 through stage 4 chronic kidney disease, or unspecified chronic kidney disease: Secondary | ICD-10-CM | POA: Insufficient documentation

## 2021-04-09 DIAGNOSIS — M79645 Pain in left finger(s): Secondary | ICD-10-CM | POA: Diagnosis not present

## 2021-04-09 DIAGNOSIS — Z7982 Long term (current) use of aspirin: Secondary | ICD-10-CM | POA: Insufficient documentation

## 2021-04-09 DIAGNOSIS — N183 Chronic kidney disease, stage 3 unspecified: Secondary | ICD-10-CM | POA: Insufficient documentation

## 2021-04-09 DIAGNOSIS — W19XXXA Unspecified fall, initial encounter: Secondary | ICD-10-CM

## 2021-04-09 DIAGNOSIS — S72112A Displaced fracture of greater trochanter of left femur, initial encounter for closed fracture: Secondary | ICD-10-CM | POA: Insufficient documentation

## 2021-04-09 DIAGNOSIS — M25552 Pain in left hip: Secondary | ICD-10-CM | POA: Diagnosis not present

## 2021-04-09 DIAGNOSIS — M79605 Pain in left leg: Secondary | ICD-10-CM | POA: Diagnosis not present

## 2021-04-09 DIAGNOSIS — S62665A Nondisplaced fracture of distal phalanx of left ring finger, initial encounter for closed fracture: Secondary | ICD-10-CM | POA: Diagnosis not present

## 2021-04-09 DIAGNOSIS — S79912A Unspecified injury of left hip, initial encounter: Secondary | ICD-10-CM | POA: Diagnosis present

## 2021-04-09 MED ORDER — OXYCODONE-ACETAMINOPHEN 5-325 MG PO TABS
1.0000 | ORAL_TABLET | Freq: Once | ORAL | Status: AC
  Administered 2021-04-09: 1 via ORAL
  Filled 2021-04-09: qty 1

## 2021-04-09 MED ORDER — OXYCODONE-ACETAMINOPHEN 5-325 MG PO TABS: 1.0000 | ORAL_TABLET | Freq: Four times a day (QID) | ORAL | 0 refills | Status: DC | PRN

## 2021-04-09 NOTE — Discharge Instructions (Addendum)
You were seen in the emergency department for evaluation of left hip pain after a fall.  You had a CAT scan that showed you had a fracture of the greater trochanter of the left hip.  Dr. Eulah Pont from orthopedics recommends walker, toe-touch weightbearing, and follow-up in the office later this week.  You can use Tylenol for pain or the oxycodone for severe pain.  Return to the emergency department if any worsening or concerning symptoms

## 2021-04-09 NOTE — ED Provider Notes (Signed)
[facility] EMERGENCY DEPARTMENT Provider Note   CSN: [id number] Arrival date & time: 04/09/21  2044     History Chief Complaint  Patient presents with   Amber Barry is a 80 y.o. female.  She is here for evaluation of left hip pain after fall earlier today.  She said she landed mostly on her back but little to the left side.  She is having pain with ambulation.  No loss consciousness no head or neck pain.  She is not on blood thinners.  No numbness or weakness.  She went to urgent care and they were suspicious for fracture and sent her here.  The history is provided by the patient.  Fall This is a new problem. The current episode started 3 to 5 hours ago. The problem has not changed since onset.Pertinent negatives include no chest pain, no abdominal pain, no headaches and no shortness of breath. Associated symptoms comments: L hip pain. The symptoms are aggravated by bending and walking. Nothing relieves the symptoms. She has tried nothing for the symptoms. The treatment provided no relief.      Past Medical History:  Diagnosis Date   Anemia    Anxiety    Arthritis    Osteoarthritis   Bursitis    left hip-had injection 2015   Carotid artery occlusion April 2014   Left Bruit, 80% blockage   CKD (chronic kidney disease)    Stage III   Diverticulitis    Hematuria    Hypertension    Interstitial cystitis    Lower back pain June 2015   Ocular migraine    Osteopenia    PONV (postoperative nausea and vomiting)     Patient Active Problem List   Diagnosis Date Noted   Hypertension 08/12/2018   Osteopenia 08/12/2018   Unspecified constipation 12/05/2013   Occlusion and stenosis of carotid artery without mention of cerebral infarction 11/30/2012    Past Surgical History:  Procedure Laterality Date   ENDARTERECTOMY Right 12/22/2012   Procedure: Right Carotid Endarterectomy with hemashield patch angioplasty;  Surgeon: Pryor Ochoa, MD;  Location:  [facility] OR;  Service: Vascular;  Laterality: Right;   TONSILLECTOMY  1947     OB History     Gravida  2   Para  2   Term      Preterm      AB      Living  2      SAB      IAB      Ectopic      Multiple      Live Births              Family History  Problem Relation Age of Onset   COPD Mother        Respiratory Disease   Rheumatic fever Mother    Pulmonary disease Mother    Heart attack Mother    Stroke Father    Hypertension Father    Diabetes Paternal Aunt    Prostate cancer Maternal Grandfather    Hypertension Paternal Aunt    Hypertension Paternal Grandmother    Other Maternal Grandmother        hardening arteries   Stroke Other        paternal side    Social History   Tobacco Use   Smoking status: Never   Smokeless tobacco: Never  Vaping Use   Vaping Use: Never used  Substance Use Topics   Alcohol  use: No   Drug use: No    Home Medications Prior to Admission medications   Medication Sig Start Date End Date Taking? Authorizing Provider  acetaminophen (TYLENOL) 500 MG tablet Take 500 mg by mouth every 6 (six) hours as needed.    [provider]  ALPRAZolam Prudy Feeler(XANAX) 0.25 MG tablet Take 0.125 mg by mouth 2 (two) times daily as needed for anxiety.     [provider]  aspirin EC 81 MG tablet Take 243 mg by mouth daily. Pt  Takes x's 3  81 mg tabs per day    [provider]  azelastine (ASTELIN) 0.1 % nasal spray Place 2 sprays into both nostrils 2 (two) times daily as needed for rhinitis or allergies. As needed 11/15/14   [provider]  Calcium-Vitamin D (CALTRATE 600 PLUS-VIT D PO) Take 1 tablet by mouth daily at 12 noon.    [provider]  Celecoxib (CELEBREX PO) Take 200 mg by mouth daily as needed (pain). Not more than 2 per week    [provider]  Cholecalciferol (VITAMIN D) 2000 UNITS CAPS Take 2,000 Units by mouth daily at 12 noon.     [provider]  escitalopram (LEXAPRO) 10  MG tablet TK 1 T PO QD 03/25/17   [provider]  hydrochlorothiazide (HYDRODIURIL) 25 MG tablet Take 25 mg by mouth daily.  11/15/14   [provider]  moexipril (UNIVASC) 15 MG tablet Take 15 mg by mouth daily.    [provider]  Probiotic Product (ALIGN) 4 MG CAPS Take 4 mg by mouth daily.    [provider]  Pseudoephedrine HCl (SUDAFED PO) Take 30 mg by mouth as needed (allergies).     [provider]    Allergies    Benicar hct [olmesartan medoxomil-hctz], Buspar [buspirone], Norvasc [amlodipine besylate], Zoloft [sertraline hcl], Lipitor [atorvastatin], Lisinopril, Motrin [ibuprofen], Amitriptyline hcl, and Pravastatin  Review of Systems   Review of Systems  Constitutional:  Negative for fever.  HENT:  Negative for sore throat.   Eyes:  Negative for visual disturbance.  Respiratory:  Negative for shortness of breath.   Cardiovascular:  Negative for chest pain.  Gastrointestinal:  Negative for abdominal pain.  Genitourinary:  Negative for dysuria.  Musculoskeletal:  Positive for gait problem. Negative for back pain.  Skin:  Negative for rash.  Neurological:  Negative for weakness, numbness and headaches.   Physical Exam Updated Vital Signs BP (!) 181/71   Pulse 83   Temp 98.3 F (36.8 C) (Oral)   Resp 16   Ht 4\' 10"  (1.473 m)   Wt 54.4 kg   LMP 08/11/2002   SpO2 99%   BMI 25.08 kg/m   Physical Exam Vitals and nursing note reviewed.  Constitutional:      General: She is not in acute distress.    Appearance: Normal appearance. She is well-developed.  HENT:     Head: Normocephalic and atraumatic.  Eyes:     Conjunctiva/sclera: Conjunctivae normal.  Cardiovascular:     Rate and Rhythm: Normal rate and regular rhythm.     Heart sounds: No murmur heard. Pulmonary:     Effort: Pulmonary effort is normal. No respiratory distress.     Breath sounds: Normal breath sounds.  Abdominal:     Palpations: Abdomen is soft.      Tenderness: There is no abdominal tenderness.  Musculoskeletal:        General: Tenderness present. Normal range of motion.  Cervical back: Neck supple.     Comments: She is tender on her left lateral hip.  Distal neurovascular intact.  Knee ankle nontender.  Per left fourth finger is also wrapped up.  She said they x-rayed at the urgent care and told her there was a fracture underneath the nail.    Skin:    General: Skin is warm and dry.  Neurological:     General: No focal deficit present.     Mental Status: She is alert.    ED Results / Procedures / Treatments   Labs (all labs ordered are listed, but only abnormal results are displayed) Labs Reviewed - No data to display  EKG None  Radiology CT PELVIS WO CONTRAST  Result Date: 04/09/2021 CLINICAL DATA:  Status post fall.  Pelvic fracture per urgent care. EXAM: CT PELVIS WITHOUT CONTRAST TECHNIQUE: Multidetector CT imaging of the pelvis was performed following the standard protocol without intravenous contrast. COMPARISON:  None. FINDINGS: Urinary Tract:  No abnormality visualized. Bowel: Diffuse sigmoid diverticulosis. Otherwise unremarkable visualized pelvic bowel loops. Vascular/Lymphatic: Atherosclerotic plaque. No pathologically enlarged lymph nodes. No significant vascular abnormality seen. Reproductive: Calcified lesions within the uterus likely degenerative uterine fibroids. Otherwise the uterus and bilateral adnexal regions are unremarkable. Other: No intraperitoneal free fluid. No intraperitoneal free gas. No organized fluid collection. Musculoskeletal: Comminuted left greater trochanteric fracture (6:37-40). No acute displaced fracture of the right hip. No acute displaced fracture of the pelvis. No pelvic diastasis. No hip dislocation bilaterally. IMPRESSION: 1. Comminuted left greater trochanteric fracture. 2. Other nonacute findings as above. 3.  Aortic Atherosclerosis (ICD10-I70.0). Electronically Signed   By: Tish Frederickson M.D.   On: 04/09/2021 21:42    Procedures Procedures   Medications Ordered in ED Medications  oxyCODONE-acetaminophen (PERCOCET/ROXICET) 5-325 MG per tablet 1 tablet (1 tablet Oral Given 04/09/21 2305)    ED Course  I have reviewed the triage vital signs and the nursing notes.  Pertinent labs & imaging results that were available during my care of the patient were reviewed by me and considered in my medical decision making (see chart for details).  Clinical Course as of 04/10/21 1121  Tue Apr 09, 2021  2248 Discussed with Dr. Eulah Pont orthopedics.  He recommends walker toe-touch weightbearing and follow-up in the office this week or early next week. [MB]    Clinical Course User Index [MB] Terrilee Files, MD   MDM Rules/Calculators/A&P                           Pleasant 80 year old female here for evaluation of injuries after mechanical fall at home.  She has some tenderness to her left lateral hip.  Does have full range of motion.  Knee and ankle nontender and distal pulses preserved.  Outpatient imaging at outside facility inconclusive.  CT here ordered and interpreted by me as acute greater trochanteric fracture.  Reviewed with consult orthopedist Dr. Eulah Pont who recommends toe-touch weightbearing and walker.  Follow-up in office this week.  Reviewed with patient and daughter.  They are comfortable plan for outpatient management of this.  Differential diagnosis includes fracture, contusion, dislocation Final Clinical Impression(s) / ED Diagnoses Final diagnoses:  Fall, initial encounter  Closed fracture of trochanter of left femur, initial encounter (HCC)    Rx / DC Orders ED Discharge Orders          Ordered    oxyCODONE-acetaminophen (PERCOCET/ROXICET) 5-325 MG tablet  Every 6 hours PRN  04/09/21 2259             Terrilee Files, MD 04/10/21 1124

## 2021-04-09 NOTE — ED Notes (Signed)
Wheeled walker

## 2021-04-09 NOTE — ED Triage Notes (Signed)
C/o fall from standing landing on left hip on hardwood floor. Sent here from [facility] for ? Left hip fx

## 2021-04-10 DIAGNOSIS — M79642 Pain in left hand: Secondary | ICD-10-CM | POA: Diagnosis not present

## 2021-04-22 DIAGNOSIS — M25552 Pain in left hip: Secondary | ICD-10-CM | POA: Diagnosis not present

## 2021-04-22 DIAGNOSIS — M79645 Pain in left finger(s): Secondary | ICD-10-CM | POA: Diagnosis not present

## 2021-05-06 DIAGNOSIS — M79645 Pain in left finger(s): Secondary | ICD-10-CM | POA: Diagnosis not present

## 2021-05-06 DIAGNOSIS — M25552 Pain in left hip: Secondary | ICD-10-CM | POA: Diagnosis not present

## 2021-05-20 DIAGNOSIS — M25552 Pain in left hip: Secondary | ICD-10-CM | POA: Diagnosis not present

## 2021-05-20 DIAGNOSIS — M79645 Pain in left finger(s): Secondary | ICD-10-CM | POA: Diagnosis not present

## 2021-05-22 DIAGNOSIS — M6281 Muscle weakness (generalized): Secondary | ICD-10-CM | POA: Diagnosis not present

## 2021-05-22 DIAGNOSIS — M25552 Pain in left hip: Secondary | ICD-10-CM | POA: Diagnosis not present

## 2021-05-22 DIAGNOSIS — S72115D Nondisplaced fracture of greater trochanter of left femur, subsequent encounter for closed fracture with routine healing: Secondary | ICD-10-CM | POA: Diagnosis not present

## 2021-05-22 DIAGNOSIS — R262 Difficulty in walking, not elsewhere classified: Secondary | ICD-10-CM | POA: Diagnosis not present

## 2021-05-25 DIAGNOSIS — Z23 Encounter for immunization: Secondary | ICD-10-CM | POA: Diagnosis not present

## 2021-05-27 DIAGNOSIS — S72115D Nondisplaced fracture of greater trochanter of left femur, subsequent encounter for closed fracture with routine healing: Secondary | ICD-10-CM | POA: Diagnosis not present

## 2021-05-27 DIAGNOSIS — M25552 Pain in left hip: Secondary | ICD-10-CM | POA: Diagnosis not present

## 2021-05-27 DIAGNOSIS — M6281 Muscle weakness (generalized): Secondary | ICD-10-CM | POA: Diagnosis not present

## 2021-05-27 DIAGNOSIS — R262 Difficulty in walking, not elsewhere classified: Secondary | ICD-10-CM | POA: Diagnosis not present

## 2021-05-30 DIAGNOSIS — R262 Difficulty in walking, not elsewhere classified: Secondary | ICD-10-CM | POA: Diagnosis not present

## 2021-05-30 DIAGNOSIS — M6281 Muscle weakness (generalized): Secondary | ICD-10-CM | POA: Diagnosis not present

## 2021-05-30 DIAGNOSIS — S72115D Nondisplaced fracture of greater trochanter of left femur, subsequent encounter for closed fracture with routine healing: Secondary | ICD-10-CM | POA: Diagnosis not present

## 2021-05-30 DIAGNOSIS — M25552 Pain in left hip: Secondary | ICD-10-CM | POA: Diagnosis not present

## 2021-06-03 DIAGNOSIS — M25511 Pain in right shoulder: Secondary | ICD-10-CM | POA: Diagnosis not present

## 2021-06-03 DIAGNOSIS — R262 Difficulty in walking, not elsewhere classified: Secondary | ICD-10-CM | POA: Diagnosis not present

## 2021-06-03 DIAGNOSIS — M6281 Muscle weakness (generalized): Secondary | ICD-10-CM | POA: Diagnosis not present

## 2021-06-03 DIAGNOSIS — S72115D Nondisplaced fracture of greater trochanter of left femur, subsequent encounter for closed fracture with routine healing: Secondary | ICD-10-CM | POA: Diagnosis not present

## 2021-06-06 DIAGNOSIS — M6281 Muscle weakness (generalized): Secondary | ICD-10-CM | POA: Diagnosis not present

## 2021-06-06 DIAGNOSIS — R262 Difficulty in walking, not elsewhere classified: Secondary | ICD-10-CM | POA: Diagnosis not present

## 2021-06-06 DIAGNOSIS — S72115D Nondisplaced fracture of greater trochanter of left femur, subsequent encounter for closed fracture with routine healing: Secondary | ICD-10-CM | POA: Diagnosis not present

## 2021-06-06 DIAGNOSIS — M25552 Pain in left hip: Secondary | ICD-10-CM | POA: Diagnosis not present

## 2021-06-10 DIAGNOSIS — S72115D Nondisplaced fracture of greater trochanter of left femur, subsequent encounter for closed fracture with routine healing: Secondary | ICD-10-CM | POA: Diagnosis not present

## 2021-06-10 DIAGNOSIS — M6281 Muscle weakness (generalized): Secondary | ICD-10-CM | POA: Diagnosis not present

## 2021-06-10 DIAGNOSIS — M25552 Pain in left hip: Secondary | ICD-10-CM | POA: Diagnosis not present

## 2021-06-10 DIAGNOSIS — R262 Difficulty in walking, not elsewhere classified: Secondary | ICD-10-CM | POA: Diagnosis not present

## 2021-06-13 DIAGNOSIS — M6281 Muscle weakness (generalized): Secondary | ICD-10-CM | POA: Diagnosis not present

## 2021-06-13 DIAGNOSIS — M25552 Pain in left hip: Secondary | ICD-10-CM | POA: Diagnosis not present

## 2021-06-13 DIAGNOSIS — S72115D Nondisplaced fracture of greater trochanter of left femur, subsequent encounter for closed fracture with routine healing: Secondary | ICD-10-CM | POA: Diagnosis not present

## 2021-06-13 DIAGNOSIS — R262 Difficulty in walking, not elsewhere classified: Secondary | ICD-10-CM | POA: Diagnosis not present

## 2021-06-17 DIAGNOSIS — M6281 Muscle weakness (generalized): Secondary | ICD-10-CM | POA: Diagnosis not present

## 2021-06-17 DIAGNOSIS — S72115D Nondisplaced fracture of greater trochanter of left femur, subsequent encounter for closed fracture with routine healing: Secondary | ICD-10-CM | POA: Diagnosis not present

## 2021-06-17 DIAGNOSIS — R262 Difficulty in walking, not elsewhere classified: Secondary | ICD-10-CM | POA: Diagnosis not present

## 2021-06-17 DIAGNOSIS — M25552 Pain in left hip: Secondary | ICD-10-CM | POA: Diagnosis not present

## 2021-06-20 DIAGNOSIS — M25552 Pain in left hip: Secondary | ICD-10-CM | POA: Diagnosis not present

## 2021-06-20 DIAGNOSIS — R262 Difficulty in walking, not elsewhere classified: Secondary | ICD-10-CM | POA: Diagnosis not present

## 2021-06-20 DIAGNOSIS — M6281 Muscle weakness (generalized): Secondary | ICD-10-CM | POA: Diagnosis not present

## 2021-06-20 DIAGNOSIS — S72115D Nondisplaced fracture of greater trochanter of left femur, subsequent encounter for closed fracture with routine healing: Secondary | ICD-10-CM | POA: Diagnosis not present

## 2021-06-24 DIAGNOSIS — M79645 Pain in left finger(s): Secondary | ICD-10-CM | POA: Diagnosis not present

## 2021-06-24 DIAGNOSIS — M25552 Pain in left hip: Secondary | ICD-10-CM | POA: Diagnosis not present

## 2021-06-27 DIAGNOSIS — R262 Difficulty in walking, not elsewhere classified: Secondary | ICD-10-CM | POA: Diagnosis not present

## 2021-06-27 DIAGNOSIS — M25552 Pain in left hip: Secondary | ICD-10-CM | POA: Diagnosis not present

## 2021-06-27 DIAGNOSIS — S72115D Nondisplaced fracture of greater trochanter of left femur, subsequent encounter for closed fracture with routine healing: Secondary | ICD-10-CM | POA: Diagnosis not present

## 2021-06-27 DIAGNOSIS — M6281 Muscle weakness (generalized): Secondary | ICD-10-CM | POA: Diagnosis not present

## 2021-07-02 DIAGNOSIS — M6281 Muscle weakness (generalized): Secondary | ICD-10-CM | POA: Diagnosis not present

## 2021-07-02 DIAGNOSIS — S72115D Nondisplaced fracture of greater trochanter of left femur, subsequent encounter for closed fracture with routine healing: Secondary | ICD-10-CM | POA: Diagnosis not present

## 2021-07-02 DIAGNOSIS — M25552 Pain in left hip: Secondary | ICD-10-CM | POA: Diagnosis not present

## 2021-07-02 DIAGNOSIS — R262 Difficulty in walking, not elsewhere classified: Secondary | ICD-10-CM | POA: Diagnosis not present

## 2021-07-08 DIAGNOSIS — R262 Difficulty in walking, not elsewhere classified: Secondary | ICD-10-CM | POA: Diagnosis not present

## 2021-07-08 DIAGNOSIS — M6281 Muscle weakness (generalized): Secondary | ICD-10-CM | POA: Diagnosis not present

## 2021-07-08 DIAGNOSIS — S72115D Nondisplaced fracture of greater trochanter of left femur, subsequent encounter for closed fracture with routine healing: Secondary | ICD-10-CM | POA: Diagnosis not present

## 2021-07-08 DIAGNOSIS — M25552 Pain in left hip: Secondary | ICD-10-CM | POA: Diagnosis not present

## 2021-07-11 DIAGNOSIS — R262 Difficulty in walking, not elsewhere classified: Secondary | ICD-10-CM | POA: Diagnosis not present

## 2021-07-11 DIAGNOSIS — M6281 Muscle weakness (generalized): Secondary | ICD-10-CM | POA: Diagnosis not present

## 2021-07-11 DIAGNOSIS — M25552 Pain in left hip: Secondary | ICD-10-CM | POA: Diagnosis not present

## 2021-07-11 DIAGNOSIS — S72115D Nondisplaced fracture of greater trochanter of left femur, subsequent encounter for closed fracture with routine healing: Secondary | ICD-10-CM | POA: Diagnosis not present

## 2021-07-16 DIAGNOSIS — R262 Difficulty in walking, not elsewhere classified: Secondary | ICD-10-CM | POA: Diagnosis not present

## 2021-07-16 DIAGNOSIS — S72115D Nondisplaced fracture of greater trochanter of left femur, subsequent encounter for closed fracture with routine healing: Secondary | ICD-10-CM | POA: Diagnosis not present

## 2021-07-16 DIAGNOSIS — M6281 Muscle weakness (generalized): Secondary | ICD-10-CM | POA: Diagnosis not present

## 2021-07-16 DIAGNOSIS — M25552 Pain in left hip: Secondary | ICD-10-CM | POA: Diagnosis not present

## 2021-07-18 DIAGNOSIS — M1990 Unspecified osteoarthritis, unspecified site: Secondary | ICD-10-CM | POA: Diagnosis not present

## 2021-07-18 DIAGNOSIS — Z0000 Encounter for general adult medical examination without abnormal findings: Secondary | ICD-10-CM | POA: Diagnosis not present

## 2021-07-18 DIAGNOSIS — M8580 Other specified disorders of bone density and structure, unspecified site: Secondary | ICD-10-CM | POA: Diagnosis not present

## 2021-07-18 DIAGNOSIS — N3010 Interstitial cystitis (chronic) without hematuria: Secondary | ICD-10-CM | POA: Diagnosis not present

## 2021-07-18 DIAGNOSIS — I779 Disorder of arteries and arterioles, unspecified: Secondary | ICD-10-CM | POA: Diagnosis not present

## 2021-07-18 DIAGNOSIS — N183 Chronic kidney disease, stage 3 unspecified: Secondary | ICD-10-CM | POA: Diagnosis not present

## 2021-07-18 DIAGNOSIS — R7301 Impaired fasting glucose: Secondary | ICD-10-CM | POA: Diagnosis not present

## 2021-07-18 DIAGNOSIS — G720 Drug-induced myopathy: Secondary | ICD-10-CM | POA: Diagnosis not present

## 2021-07-18 DIAGNOSIS — F411 Generalized anxiety disorder: Secondary | ICD-10-CM | POA: Diagnosis not present

## 2021-07-18 DIAGNOSIS — J309 Allergic rhinitis, unspecified: Secondary | ICD-10-CM | POA: Diagnosis not present

## 2021-07-18 DIAGNOSIS — I129 Hypertensive chronic kidney disease with stage 1 through stage 4 chronic kidney disease, or unspecified chronic kidney disease: Secondary | ICD-10-CM | POA: Diagnosis not present

## 2021-07-18 DIAGNOSIS — F39 Unspecified mood [affective] disorder: Secondary | ICD-10-CM | POA: Diagnosis not present

## 2021-09-03 ENCOUNTER — Other Ambulatory Visit: Payer: Self-pay | Admitting: Obstetrics & Gynecology

## 2021-09-03 ENCOUNTER — Other Ambulatory Visit: Payer: Self-pay | Admitting: Family Medicine

## 2021-09-03 DIAGNOSIS — Z1231 Encounter for screening mammogram for malignant neoplasm of breast: Secondary | ICD-10-CM

## 2021-09-19 ENCOUNTER — Ambulatory Visit
Admission: RE | Admit: 2021-09-19 | Discharge: 2021-09-19 | Disposition: A | Discharge status: Home / Self Care | Payer: Medicare Other | Source: Ambulatory Visit | Attending: Family Medicine | Admitting: Family Medicine

## 2021-09-19 DIAGNOSIS — Z1231 Encounter for screening mammogram for malignant neoplasm of breast: Secondary | ICD-10-CM | POA: Diagnosis not present

## 2021-09-20 ENCOUNTER — Other Ambulatory Visit: Payer: Self-pay | Admitting: Family Medicine

## 2021-09-20 DIAGNOSIS — R928 Other abnormal and inconclusive findings on diagnostic imaging of breast: Secondary | ICD-10-CM

## 2021-10-15 ENCOUNTER — Ambulatory Visit
Admission: RE | Admit: 2021-10-15 | Discharge: 2021-10-15 | Disposition: A | Discharge status: Home / Self Care | Payer: Medicare Other | Source: Ambulatory Visit | Attending: Family Medicine | Admitting: Family Medicine

## 2021-10-15 DIAGNOSIS — R922 Inconclusive mammogram: Secondary | ICD-10-CM | POA: Diagnosis not present

## 2021-10-15 DIAGNOSIS — N6001 Solitary cyst of right breast: Secondary | ICD-10-CM | POA: Diagnosis not present

## 2021-10-15 DIAGNOSIS — R928 Other abnormal and inconclusive findings on diagnostic imaging of breast: Secondary | ICD-10-CM

## 2022-01-04 IMAGING — CT CT PELVIS W/O CM
2 of 3 series · 17 of 46 positions shown, 19 images · non-contrast
Comparison: None.

CLINICAL DATA: Status post fall.  Pelvic fracture per [HOSPITAL].

EXAM:
CT PELVIS WITHOUT CONTRAST
TECHNIQUE: Multidetector CT imaging of the pelvis was performed following the
standard protocol without intravenous contrast.

[Series 3: axial soft tissue · axial · 0.82mm/px · z∈[+806,+1068]mm · 14 of 151 slices shown, 16 images]
[im 10/151  soft-tissue]
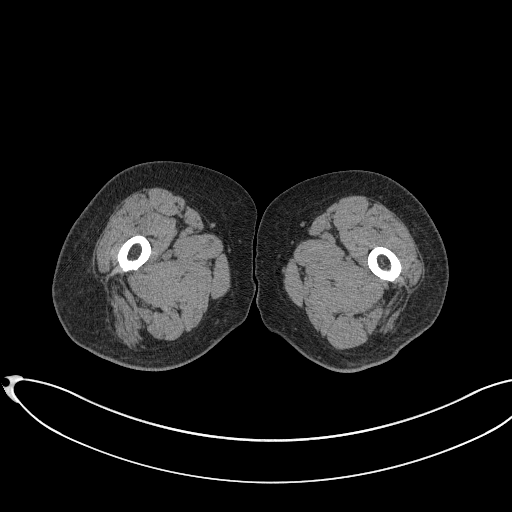
[im 10/151  bone]
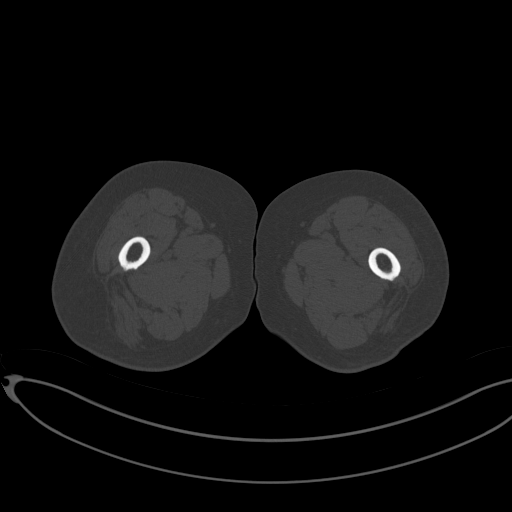
[im 20/151  soft-tissue]
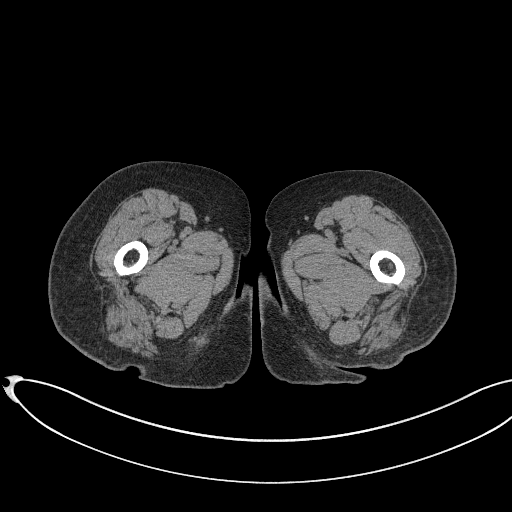
[im 30/151  soft-tissue]
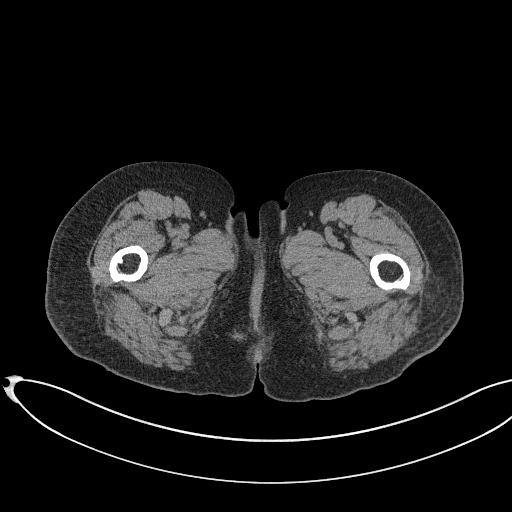
[im 39/151  soft-tissue]
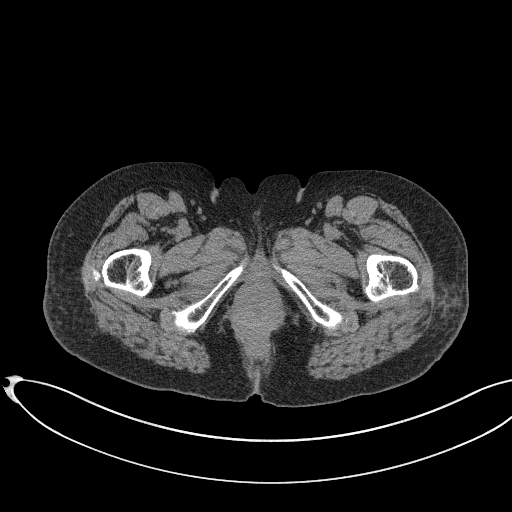
[im 49/151  soft-tissue]
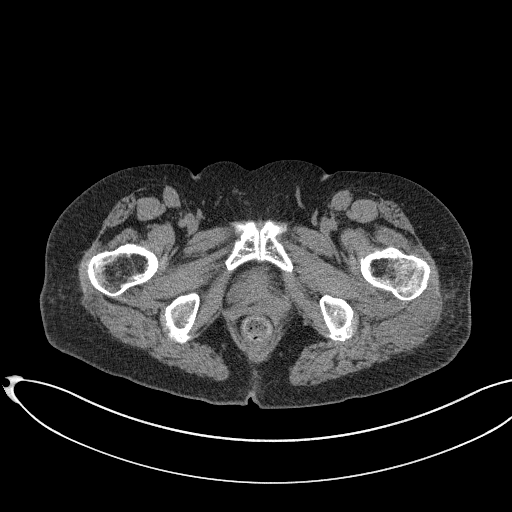
[im 59/151  soft-tissue]
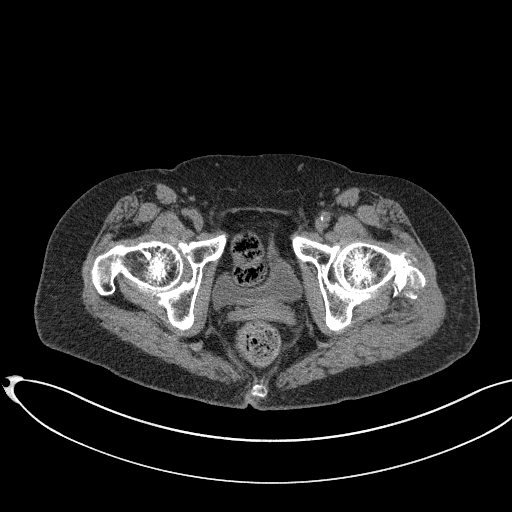
[im 68/151  soft-tissue]
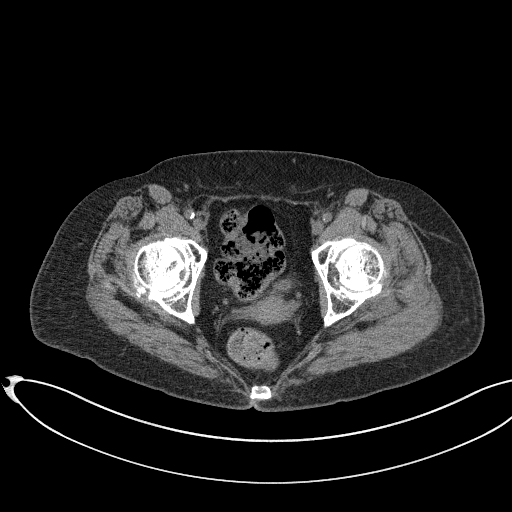
[im 83/151  soft-tissue]
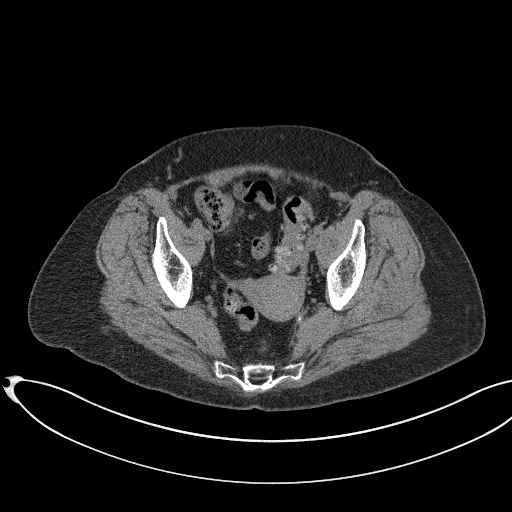
[im 92/151  soft-tissue]
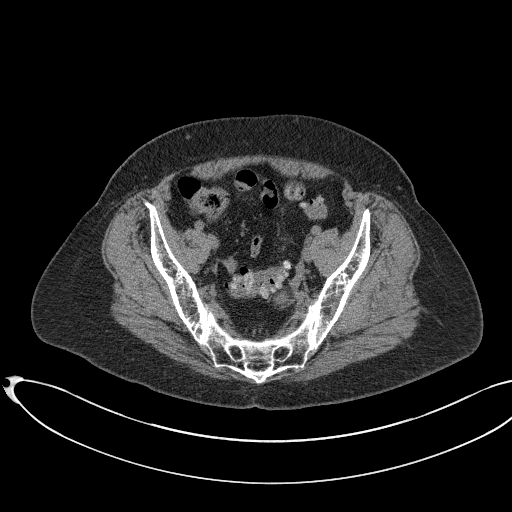
[im 92/151  bone]
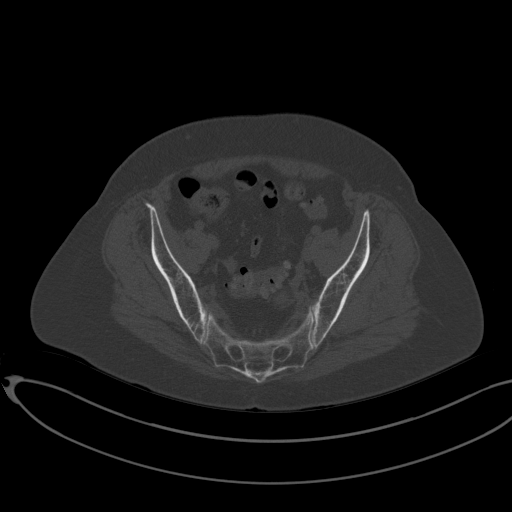
[im 102/151  soft-tissue]
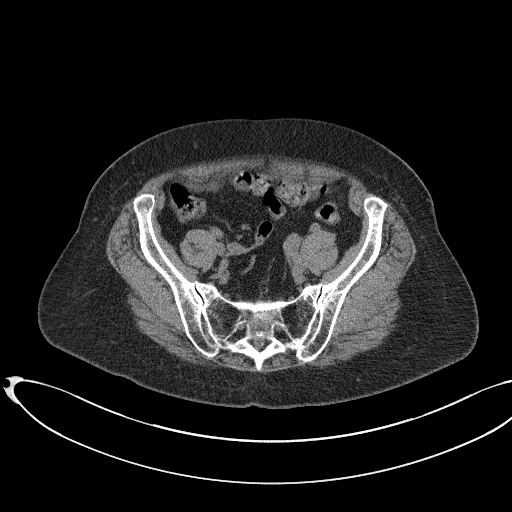
[im 112/151  soft-tissue]
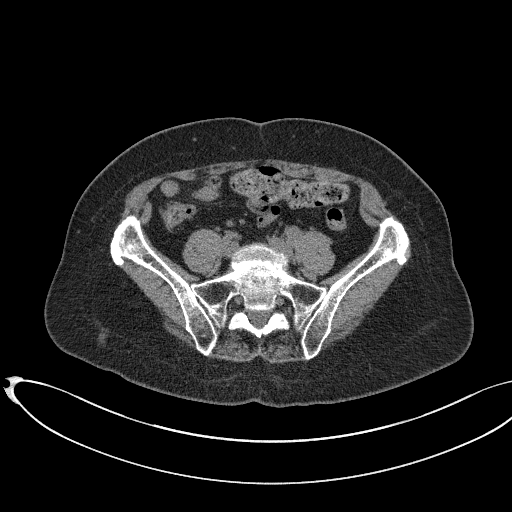
[im 121/151  soft-tissue]
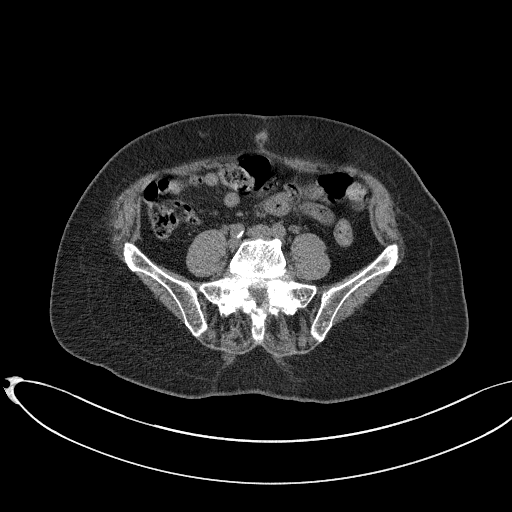
[im 131/151  soft-tissue]
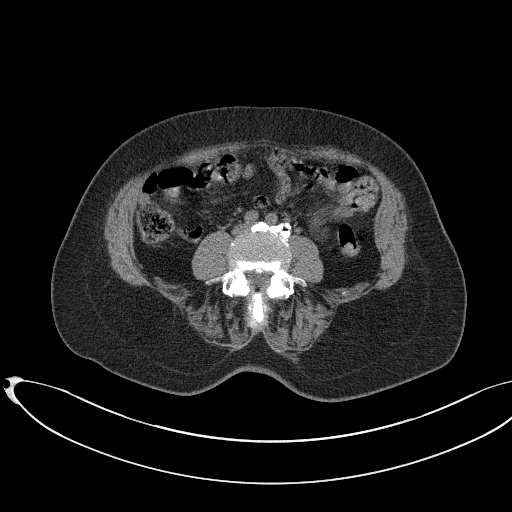
[im 141/151  soft-tissue]
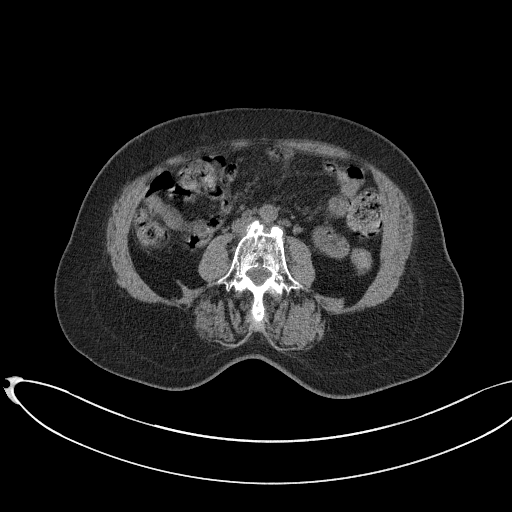

[Series 8: coronal st · coronal · 0.66mm/px · 3 of 141 slices shown]
[im 47/141  soft-tissue]
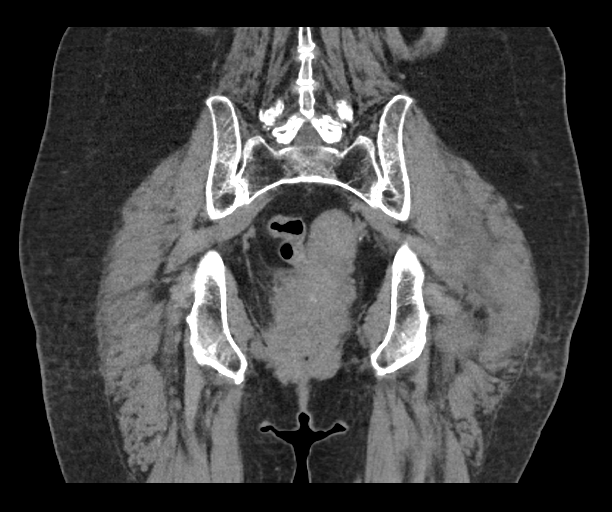
[im 63/141  soft-tissue]
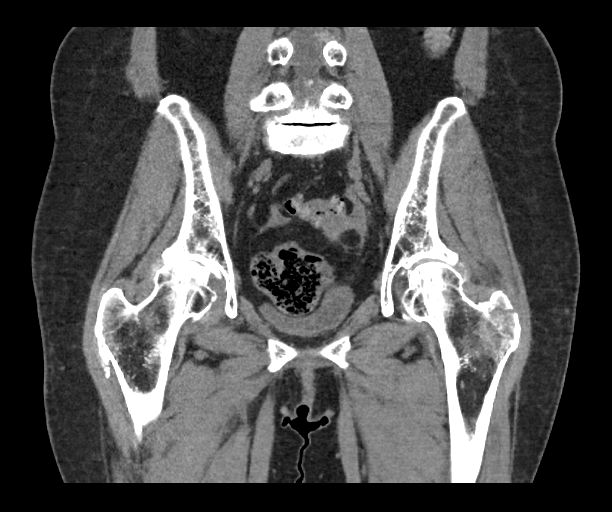
[im 78/141  soft-tissue]
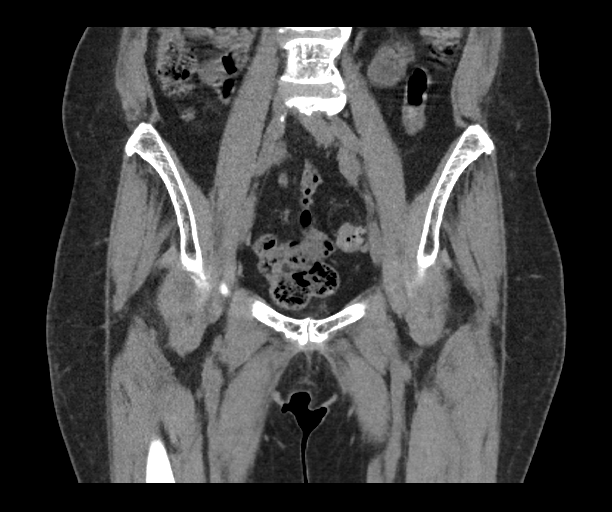

[17 of 46 positions shown; findings below may reference images not displayed]

FINDINGS: Urinary Tract:  No abnormality visualized.

Bowel: Diffuse sigmoid diverticulosis. Otherwise unremarkable
visualized pelvic bowel loops.

Vascular/Lymphatic: Atherosclerotic plaque. No pathologically
enlarged lymph nodes. No significant vascular abnormality seen.

Reproductive: Calcified lesions within the uterus likely
degenerative uterine fibroids. Otherwise the uterus and bilateral
adnexal regions are unremarkable.

Other: No intraperitoneal free fluid. No intraperitoneal free gas.
No organized fluid collection.

Musculoskeletal: Comminuted left greater trochanteric fracture
([DATE]). No acute displaced fracture of the right hip. No acute
displaced fracture of the pelvis. No pelvic diastasis. No hip
dislocation bilaterally.
IMPRESSION: 1. Comminuted left greater trochanteric fracture.
2. Other nonacute findings as above.
3.  Aortic Atherosclerosis (VPD2G-JL1.1).

## 2022-01-16 DIAGNOSIS — G720 Drug-induced myopathy: Secondary | ICD-10-CM | POA: Diagnosis not present

## 2022-01-16 DIAGNOSIS — H2513 Age-related nuclear cataract, bilateral: Secondary | ICD-10-CM | POA: Diagnosis not present

## 2022-01-16 DIAGNOSIS — I129 Hypertensive chronic kidney disease with stage 1 through stage 4 chronic kidney disease, or unspecified chronic kidney disease: Secondary | ICD-10-CM | POA: Diagnosis not present

## 2022-01-16 DIAGNOSIS — N183 Chronic kidney disease, stage 3 unspecified: Secondary | ICD-10-CM | POA: Diagnosis not present

## 2022-01-16 DIAGNOSIS — R7301 Impaired fasting glucose: Secondary | ICD-10-CM | POA: Diagnosis not present

## 2022-01-16 DIAGNOSIS — H524 Presbyopia: Secondary | ICD-10-CM | POA: Diagnosis not present

## 2022-01-16 DIAGNOSIS — I779 Disorder of arteries and arterioles, unspecified: Secondary | ICD-10-CM | POA: Diagnosis not present

## 2022-01-16 DIAGNOSIS — F39 Unspecified mood [affective] disorder: Secondary | ICD-10-CM | POA: Diagnosis not present

## 2022-01-16 DIAGNOSIS — M8580 Other specified disorders of bone density and structure, unspecified site: Secondary | ICD-10-CM | POA: Diagnosis not present

## 2022-01-16 DIAGNOSIS — J309 Allergic rhinitis, unspecified: Secondary | ICD-10-CM | POA: Diagnosis not present

## 2022-01-16 DIAGNOSIS — F411 Generalized anxiety disorder: Secondary | ICD-10-CM | POA: Diagnosis not present

## 2022-01-16 DIAGNOSIS — M1990 Unspecified osteoarthritis, unspecified site: Secondary | ICD-10-CM | POA: Diagnosis not present

## 2022-01-16 DIAGNOSIS — N3010 Interstitial cystitis (chronic) without hematuria: Secondary | ICD-10-CM | POA: Diagnosis not present

## 2022-01-16 DIAGNOSIS — H25013 Cortical age-related cataract, bilateral: Secondary | ICD-10-CM | POA: Diagnosis not present

## 2022-01-17 ENCOUNTER — Other Ambulatory Visit: Payer: Self-pay | Admitting: Family Medicine

## 2022-01-17 DIAGNOSIS — I779 Disorder of arteries and arterioles, unspecified: Secondary | ICD-10-CM

## 2022-01-31 ENCOUNTER — Ambulatory Visit
Admission: RE | Admit: 2022-01-31 | Discharge: 2022-01-31 | Disposition: A | Discharge status: Home / Self Care | Payer: Medicare Other | Source: Ambulatory Visit | Attending: Family Medicine | Admitting: Family Medicine

## 2022-01-31 DIAGNOSIS — I779 Disorder of arteries and arterioles, unspecified: Secondary | ICD-10-CM

## 2022-01-31 DIAGNOSIS — I6523 Occlusion and stenosis of bilateral carotid arteries: Secondary | ICD-10-CM | POA: Diagnosis not present

## 2022-02-27 ENCOUNTER — Other Ambulatory Visit: Payer: Self-pay | Admitting: *Deleted

## 2022-02-27 DIAGNOSIS — R0989 Other specified symptoms and signs involving the circulatory and respiratory systems: Secondary | ICD-10-CM

## 2022-03-19 ENCOUNTER — Ambulatory Visit (HOSPITAL_COMMUNITY)
Admission: RE | Admit: 2022-03-19 | Discharge: 2022-03-19 | Disposition: A | Discharge status: Home / Self Care | Payer: Medicare Other | Source: Ambulatory Visit | Attending: Vascular Surgery | Admitting: Vascular Surgery

## 2022-03-19 ENCOUNTER — Encounter: Payer: Self-pay | Admitting: Vascular Surgery

## 2022-03-19 ENCOUNTER — Ambulatory Visit (INDEPENDENT_AMBULATORY_CARE_PROVIDER_SITE_OTHER): Payer: Medicare Other | Admitting: Vascular Surgery

## 2022-03-19 VITALS — BP 171/83 | HR 71 | Temp 97.9°F | Ht 59.0 in | Wt 121.9 lb

## 2022-03-19 DIAGNOSIS — R0989 Other specified symptoms and signs involving the circulatory and respiratory systems: Secondary | ICD-10-CM

## 2022-03-19 DIAGNOSIS — I6521 Occlusion and stenosis of right carotid artery: Secondary | ICD-10-CM | POA: Diagnosis not present

## 2022-03-19 NOTE — Progress Notes (Signed)
Patient ID: Amber Barry, female   DOB: 1941-03-01, 81 y.o.   MRN: [id number]  Reason for Consult: Follow-up   Referred by Blair HeysEhinger, Robert, MD  Subjective:     HPI:  Amber Barry is a 81 y.o. female with a history of carotid endarterectomy 9 years ago now followed for asymptomatic high-grade common carotid stenosis.  She was last seen prior to COVID.  She continues on aspirin she cannot take statins.  She is really not had any issues denies any stroke, TIA or amaurosis.  Past Medical History:  Diagnosis Date   Anemia    Anxiety    Arthritis    Osteoarthritis   Bursitis    left hip-had injection 2015   Carotid artery occlusion April 2014   Left Bruit, 80% blockage   CKD (chronic kidney disease)    Stage III   Diverticulitis    Hematuria    Hypertension    Interstitial cystitis    Lower back pain June 2015   Ocular migraine    Osteopenia    PONV (postoperative nausea and vomiting)    Family History  Problem Relation Age of Onset   COPD Mother        Respiratory Disease   Rheumatic fever Mother    Pulmonary disease Mother    Heart attack Mother    Stroke Father    Hypertension Father    Diabetes Paternal Aunt    Prostate cancer Maternal Grandfather    Hypertension Paternal Aunt    Hypertension Paternal Grandmother    Other Maternal Grandmother        hardening arteries   Stroke Other        paternal side   Past Surgical History:  Procedure Laterality Date   ENDARTERECTOMY Right 12/22/2012   Procedure: Right Carotid Endarterectomy with hemashield patch angioplasty;  Surgeon: Pryor OchoaJames D Lawson, MD;  [facility];  Service: Vascular;  Laterality: Right;   TONSILLECTOMY  1947    Short Social History:  Social History   Tobacco Use   Smoking status: Never   Smokeless tobacco: Never  Substance Use Topics   Alcohol use: No    Allergies  Allergen Reactions   Benicar Hct [Olmesartan Medoxomil-Hctz] Swelling    Facial swelling   Buspar [Buspirone] Other  (See Comments)    Facial Dysesthesia   Norvasc [Amlodipine Besylate] Swelling    legs   Zoloft [Sertraline Hcl] Anxiety    Increased anxiety, dysesthesia   Lipitor [Atorvastatin] Other (See Comments)    Generalized muscle aches   Lisinopril Swelling    Around eyes   Motrin [Ibuprofen] Other (See Comments)     Contraindicated with Celebrex. Pt states MD said to not take together.   Amitriptyline Hcl Anxiety    And Paresthesias   Pravastatin Other (See Comments)    Pt. Wanted to sleep, no energy.    Current Outpatient Medications  Medication Sig Dispense Refill   acetaminophen (TYLENOL) 500 MG tablet Take 500 mg by mouth every 6 (six) hours as needed.     ALPRAZolam (XANAX) 0.25 MG tablet Take 0.125 mg by mouth 2 (two) times daily as needed for anxiety.      aspirin EC 81 MG tablet Take 243 mg by mouth daily. Pt  Takes x's 3  81 mg tabs per day     azelastine (ASTELIN) 0.1 % nasal spray Place 2 sprays into both nostrils 2 (two) times daily as needed for rhinitis or allergies. As needed  12   Calcium-Vitamin D (CALTRATE 600 PLUS-VIT D PO) Take 1 tablet by mouth daily at 12 noon.     Celecoxib (CELEBREX PO) Take 200 mg by mouth daily as needed (pain). Not more than 2 per week     Cholecalciferol (VITAMIN D) 2000 UNITS CAPS Take 2,000 Units by mouth daily at 12 noon.      escitalopram (LEXAPRO) 10 MG tablet TK 1 T PO QD  6   hydrochlorothiazide (HYDRODIURIL) 25 MG tablet Take 25 mg by mouth daily.   3   moexipril (UNIVASC) 15 MG tablet Take 15 mg by mouth daily.     Probiotic Product (ALIGN) 4 MG CAPS Take 4 mg by mouth daily.     Pseudoephedrine HCl (SUDAFED PO) Take 30 mg by mouth as needed (allergies).      No current facility-administered medications for this visit.    Review of Systems  Constitutional:  Constitutional negative. HENT: HENT negative.  Eyes: Eyes negative.  Respiratory: Respiratory negative.  Cardiovascular: Cardiovascular negative.  GI: Gastrointestinal  negative.  Musculoskeletal: Musculoskeletal negative.  Skin: Skin negative.  Neurological: Neurological negative. Hematologic: Hematologic/lymphatic negative.  Psychiatric: Psychiatric negative.        Objective:  Objective   Vitals:   03/19/22 1056 03/19/22 1058  BP: (!) 172/77 (!) 171/83  Pulse: 73 71  Temp: 97.9 F (36.6 C)   TempSrc: Temporal   SpO2: 97%   Weight: 121 lb 14.4 oz (55.3 kg)   Height: 4\' 11"  (1.499 m)    Body mass index is 24.62 kg/m.  Physical Exam HENT:     Head: Normocephalic.     Nose: Nose normal.  Eyes:     Pupils: Pupils are equal, round, and reactive to light.  Neck:     Vascular: Carotid bruit present.  Cardiovascular:     Rate and Rhythm: Normal rate.     Pulses: Normal pulses.  Pulmonary:     Effort: Pulmonary effort is normal.     Breath sounds: Normal breath sounds.  Abdominal:     General: Abdomen is flat.     Palpations: Abdomen is soft. There is no mass.  Musculoskeletal:     Cervical back: Normal range of motion.  Skin:    General: Skin is warm.     Capillary Refill: Capillary refill takes less than 2 seconds.  Neurological:     General: No focal deficit present.     Mental Status: She is alert and oriented to person, place, and time.  Psychiatric:        Mood and Affect: Mood normal.        Behavior: Behavior normal.        Thought Content: Thought content normal.        Judgment: Judgment normal.     Data: Right Carotid Findings:  +----------+--------+--------+--------+------------------+-----------------  ----+            PSV cm/sEDV cm/sStenosisPlaque DescriptionComments                +----------+--------+--------+--------+------------------+-----------------  ----+  CCA Prox  67      16                                                        +----------+--------+--------+--------+------------------+-----------------  ----+  CCA Mid   65  16                                                         +----------+--------+--------+--------+------------------+-----------------  ----+  CCA [id number]     118     >50%    heterogenous      Very High grade                                                              Distal CCA -  Bulb                                                          stenosis.                +----------+--------+--------+--------+------------------+-----------------  ----+  ICA Prox  285     47                                High 40-59% to  low                                                         60-79% based on                                                              velocities alone        +----------+--------+--------+--------+------------------+-----------------  ----+  ICA Mid   105     23                                                        +----------+--------+--------+--------+------------------+-----------------  ----+  ICA Distal75      27                                                        +----------+--------+--------+--------+------------------+-----------------  ----+  ECA       65                                                                +----------+--------+--------+--------+------------------+-----------------  ----+   +----------+--------+-------+----------------+-------------------+  PSV cm/sEDV cmsDescribe        Arm Pressure (mmHG)  +----------+--------+-------+----------------+-------------------+  [id number]            Multiphasic, [id number]                  +----------+--------+-------+----------------+-------------------+   +---------+--------+--+--------+--+---------+  VertebralPSV cm/s72EDV cm/s15Antegrade  +---------+--------+--+--------+--+---------+      Left Carotid Findings:  +----------+--------+--------+--------+---------------------+--------------  ----+            PSV cm/sEDV  cm/sStenosisPlaque Description   Comments              +----------+--------+--------+--------+---------------------+--------------  ----+  CCA Prox  103     19                                                         +----------+--------+--------+--------+---------------------+--------------  ----+  CCA Mid   90      19                                                         +----------+--------+--------+--------+---------------------+--------------  ----+  CCA Distal94      20                                                         +----------+--------+--------+--------+---------------------+--------------  ----+  ICA Prox  135     40      40-59%  focal, smooth and    Lower end of  range                                    heterogenous                               +----------+--------+--------+--------+---------------------+--------------  ----+  ICA Mid   124     35                                                         +----------+--------+--------+--------+---------------------+--------------  ----+  ICA Distal106     22                                                         +----------+--------+--------+--------+---------------------+--------------  ----+  ECA       217     20                                                         +----------+--------+--------+--------+---------------------+--------------  ----+   +----------+--------+--------+----------------+-------------------+  PSV cm/sEDV cm/sDescribe        Arm Pressure (mmHG)  +----------+--------+--------+----------------+-------------------+  [id number]             Multiphasic, [id number]                  +----------+--------+--------+----------------+-------------------+   +---------+--------+--+--------+--+---------+  VertebralPSV cm/s53EDV cm/s15Antegrade  +---------+--------+--+--------+--+---------+            Summary:  Right Carotid: Hemodynamically significant plaque >50% visualized in the  CCA. RT                 distal CCA and Bulb/bifurcation stenosis PSV 488 EDV 118  with                 blunted and turbulent ICA waveforms. This velocity would  rate                 80-99% if it was in the ICA origin. Based on velocities  alone the                 RT ICA would rate 40-59% based on lower EDV or 60-79% based  on                 elevated PSV.     Vertebrals:  Bilateral vertebral arteries demonstrate antegrade flow.  Subclavians: Normal flow hemodynamics were seen in bilateral subclavian               arteries.      Assessment/Plan:    81 year old female with above-noted previous history now with high-grade carotid stenosis with slight increase in velocities from previous evaluation which was prior to COVID.  I have again discussed with her the options which would be surgical repair versus CT scan versus continued watchful waiting.  Patient does take an aspirin but cannot tolerate statins.  For this reason we will refer her to the lipid clinic for evaluation of alternative therapies.  At this time patient would like to follow-up in 6 months with repeat carotid duplex.  Ultimately if she has continued worsening stenosis we may need to consider CT scan and surgical intervention as I do not think that she is a candidate for stent given the location and the mismatch in size proximally and distally to the stenosis from previous CTA.  All questions were answered today as well as signs and symptoms of stroke in the presence of her daughter and they demonstrate good understanding.    Maeola Harman MD [facility]

## 2022-03-24 ENCOUNTER — Other Ambulatory Visit: Payer: Self-pay

## 2022-03-24 DIAGNOSIS — I6521 Occlusion and stenosis of right carotid artery: Secondary | ICD-10-CM

## 2022-05-31 DIAGNOSIS — Z23 Encounter for immunization: Secondary | ICD-10-CM | POA: Diagnosis not present

## 2022-08-07 DIAGNOSIS — M1990 Unspecified osteoarthritis, unspecified site: Secondary | ICD-10-CM | POA: Diagnosis not present

## 2022-08-07 DIAGNOSIS — I779 Disorder of arteries and arterioles, unspecified: Secondary | ICD-10-CM | POA: Diagnosis not present

## 2022-08-07 DIAGNOSIS — G720 Drug-induced myopathy: Secondary | ICD-10-CM | POA: Diagnosis not present

## 2022-08-07 DIAGNOSIS — F411 Generalized anxiety disorder: Secondary | ICD-10-CM | POA: Diagnosis not present

## 2022-08-07 DIAGNOSIS — I129 Hypertensive chronic kidney disease with stage 1 through stage 4 chronic kidney disease, or unspecified chronic kidney disease: Secondary | ICD-10-CM | POA: Diagnosis not present

## 2022-08-07 DIAGNOSIS — Z0000 Encounter for general adult medical examination without abnormal findings: Secondary | ICD-10-CM | POA: Diagnosis not present

## 2022-08-07 DIAGNOSIS — I700 Atherosclerosis of aorta: Secondary | ICD-10-CM | POA: Diagnosis not present

## 2022-08-07 DIAGNOSIS — F39 Unspecified mood [affective] disorder: Secondary | ICD-10-CM | POA: Diagnosis not present

## 2022-08-07 DIAGNOSIS — Z1331 Encounter for screening for depression: Secondary | ICD-10-CM | POA: Diagnosis not present

## 2022-08-07 DIAGNOSIS — M8580 Other specified disorders of bone density and structure, unspecified site: Secondary | ICD-10-CM | POA: Diagnosis not present

## 2022-08-07 DIAGNOSIS — N183 Chronic kidney disease, stage 3 unspecified: Secondary | ICD-10-CM | POA: Diagnosis not present

## 2022-08-07 DIAGNOSIS — R7301 Impaired fasting glucose: Secondary | ICD-10-CM | POA: Diagnosis not present

## 2022-08-28 ENCOUNTER — Encounter (HOSPITAL_BASED_OUTPATIENT_CLINIC_OR_DEPARTMENT_OTHER): Payer: Self-pay | Admitting: Urology

## 2022-08-28 ENCOUNTER — Other Ambulatory Visit: Payer: Self-pay

## 2022-08-28 ENCOUNTER — Emergency Department (HOSPITAL_BASED_OUTPATIENT_CLINIC_OR_DEPARTMENT_OTHER): Payer: Medicare Other

## 2022-08-28 ENCOUNTER — Emergency Department (HOSPITAL_BASED_OUTPATIENT_CLINIC_OR_DEPARTMENT_OTHER)
Admission: EM | Admit: 2022-08-28 | Discharge: 2022-08-29 | Disposition: A | Discharge status: Home / Self Care | Payer: Medicare Other | Attending: Emergency Medicine | Admitting: Emergency Medicine

## 2022-08-28 DIAGNOSIS — Z79899 Other long term (current) drug therapy: Secondary | ICD-10-CM | POA: Insufficient documentation

## 2022-08-28 DIAGNOSIS — Z7982 Long term (current) use of aspirin: Secondary | ICD-10-CM | POA: Insufficient documentation

## 2022-08-28 DIAGNOSIS — R42 Dizziness and giddiness: Secondary | ICD-10-CM

## 2022-08-28 DIAGNOSIS — M47812 Spondylosis without myelopathy or radiculopathy, cervical region: Secondary | ICD-10-CM | POA: Diagnosis not present

## 2022-08-28 DIAGNOSIS — I10 Essential (primary) hypertension: Secondary | ICD-10-CM

## 2022-08-28 DIAGNOSIS — R791 Abnormal coagulation profile: Secondary | ICD-10-CM | POA: Insufficient documentation

## 2022-08-28 DIAGNOSIS — I6523 Occlusion and stenosis of bilateral carotid arteries: Secondary | ICD-10-CM | POA: Diagnosis not present

## 2022-08-28 LAB — RAPID URINE DRUG SCREEN, HOSP PERFORMED
Amphetamines: NOT DETECTED
Barbiturates: NOT DETECTED
Benzodiazepines: NOT DETECTED
Cocaine: NOT DETECTED
Opiates: NOT DETECTED
Tetrahydrocannabinol: NOT DETECTED

## 2022-08-28 LAB — URINALYSIS, MICROSCOPIC (REFLEX): WBC, UA: NONE SEEN WBC/hpf (ref 0–5)

## 2022-08-28 LAB — PROTIME-INR
INR: 1 (ref 0.8–1.2)
Prothrombin Time: 13.2 seconds (ref 11.4–15.2)

## 2022-08-28 LAB — CBC WITH DIFFERENTIAL/PLATELET
Abs Immature Granulocytes: 0.02 10*3/uL (ref 0.00–0.07)
Basophils Absolute: 0 10*3/uL (ref 0.0–0.1)
Basophils Relative: 0 %
Eosinophils Absolute: 0.4 10*3/uL (ref 0.0–0.5)
Eosinophils Relative: 5 %
HCT: 35.6 % — ABNORMAL LOW (ref 36.0–46.0)
Hemoglobin: 11.7 g/dL — ABNORMAL LOW (ref 12.0–15.0)
Immature Granulocytes: 0 %
Lymphocytes Relative: 20 %
Lymphs Abs: 1.5 10*3/uL (ref 0.7–4.0)
MCH: 29.4 pg (ref 26.0–34.0)
MCHC: 32.9 g/dL (ref 30.0–36.0)
MCV: 89.4 fL (ref 80.0–100.0)
Monocytes Absolute: 0.9 10*3/uL (ref 0.1–1.0)
Monocytes Relative: 12 %
Neutro Abs: 4.6 10*3/uL (ref 1.7–7.7)
Neutrophils Relative %: 63 %
Platelets: 257 10*3/uL (ref 150–400)
RBC: 3.98 MIL/uL (ref 3.87–5.11)
RDW: 12 % (ref 11.5–15.5)
WBC: 7.5 10*3/uL (ref 4.0–10.5)
nRBC: 0 % (ref 0.0–0.2)

## 2022-08-28 LAB — BASIC METABOLIC PANEL
Anion gap: 10 (ref 5–15)
BUN: 24 mg/dL — ABNORMAL HIGH (ref 8–23)
CO2: 22 mmol/L (ref 22–32)
Calcium: 9.5 mg/dL (ref 8.9–10.3)
Chloride: 101 mmol/L (ref 98–111)
Creatinine, Ser: 0.91 mg/dL (ref 0.44–1.00)
GFR, Estimated: >60 mL/min (ref >60)
Glucose, Bld: 111 mg/dL — ABNORMAL HIGH (ref 70–99)
Potassium: 4.2 mmol/L (ref 3.5–5.1)
Sodium: 133 mmol/L — ABNORMAL LOW (ref 135–145)

## 2022-08-28 LAB — URINALYSIS, ROUTINE W REFLEX MICROSCOPIC
Bilirubin Urine: NEGATIVE
Glucose, UA: NEGATIVE mg/dL
Hgb urine dipstick: NEGATIVE
Ketones, ur: NEGATIVE mg/dL
Nitrite: NEGATIVE
Protein, ur: NEGATIVE mg/dL
Specific Gravity, Urine: 1.01 (ref 1.005–1.030)
pH: 6 (ref 5.0–8.0)

## 2022-08-28 LAB — COMPREHENSIVE METABOLIC PANEL WITH GFR
ALT: 13 U/L (ref 0–44)
AST: 21 U/L (ref 15–41)
Albumin: 3.9 g/dL (ref 3.5–5.0)
Alkaline Phosphatase: 62 U/L (ref 38–126)
Anion gap: 8 (ref 5–15)
BUN: 23 mg/dL (ref 8–23)
CO2: 25 mmol/L (ref 22–32)
Calcium: 9.3 mg/dL (ref 8.9–10.3)
Chloride: 101 mmol/L (ref 98–111)
Creatinine, Ser: 0.96 mg/dL (ref 0.44–1.00)
GFR, Estimated: 59 mL/min — ABNORMAL LOW
Glucose, Bld: 120 mg/dL — ABNORMAL HIGH (ref 70–99)
Potassium: 4.2 mmol/L (ref 3.5–5.1)
Sodium: 134 mmol/L — ABNORMAL LOW (ref 135–145)
Total Bilirubin: 0.4 mg/dL (ref 0.3–1.2)
Total Protein: 7.7 g/dL (ref 6.5–8.1)

## 2022-08-28 LAB — APTT: aPTT: 28 seconds (ref 24–36)

## 2022-08-28 LAB — ETHANOL: Alcohol, Ethyl (B): <10 mg/dL

## 2022-08-28 MED ORDER — ALPRAZOLAM 0.5 MG PO TABS
0.5000 mg | ORAL_TABLET | Freq: Once | ORAL | Status: AC
  Administered 2022-08-28: 0.5 mg via ORAL
  Filled 2022-08-28: qty 1

## 2022-08-28 MED ORDER — LORAZEPAM 2 MG/ML IJ SOLN: 1.0000 mg | Freq: Once | INTRAMUSCULAR | Status: DC | PRN

## 2022-08-28 MED ORDER — IOHEXOL 350 MG/ML SOLN
75.0000 mL | Freq: Once | INTRAVENOUS | Status: AC | PRN
  Administered 2022-08-28: 75 mL via INTRAVENOUS

## 2022-08-28 NOTE — ED Notes (Signed)
Amber Barry for ED to ED transport @ 2259

## 2022-08-28 NOTE — ED Notes (Signed)
Attempted IV in R.AC x 1, unsuccessful, IV would not advance

## 2022-08-28 NOTE — ED Notes (Signed)
Pt in CT. Will get vitals upon return.

## 2022-08-28 NOTE — ED Triage Notes (Signed)
Pt ambulatory to triage and states she has been feeling light headed x 1.5 hrs  States worse with walking  States BP at home was 158/84

## 2022-08-28 NOTE — ED Provider Notes (Signed)
[facility] EMERGENCY DEPARTMENT Provider Note   CSN: [id number] Arrival date & time: 08/28/22  1557     History  Chief Complaint  Patient presents with   Dizziness    Amber Barry is a 82 y.o. female.  HPI Patient had a busy morning.  She had run errands and then met a friend out for lunch.  She felt fine all through lunch.  When they stood up from the table she suddenly felt a little lightheaded or just unusual.  She would not say it was actually a dizzy type of spinning this but felt a little off balance.  She walked out of the restaurant with her friend and then sat down on a bench.  She reports she felt better sitting down.  They walked over to her car and then decided that it was not a good idea to drive so the friend drove her to her daughter's house.  Patient reports once they are and seated she felt like the symptoms were improved.  He did not have any headache associated with this.  No visual change.  No focal weakness numbness or tingling of extremities.  She reports the prevailing symptoms seem to be a sense of being vaguely lightheaded or may be off balance.  Home they checked her blood pressures and they were 150s over 80s which is high for her baseline.  Her daughter reports at 1 point they were up to 631S systolic which is quite high for the patient at baseline.  Patient denies she had any nausea, visual change or headache.  She reports she does have a known carotid stenosis on the right.    Home Medications Prior to Admission medications   Medication Sig Start Date End Date Taking? Authorizing Provider  acetaminophen (TYLENOL) 500 MG tablet Take 500 mg by mouth every 6 (six) hours as needed.    [provider]  ALPRAZolam Duanne Moron) 0.25 MG tablet Take 0.125 mg by mouth 2 (two) times daily as needed for anxiety.     [provider]  aspirin EC 81 MG tablet Take 243 mg by mouth daily. Pt  Takes x's 3  81 mg tabs per day    [provider]   azelastine (ASTELIN) 0.1 % nasal spray Place 2 sprays into both nostrils 2 (two) times daily as needed for rhinitis or allergies. As needed 11/15/14   [provider]  Calcium-Vitamin D (CALTRATE 600 PLUS-VIT D PO) Take 1 tablet by mouth daily at 12 noon.    [provider]  Celecoxib (CELEBREX PO) Take 200 mg by mouth daily as needed (pain). Not more than 2 per week    [provider]  Cholecalciferol (VITAMIN D) 2000 UNITS CAPS Take 2,000 Units by mouth daily at 12 noon.     [provider]  escitalopram (LEXAPRO) 10 MG tablet TK 1 T PO QD 03/25/17   [provider]  hydrochlorothiazide (HYDRODIURIL) 25 MG tablet Take 25 mg by mouth daily.  11/15/14   [provider]  moexipril (UNIVASC) 15 MG tablet Take 15 mg by mouth daily.    [provider]  Probiotic Product (ALIGN) 4 MG CAPS Take 4 mg by mouth daily.    [provider]  Pseudoephedrine HCl (SUDAFED PO) Take 30 mg by mouth as needed (allergies).     [provider]      Allergies    Benicar hct [olmesartan medoxomil-hctz], Buspar [buspirone], Norvasc [amlodipine besylate], Zoloft [sertraline hcl], Lipitor [  atorvastatin], Lisinopril, Motrin [ibuprofen], Amitriptyline hcl, and Pravastatin    Review of Systems   Review of Systems  Physical Exam Updated Vital Signs BP (!) 191/87   Pulse 88   Temp (!) 97.5 F (36.4 C) (Oral)   Resp 16   Ht 4\' 11"  (1.499 m)   Wt 55.3 kg   LMP 08/11/2002   SpO2 98%   BMI 24.64 kg/m  Physical Exam Constitutional:      Appearance: Normal appearance.     Comments: Patient is well-nourished well-developed.  Excellent physical condition for age.  Alert in no distress.  HENT:     Head: Normocephalic and atraumatic.     Mouth/Throat:     Mouth: Mucous membranes are moist.     Pharynx: Oropharynx is clear.  Eyes:     Extraocular Movements: Extraocular movements intact.     Pupils: Pupils are equal, round, and reactive to  light.  Neck:     Vascular: Carotid bruit present.  Cardiovascular:     Rate and Rhythm: Normal rate and regular rhythm.  Pulmonary:     Effort: Pulmonary effort is normal.     Breath sounds: Normal breath sounds.  Abdominal:     General: There is no distension.     Palpations: Abdomen is soft.     Tenderness: There is no abdominal tenderness. There is no guarding.  Musculoskeletal:        General: No swelling. Normal range of motion.     Cervical back: Neck supple.     Right lower leg: No edema.     Left lower leg: No edema.  Skin:    General: Skin is warm and dry.  Neurological:     Mental Status: She is alert.     Comments: Patient is alert with clear mental status.  Normal cognitive function.  Normal speech.  No aphasias.  Finger nose exam intact bilaterally.  Bilateral heel shin exam normal.  Motor strength 5\5 upper and lower extremities.  Psychiatric:        Mood and Affect: Mood normal.     ED Results / Procedures / Treatments   Labs (all labs ordered are listed, but only abnormal results are displayed) Labs Reviewed  CBC WITH DIFFERENTIAL/PLATELET - Abnormal; Notable for the following components:      Result Value   Hemoglobin 11.7 (*)    HCT 35.6 (*)    All other components within normal limits  COMPREHENSIVE METABOLIC PANEL - Abnormal; Notable for the following components:   Sodium 134 (*)    Glucose, Bld 120 (*)    GFR, Estimated 59 (*)    All other components within normal limits  BASIC METABOLIC PANEL  ETHANOL  PROTIME-INR  APTT  RAPID URINE DRUG SCREEN, HOSP PERFORMED  URINALYSIS, ROUTINE W REFLEX MICROSCOPIC    EKG EKG Interpretation  Date/Time:  Thursday August 28 2022 16:17:23 EST Ventricular Rate:  82 PR Interval:  110 QRS Duration: 94 QT Interval:  358 QTC Calculation: 418 R Axis:   79 Text Interpretation: Sinus rhythm with short PR Otherwise normal ECG When compared with ECG of 13-Feb-2017 09:42, PREVIOUS ECG IS PRESENT similar to  previous with slight decrease IVCD Confirmed by Charlesetta Shanks [phone]) on 08/28/2022 7:16:40 PM  Radiology No results found.  Procedures Procedures    Medications Ordered in ED Medications - No data to display  ED Course/ Medical Decision Making/ A&P  Medical Decision Making Amount and/or Complexity of Data Reviewed Labs: ordered. Radiology: ordered.  Risk Prescription drug management.   Patient at baseline is healthy but has a known right carotid artery stenosis.  She was well today and then had a sensation of lightheadedness or possibly vertigo upon standing at approximately 1:15.  Symptoms did improve with being stationary.  There were no associated neurologic deficits or headache.  Differential diagnosis is includes CVA\hypoperfusion with carotid stenosis\peripheral vertigo.  Patient is hypertensive in the emergency department.  Systolic blood pressures have been ranging from 191-1 70s.  This is high from the patient's baseline by report is well-controlled at home.  Will proceed with CT scan with angiogram and basic lab work as well as EKGs.  Will confirm blood pressure readings bilaterally at rest.  Consult: 19: 38 reviewed with neurology D. Sal.  Currently recommends against blood pressure lowering in light of known critical carotid stenosis and possible CVA.  Permissive hypertension up to 220/110.  Consult: 22: 20 reviewed with Dr. Sherral Hammers vascular surgery.  80% stenosis not responsible for patient's symptoms.  No emergent interventions needed based on prior management and current symptoms.  Stable for continued outpatient management from perspective of the carotid stenosis.  Patient's blood pressure has spontaneously improved to normotensive.  She is asymptomatic at rest.  Still concern for possible CVA with vertiginous symptoms and patient with risk factors.  Will proceed with MRI.  If MRI negative patient likely appropriate for discharge with  vertigo.  Reviewed with Dr. Myer Peer for ED to ED transfer for MRI.        Final Clinical Impression(s) / ED Diagnoses Final diagnoses:  Lightheadedness  Hypertension, unspecified type    Rx / DC Orders ED Discharge Orders     None         Arby Barrette, MD 08/30/22 [phone]

## 2022-08-29 ENCOUNTER — Emergency Department (HOSPITAL_COMMUNITY): Payer: Medicare Other

## 2022-08-29 DIAGNOSIS — I10 Essential (primary) hypertension: Secondary | ICD-10-CM | POA: Diagnosis not present

## 2022-08-29 DIAGNOSIS — R42 Dizziness and giddiness: Secondary | ICD-10-CM | POA: Diagnosis not present

## 2022-08-29 MED ORDER — LORAZEPAM 2 MG/ML IJ SOLN
1.0000 mg | Freq: Once | INTRAMUSCULAR | Status: AC
  Administered 2022-08-29: 1 mg via INTRAVENOUS
  Filled 2022-08-29: qty 1

## 2022-08-29 NOTE — ED Notes (Signed)
Patient transported to MRI 

## 2022-08-29 NOTE — ED Provider Notes (Signed)
Patient transferred from [facility] 2/2 dizziness for MRI to rule out CVA.   MRI reassuring.  Patient had Ativan and still walks little bit tentatively but she feels like it is probably her baseline given that she had Ativan.  Her blood pressure is improved but will keep a log for the next week until she can follow-up with her doctor.  Will return here for any new or worsening symptoms.   Merrily Pew, MD 08/29/22 [phone]

## 2022-08-29 NOTE — Discharge Instructions (Signed)
Please record your blood pressures 2-3 times a day for the next week. If persistently above 366 systolic (top number) follow up with your PCP for blood pressure management. If you have any symptoms such as chest pain, severe headache, leg swelling, shortness of breath then return to the emergency department for repeat evaluation.

## 2022-08-29 NOTE — ED Triage Notes (Signed)
Pt bib [facility] from [facility] as a MRI transfer. Reporting dizziness today. Axo x4

## 2022-09-01 ENCOUNTER — Ambulatory Visit: Payer: Medicare Other | Attending: Internal Medicine | Admitting: Internal Medicine

## 2022-09-01 ENCOUNTER — Encounter: Payer: Self-pay | Admitting: Internal Medicine

## 2022-09-01 VITALS — BP 134/86 | HR 100 | Ht <= 58 in | Wt 124.4 lb

## 2022-09-01 DIAGNOSIS — E785 Hyperlipidemia, unspecified: Secondary | ICD-10-CM

## 2022-09-01 DIAGNOSIS — T466X5A Adverse effect of antihyperlipidemic and antiarteriosclerotic drugs, initial encounter: Secondary | ICD-10-CM | POA: Diagnosis not present

## 2022-09-01 DIAGNOSIS — M791 Myalgia, unspecified site: Secondary | ICD-10-CM | POA: Insufficient documentation

## 2022-09-01 DIAGNOSIS — I6521 Occlusion and stenosis of right carotid artery: Secondary | ICD-10-CM

## 2022-09-01 MED ORDER — ROSUVASTATIN CALCIUM 5 MG PO TABS: 5.0000 mg | ORAL_TABLET | Freq: Every day | ORAL | 3 refills | Status: DC

## 2022-09-01 NOTE — Patient Instructions (Signed)
Medication Instructions:  START rosuvastatin (crestor) 5mg  daily   *If you need a refill on your cardiac medications before your next appointment, please call your pharmacy*   Lab Work: FASTING lab work to check cholesterol in about 3-4 months ** complete 1 week before next visit  If you have labs (blood work) drawn today and your tests are completely normal, you will receive your results only by: [place] (if you have MyChart) OR A paper copy in the mail If you have any lab test that is abnormal or we need to change your treatment, we will call you to review the results.   Follow-Up: At [facility], you and your health needs are our priority.  As part of our continuing mission to provide you with exceptional heart care, we have created designated Provider Care Teams.  These Care Teams include your primary Cardiologist (physician) and Advanced Practice Providers (APPs -  Physician Assistants and Nurse Practitioners) who all work together to provide you with the care you need, when you need it.  We recommend signing up for the patient portal called "MyChart".  Sign up information is provided on this After Visit Summary.  MyChart is used to connect with patients for Virtual Visits (Telemedicine).  Patients are able to view lab/test results, encounter notes, upcoming appointments, etc.  Non-urgent messages can be sent to your provider as well.   To learn more about what you can do with MyChart, go to [email or web address].    Your next appointment:    3-4 months with Dr. Debara Pickett

## 2022-09-01 NOTE — Progress Notes (Signed)
LIPID CLINIC CONSULT NOTE  Chief Complaint:  Manage dyslipidemia  Primary Care Physician: Gaynelle Arabian, MD  Primary Cardiologist:  None  HPI:  CARIZMA DUNSWORTH is a 82 y.o. female who is being seen today for the evaluation of anemia at the request of Bobette Mo*.  This is a pleasant 82 year old female kindly referred for evaluation and management of dyslipidemia.  She has a history of carotid artery disease with prior right carotid intervention by Dr. Kellie Simmering in the past.  She is currently followed by Dr. Donzetta Matters and recently has had some worsening stenosis of the right carotid.  She was in the severe range based on Doppler velocities back in August however plan was to repeat Dopplers in 6 months per her request.  Unfortunately recently she had a spike in blood pressure as well as lightheadedness and presented to the emergency department a few days ago for evaluation.  She underwent CT angiography of the neck which showed about 80% right carotid artery stenosis and then ultimately had an MRI of the head which showed no evidence of acute stroke.  Etiology of her elevated blood pressure was not clear.  She had no chest pain or shortness of breath.  She says she has some anxiety.  She has not been on any lipid-lowering therapy for quite some time after having failed atorvastatin and pravastatin in the past.  Her last lipid profile in December showed total cholesterol 138, triglycerides 81, HDL 40 and LDL 82.  PMHx:  Past Medical History:  Diagnosis Date   Anemia    Anxiety    Arthritis    Osteoarthritis   Bursitis    left hip-had injection 2015   Carotid artery occlusion April 2014   Left Bruit, 80% blockage   CKD (chronic kidney disease)    Stage III   Diverticulitis    Hematuria    Hypertension    Interstitial cystitis    Lower back pain June 2015   Ocular migraine    Osteopenia    PONV (postoperative nausea and vomiting)     Past Surgical History:  Procedure  Laterality Date   ENDARTERECTOMY Right 12/22/2012   Procedure: Right Carotid Endarterectomy with hemashield patch angioplasty;  Surgeon: Mal Misty, MD;  Location: [facility] OR;  Service: Vascular;  Laterality: Right;   TONSILLECTOMY  1947    FAMHx:  Family History  Problem Relation Age of Onset   COPD Mother        Respiratory Disease   Rheumatic fever Mother    Pulmonary disease Mother    Heart attack Mother    Stroke Father    Hypertension Father    Diabetes Paternal Aunt    Prostate cancer Maternal Grandfather    Hypertension Paternal Aunt    Hypertension Paternal Grandmother    Other Maternal Grandmother        hardening arteries   Stroke Other        paternal side    SOCHx:   reports that she has never smoked. She has never used smokeless tobacco. She reports that she does not drink alcohol and does not use drugs.  ALLERGIES:  Allergies  Allergen Reactions   Benicar Hct [Olmesartan Medoxomil-Hctz] Swelling    Facial swelling   Buspar [Buspirone] Other (See Comments)    Facial Dysesthesia   Norvasc [Amlodipine Besylate] Swelling    legs   Zoloft [Sertraline Hcl] Anxiety    Increased anxiety, dysesthesia   Lipitor [Atorvastatin] Other (See Comments)  Generalized muscle aches   Lisinopril Swelling    Around eyes   Motrin [Ibuprofen] Other (See Comments)     Contraindicated with Celebrex. Pt states MD said to not take together.   Amitriptyline Hcl Anxiety    And Paresthesias   Pravastatin Other (See Comments)    Pt. Wanted to sleep, no energy.    ROS: Pertinent items noted in HPI and remainder of comprehensive ROS otherwise negative.  HOME MEDS: Current Outpatient Medications on File Prior to Visit  Medication Sig Dispense Refill   acetaminophen (TYLENOL) 500 MG tablet Take 500 mg by mouth every 6 (six) hours as needed.     ALPRAZolam (XANAX) 0.25 MG tablet Take 0.125 mg by mouth 2 (two) times daily as needed for anxiety.      aspirin EC 81 MG tablet Take  243 mg by mouth daily. Pt  Takes x's 3  81 mg tabs per day     azelastine (ASTELIN) 0.1 % nasal spray Place 2 sprays into both nostrils 2 (two) times daily as needed for rhinitis or allergies. As needed  12   Calcium-Vitamin D (CALTRATE 600 PLUS-VIT D PO) Take 1 tablet by mouth daily at 12 noon.     Celecoxib (CELEBREX PO) Take 200 mg by mouth daily as needed (pain). Not more than 2 per week     Cholecalciferol (VITAMIN D) 2000 UNITS CAPS Take 2,000 Units by mouth daily at 12 noon.      escitalopram (LEXAPRO) 10 MG tablet TK 1 T PO QD  6   hydrochlorothiazide (HYDRODIURIL) 25 MG tablet Take 25 mg by mouth daily.   3   moexipril (UNIVASC) 15 MG tablet Take 15 mg by mouth daily.     Probiotic Product (ALIGN) 4 MG CAPS Take 4 mg by mouth daily.     Pseudoephedrine HCl (SUDAFED PO) Take 30 mg by mouth as needed (allergies).      No current facility-administered medications on file prior to visit.    LABS/IMAGING: No results found for this or any previous visit (from the past 48 hour(s)). No results found.  LIPID PANEL: No results found for: "CHOL", "TRIG", "HDL", "CHOLHDL", "VLDL", "LDLCALC", "LDLDIRECT"  WEIGHTS: Wt Readings from Last 3 Encounters:  09/01/22 124 lb 6.4 oz (56.4 kg)  08/28/22 122 lb (55.3 kg)  03/19/22 121 lb 14.4 oz (55.3 kg)    VITALS: BP 134/86 (BP Location: Left Arm, Patient Position: Sitting, Cuff Size: Normal)   Pulse 100   Ht 4\' 10"  (1.473 m)   Wt 124 lb 6.4 oz (56.4 kg)   LMP 08/11/2002   SpO2 98%   BMI 26.00 kg/m   EXAM: Deferred  EKG: Deferred  ASSESSMENT: Mixed dyslipidemia, goal LDL less than 70 History of statin intolerance-myalgias, fatigue Severe right internal carotid artery stenosis with prior right carotid endarterectomy  PLAN: 1.   Ms. Haynie has a mixed dyslipidemia with cholesterol but still above target LDL less than 70.  She cannot take atorvastatin or pravastatin in the past which caused side effects.  We discussed several options  of treatment and I think that we would have to consider either other statin therapy, ezetimibe or possibly PCSK9 inhibitor.  I think the most cost effective option with possibly low to no side effects would be to consider an alternative statin such as low-dose rosuvastatin 5 mg daily.  This could be associated with about a 35% reduction in LDL which would reach target and provide some of the pleiotropic benefits of  statin therapy which are greater than ezetimibe.  If this cannot be tolerated, we could consider ezetimibe or PCSK9 inhibitor however I think it is less likely that we will get approval for PCSK9 inhibitor.  She is agreeable to trying low-dose statin therapy.  She should reach out if she has any side effects but otherwise we will plan to repeat lipid and NMR in about 3 to 4 months.  Thanks again for the kind referral.  Pixie Casino, MD, FACC, [place] Director of the [facility] Diplomate of the American Board of Clinical Lipidology Attending Cardiologist  Direct Dial: [phone]  Fax: [phone]  Website:  [email or web address] 09/01/2022, 10:37 AM

## 2022-09-03 DIAGNOSIS — R42 Dizziness and giddiness: Secondary | ICD-10-CM | POA: Diagnosis not present

## 2022-09-03 DIAGNOSIS — F411 Generalized anxiety disorder: Secondary | ICD-10-CM | POA: Diagnosis not present

## 2022-09-03 DIAGNOSIS — I779 Disorder of arteries and arterioles, unspecified: Secondary | ICD-10-CM | POA: Diagnosis not present

## 2022-09-03 DIAGNOSIS — I129 Hypertensive chronic kidney disease with stage 1 through stage 4 chronic kidney disease, or unspecified chronic kidney disease: Secondary | ICD-10-CM | POA: Diagnosis not present

## 2022-09-15 ENCOUNTER — Other Ambulatory Visit: Payer: Self-pay | Admitting: Family Medicine

## 2022-09-15 DIAGNOSIS — Z1231 Encounter for screening mammogram for malignant neoplasm of breast: Secondary | ICD-10-CM

## 2022-09-23 ENCOUNTER — Other Ambulatory Visit: Payer: Self-pay | Admitting: *Deleted

## 2022-09-23 DIAGNOSIS — I6521 Occlusion and stenosis of right carotid artery: Secondary | ICD-10-CM

## 2022-09-25 DIAGNOSIS — I129 Hypertensive chronic kidney disease with stage 1 through stage 4 chronic kidney disease, or unspecified chronic kidney disease: Secondary | ICD-10-CM | POA: Diagnosis not present

## 2022-09-25 DIAGNOSIS — F411 Generalized anxiety disorder: Secondary | ICD-10-CM | POA: Diagnosis not present

## 2022-09-25 DIAGNOSIS — I779 Disorder of arteries and arterioles, unspecified: Secondary | ICD-10-CM | POA: Diagnosis not present

## 2022-10-09 ENCOUNTER — Ambulatory Visit (INDEPENDENT_AMBULATORY_CARE_PROVIDER_SITE_OTHER): Payer: Medicare Other | Admitting: Physician Assistant

## 2022-10-09 ENCOUNTER — Ambulatory Visit (HOSPITAL_COMMUNITY)
Admission: RE | Admit: 2022-10-09 | Discharge: 2022-10-09 | Disposition: A | Discharge status: Home / Self Care | Payer: Medicare Other | Source: Ambulatory Visit | Attending: Vascular Surgery | Admitting: Vascular Surgery

## 2022-10-09 VITALS — BP 152/66 | HR 74 | Temp 97.2°F | Resp 20 | Ht <= 58 in | Wt 124.4 lb

## 2022-10-09 DIAGNOSIS — I6521 Occlusion and stenosis of right carotid artery: Secondary | ICD-10-CM | POA: Insufficient documentation

## 2022-10-09 NOTE — Progress Notes (Unsigned)
History of Present Illness:  Patient is a 82 y.o. year old female who presents for evaluation of carotid stenosis.  She has a distant history of right CEA 2009 for asymptomatic high-grade common carotid stenosis.  She denise changes in her medical history other wise.   She was last seen by Dr. Donzetta Matters 03/19/2022.    The patient denies symptoms of TIA, amaurosis, or stroke.  She is still taking 3 81 mg ASA a day and has recently been placed on Crestor for Statin therapy.  She has had an intolerance for Statins prior to this.          Past Medical History:  Diagnosis Date   Anemia    Anxiety    Arthritis    Osteoarthritis   Bursitis    left hip-had injection 2015   Carotid artery occlusion April 2014   Left Bruit, 80% blockage   CKD (chronic kidney disease)    Stage III   Diverticulitis    Hematuria    Hypertension    Interstitial cystitis    Lower back pain June 2015   Ocular migraine    Osteopenia    PONV (postoperative nausea and vomiting)     Past Surgical History:  Procedure Laterality Date   ENDARTERECTOMY Right 12/22/2012   Procedure: Right Carotid Endarterectomy with hemashield patch angioplasty;  Surgeon: Mal Misty, MD;  Location: [facility] OR;  Service: Vascular;  Laterality: Right;   TONSILLECTOMY  1947     Social History Social History   Tobacco Use   Smoking status: Never    Passive exposure: Never   Smokeless tobacco: Never  Vaping Use   Vaping Use: Never used  Substance Use Topics   Alcohol use: No   Drug use: No    Family History Family History  Problem Relation Age of Onset   COPD Mother        Respiratory Disease   Rheumatic fever Mother    Pulmonary disease Mother    Heart attack Mother    Stroke Father    Hypertension Father    Diabetes Paternal Aunt    Prostate cancer Maternal Grandfather    Hypertension Paternal Aunt    Hypertension Paternal Grandmother    Other Maternal Grandmother        hardening arteries   Stroke Other         paternal side    Allergies  Allergies  Allergen Reactions   Benicar Hct [Olmesartan Medoxomil-Hctz] Swelling    Facial swelling   Buspar [Buspirone] Other (See Comments)    Facial Dysesthesia   Norvasc [Amlodipine Besylate] Swelling    legs   Zoloft [Sertraline Hcl] Anxiety    Increased anxiety, dysesthesia   Lipitor [Atorvastatin] Other (See Comments)    Generalized muscle aches   Lisinopril Swelling    Around eyes   Motrin [Ibuprofen] Other (See Comments)     Contraindicated with Celebrex. Pt states MD said to not take together.   Amitriptyline Hcl Anxiety    And Paresthesias   Pravastatin Other (See Comments)    Pt. Wanted to sleep, no energy.     Current Outpatient Medications  Medication Sig Dispense Refill   acetaminophen (TYLENOL) 500 MG tablet Take 500 mg by mouth every 6 (six) hours as needed.     ALPRAZolam (XANAX) 0.25 MG tablet Take 0.125 mg by mouth 2 (two) times daily as needed for anxiety.      aspirin EC 81 MG tablet Take  243 mg by mouth daily. Pt  Takes x's 3  81 mg tabs per day     azelastine (ASTELIN) 0.1 % nasal spray Place 2 sprays into both nostrils 2 (two) times daily as needed for rhinitis or allergies. As needed  12   Calcium-Vitamin D (CALTRATE 600 PLUS-VIT D PO) Take 1 tablet by mouth daily at 12 noon.     Celecoxib (CELEBREX PO) Take 200 mg by mouth daily as needed (pain). Not more than 2 per week     Cholecalciferol (VITAMIN D) 2000 UNITS CAPS Take 2,000 Units by mouth daily at 12 noon.      escitalopram (LEXAPRO) 10 MG tablet TK 1 T PO QD  6   hydrochlorothiazide (HYDRODIURIL) 25 MG tablet Take 25 mg by mouth daily.   3   moexipril (UNIVASC) 15 MG tablet Take 15 mg by mouth daily.     Probiotic Product (ALIGN) 4 MG CAPS Take 4 mg by mouth daily.     Pseudoephedrine HCl (SUDAFED PO) Take 30 mg by mouth as needed (allergies).      rosuvastatin (CRESTOR) 5 MG tablet Take 1 tablet (5 mg total) by mouth daily. 90 tablet 3   No current  facility-administered medications for this visit.    ROS:   General:  No weight loss, Fever, chills  HEENT: No recent headaches, no nasal bleeding, no visual changes, no sore throat  Neurologic: No dizziness, blackouts, seizures. No recent symptoms of stroke or mini- stroke. No recent episodes of slurred speech, or temporary blindness.  Cardiac: No recent episodes of chest pain/pressure, no shortness of breath at rest.  No shortness of breath with exertion.  Denies history of atrial fibrillation or irregular heartbeat  Vascular: No history of rest pain in feet.  No history of claudication.  No history of non-healing ulcer, No history of DVT   Pulmonary: No home oxygen, no productive cough, no hemoptysis,  No asthma or wheezing  Musculoskeletal:  [ ]  Arthritis, [ ]  Low back pain,  [ ]  Joint pain  Hematologic:No history of hypercoagulable state.  No history of easy bleeding.  No history of anemia  Gastrointestinal: No hematochezia or melena,  No gastroesophageal reflux, no trouble swallowing  Urinary: [ ]  chronic Kidney disease, [ ]  on HD - [ ]  MWF or [ ]  TTHS, [ ]  Burning with urination, [ ]  Frequent urination, [ ]  Difficulty urinating;   Skin: No rashes  Psychological: No history of anxiety,  No history of depression   Physical Examination  Vitals:   10/09/22 1512 10/09/22 1514  BP: (!) 173/72 (!) 152/66  Pulse: 74   Resp: 20   Temp: (!) 97.2 F (36.2 C)   TempSrc: Temporal   SpO2: 99%   Weight: 124 lb 6.4 oz (56.4 kg)   Height: 4\' 10"  (1.473 m)     Body mass index is 26 kg/m.  General:  Alert and oriented, no acute distress HEENT: Normal Neck: No bruit or JVD Pulmonary: Clear to auscultation bilaterally Cardiac: Regular Rate and Rhythm without murmur Gastrointestinal: Soft, non-tender, non-distended, no mass, no scars Skin: No rash Extremity Pulses:   radial pulses bilaterally Musculoskeletal: No deformity or edema  Neurologic: Upper and lower extremity motor  grossly intact and symmetric  DATA:  Right Carotid Findings:  +----------+--------+--------+--------+------------------+--------+           PSV cm/sEDV cm/sStenosisPlaque DescriptionComments  +----------+--------+--------+--------+------------------+--------+  CCA Prox  81      20                                          +----------+--------+--------+--------+------------------+--------+  CCA Distal402     91      >50%    heterogenous                +----------+--------+--------+--------+------------------+--------+  ICA Prox  126     6       1-39%                               +----------+--------+--------+--------+------------------+--------+  ICA Mid   76      20                                          +----------+--------+--------+--------+------------------+--------+  ICA Distal62      13                                          +----------+--------+--------+--------+------------------+--------+  ECA      98      7               heterogenous                +----------+--------+--------+--------+------------------+--------+   +----------+--------+-------+----------------+-------------------+           PSV cm/sEDV cmsDescribe        Arm Pressure (mmHG)  +----------+--------+-------+----------------+-------------------+  [id number]    0      Multiphasic, WNL                     +----------+--------+-------+----------------+-------------------+   +---------+--------+--+--------+--+---------+  VertebralPSV cm/s82EDV cm/s22Antegrade  +---------+--------+--+--------+--+---------+      Left Carotid Findings:  +----------+--------+--------+--------+---------------------------+--------  +           PSV cm/sEDV cm/sStenosisPlaque Description          Comments  +----------+--------+--------+--------+---------------------------+--------  +  CCA Prox  105     16                                                     +----------+--------+--------+--------+---------------------------+--------  +  CCA Distal79      14                                                    +----------+--------+--------+--------+---------------------------+--------  +  ICA Prox  105     24      1-39%   hypoechoic and heterogenous           +----------+--------+--------+--------+---------------------------+--------  +  ICA Mid   143     25                                                    +----------+--------+--------+--------+---------------------------+--------  +  ICA Distal107     17                                                    +----------+--------+--------+--------+---------------------------+--------  +  ECA      64      0                                                     +----------+--------+--------+--------+---------------------------+--------  +   +----------+--------+--------+----------------+-------------------+           PSV cm/sEDV cm/sDescribe        Arm Pressure (mmHG)  +----------+--------+--------+----------------+-------------------+  [id number]    0       Multiphasic, WNL                     +----------+--------+--------+----------------+-------------------+   +---------+--------+--+--------+--+---------+  VertebralPSV cm/s49EDV cm/s13Antegrade  +---------+--------+--+--------+--+---------+         Summary:  Right Carotid: Velocities in the right ICA are consistent with a 1-39%  stenosis.                The ECA appears >50% stenosed.   Left Carotid: Velocities in the left ICA are consistent with a 1-39%  stenosis.   Vertebrals: Bilateral vertebral arteries demonstrate antegrade flow.  Subclavians: Normal flow hemodynamics were seen in bilateral subclavian               arteries.    ASSESSMENT:  History of CEA 2009   PLAN:   Roxy Horseman PA-C [facility] Office: [phone]  MD in clinic [place]

## 2022-10-10 ENCOUNTER — Other Ambulatory Visit: Payer: Self-pay

## 2022-10-10 DIAGNOSIS — R0989 Other specified symptoms and signs involving the circulatory and respiratory systems: Secondary | ICD-10-CM

## 2022-10-10 DIAGNOSIS — I6521 Occlusion and stenosis of right carotid artery: Secondary | ICD-10-CM

## 2022-10-25 ENCOUNTER — Encounter: Payer: Self-pay | Admitting: Physician Assistant

## 2022-10-28 ENCOUNTER — Encounter: Payer: Self-pay | Admitting: Vascular Surgery

## 2022-11-04 ENCOUNTER — Ambulatory Visit
Admission: RE | Admit: 2022-11-04 | Discharge: 2022-11-04 | Disposition: A | Discharge status: Home / Self Care | Payer: Medicare Other | Source: Ambulatory Visit | Attending: Family Medicine | Admitting: Family Medicine

## 2022-11-04 DIAGNOSIS — Z1231 Encounter for screening mammogram for malignant neoplasm of breast: Secondary | ICD-10-CM

## 2022-11-19 ENCOUNTER — Ambulatory Visit (INDEPENDENT_AMBULATORY_CARE_PROVIDER_SITE_OTHER): Payer: Medicare Other | Admitting: Vascular Surgery

## 2022-11-19 ENCOUNTER — Encounter: Payer: Self-pay | Admitting: Vascular Surgery

## 2022-11-19 VITALS — BP 150/64 | HR 74 | Temp 98.0°F | Resp 20 | Ht <= 58 in | Wt 125.0 lb

## 2022-11-19 DIAGNOSIS — I6521 Occlusion and stenosis of right carotid artery: Secondary | ICD-10-CM

## 2022-11-19 NOTE — Progress Notes (Signed)
Patient ID: Amber Boydeneggy G Rayson, female   DOB: Aug 27, 1940, 82 y.o.   MRN: [id number]  Reason for Consult: Follow-up   Referred by Blair HeysEhinger, Robert, MD  Subjective:     HPI:  Amber Barry is a 82 y.o. female with history of right carotid endarterectomy for asymptomatic high-grade stenosis performed almost 10 years ago.  I have been following her for high-grade recurrent stenosis of the common carotid artery and she has been maintained on three 81 mg aspirin tablets daily and is recently started on statin which she is tolerating well.  She was also recently evaluated in the emergency department for lightheadedness found not to have a stroke but CT of her neck and head did demonstrate this high-grade stenosis.  She is now here to discuss her options.w  Past Medical History:  Diagnosis Date   Anemia    Anxiety    Arthritis    Osteoarthritis   Bursitis    left hip-had injection 2015   Carotid artery occlusion April 2014   Left Bruit, 80% blockage   CKD (chronic kidney disease)    Stage III   Diverticulitis    Hematuria    Hypertension    Interstitial cystitis    Lower back pain June 2015   Ocular migraine    Osteopenia    PONV (postoperative nausea and vomiting)    Family History  Problem Relation Age of Onset   COPD Mother        Respiratory Disease   Rheumatic fever Mother    Pulmonary disease Mother    Heart attack Mother    Stroke Father    Hypertension Father    Diabetes Paternal Aunt    Prostate cancer Maternal Grandfather    Hypertension Paternal Aunt    Hypertension Paternal Grandmother    Other Maternal Grandmother        hardening arteries   Stroke Other        paternal side   Past Surgical History:  Procedure Laterality Date   ENDARTERECTOMY Right 12/22/2012   Procedure: Right Carotid Endarterectomy with hemashield patch angioplasty;  Surgeon: Pryor OchoaJames D Lawson, MD;  [facility];  Service: Vascular;  Laterality: Right;   TONSILLECTOMY  1947    Short  Social History:  Social History   Tobacco Use   Smoking status: Never    Passive exposure: Never   Smokeless tobacco: Never  Substance Use Topics   Alcohol use: No    Allergies  Allergen Reactions   Benicar Hct [Olmesartan Medoxomil-Hctz] Swelling    Facial swelling   Buspar [Buspirone] Other (See Comments)    Facial Dysesthesia   Norvasc [Amlodipine Besylate] Swelling    legs   Zoloft [Sertraline Hcl] Anxiety    Increased anxiety, dysesthesia   Lipitor [Atorvastatin] Other (See Comments)    Generalized muscle aches   Lisinopril Swelling    Around eyes   Motrin [Ibuprofen] Other (See Comments)     Contraindicated with Celebrex. Pt states MD said to not take together.   Amitriptyline Hcl Anxiety    And Paresthesias   Pravastatin Other (See Comments)    Pt. Wanted to sleep, no energy.    Current Outpatient Medications  Medication Sig Dispense Refill   acetaminophen (TYLENOL) 500 MG tablet Take 500 mg by mouth every 6 (six) hours as needed.     ALPRAZolam (XANAX) 0.25 MG tablet Take 0.125 mg by mouth 2 (two) times daily as needed for anxiety.      aspirin  EC 81 MG tablet Take 243 mg by mouth daily. Pt  Takes x's 3  81 mg tabs per day     azelastine (ASTELIN) 0.1 % nasal spray Place 2 sprays into both nostrils 2 (two) times daily as needed for rhinitis or allergies. As needed  12   Calcium-Vitamin D (CALTRATE 600 PLUS-VIT D PO) Take 1 tablet by mouth daily at 12 noon.     Celecoxib (CELEBREX PO) Take 200 mg by mouth daily as needed (pain). Not more than 2 per week     Cholecalciferol (VITAMIN D) 2000 UNITS CAPS Take 2,000 Units by mouth daily at 12 noon.      escitalopram (LEXAPRO) 10 MG tablet TK 1 T PO QD  6   hydrochlorothiazide (HYDRODIURIL) 25 MG tablet Take 25 mg by mouth daily.   3   moexipril (UNIVASC) 15 MG tablet Take 15 mg by mouth daily.     Probiotic Product (ALIGN) 4 MG CAPS Take 4 mg by mouth daily.     Pseudoephedrine HCl (SUDAFED PO) Take 30 mg by mouth as  needed (allergies).      rosuvastatin (CRESTOR) 5 MG tablet Take 1 tablet (5 mg total) by mouth daily. 90 tablet 3   No current facility-administered medications for this visit.    Review of Systems  Constitutional:  Constitutional negative. HENT: HENT negative.  Eyes: Eyes negative.  Respiratory: Respiratory negative.  Cardiovascular: Cardiovascular negative.  GI: Gastrointestinal negative.  Musculoskeletal: Musculoskeletal negative.  Skin: Skin negative.  Neurological: Positive for light-headedness.  Hematologic: Hematologic/lymphatic negative.  Psychiatric: Psychiatric negative.        Objective:  Objective   Vitals:   11/19/22 1500  BP: (!) 150/64  Pulse: 74  Resp: 20  Temp: 98 F (36.7 C)  SpO2: 97%  Weight: 125 lb (56.7 kg)  Height: 4\' 10"  (1.473 m)   Body mass index is 26.13 kg/m.  Physical Exam HENT:     Nose: Nose normal.  Eyes:     Pupils: Pupils are equal, round, and reactive to light.  Neck:     Vascular: Carotid bruit present.  Cardiovascular:     Rate and Rhythm: Normal rate.     Pulses: Normal pulses.  Pulmonary:     Effort: Pulmonary effort is normal.     Breath sounds: Normal breath sounds.  Abdominal:     General: Abdomen is flat.     Palpations: Abdomen is soft.  Musculoskeletal:        General: Normal range of motion.     Left lower leg: No edema.  Skin:    General: Skin is warm and dry.     Capillary Refill: Capillary refill takes less than 2 seconds.  Psychiatric:        Mood and Affect: Mood normal.        Thought Content: Thought content normal.        Judgment: Judgment normal.     Data: Right Carotid Findings:  +----------+--------+--------+--------+------------------+--------+           PSV cm/sEDV cm/sStenosisPlaque DescriptionComments  +----------+--------+--------+--------+------------------+--------+  CCA Prox  81      20                                           +----------+--------+--------+--------+------------------+--------+  CCA Distal402     91      >50%    heterogenous                +----------+--------+--------+--------+------------------+--------+  ICA Prox  126     6       1-39%                               +----------+--------+--------+--------+------------------+--------+  ICA Mid   76      20                                          +----------+--------+--------+--------+------------------+--------+  ICA Distal62      13                                          +----------+--------+--------+--------+------------------+--------+  ECA      98      7               heterogenous                +----------+--------+--------+--------+------------------+--------+   +----------+--------+-------+----------------+-------------------+           PSV cm/sEDV cmsDescribe        Arm Pressure (mmHG)  +----------+--------+-------+----------------+-------------------+  [id number]    0      Multiphasic, WNL                     +----------+--------+-------+----------------+-------------------+   +---------+--------+--+--------+--+---------+  VertebralPSV cm/s82EDV cm/s22Antegrade  +---------+--------+--+--------+--+---------+      Left Carotid Findings:  +----------+--------+--------+--------+---------------------------+--------  +           PSV cm/sEDV cm/sStenosisPlaque Description          Comments  +----------+--------+--------+--------+---------------------------+--------  +  CCA Prox  105     16                                                    +----------+--------+--------+--------+---------------------------+--------  +  CCA Distal79      14                                                    +----------+--------+--------+--------+---------------------------+--------  +  ICA Prox  105     24      1-39%   hypoechoic and heterogenous            +----------+--------+--------+--------+---------------------------+--------  +  ICA Mid   143     25                                                    +----------+--------+--------+--------+---------------------------+--------  +  ICA Distal107     17                                                    +----------+--------+--------+--------+---------------------------+--------  +  ECA  64      0                                                     +----------+--------+--------+--------+---------------------------+--------  +   +----------+--------+--------+----------------+-------------------+           PSV cm/sEDV cm/sDescribe        Arm Pressure (mmHG)  +----------+--------+--------+----------------+-------------------+  [id number]    0       Multiphasic, WNL                     +----------+--------+--------+----------------+-------------------+   +---------+--------+--+--------+--+---------+  VertebralPSV cm/s49EDV cm/s13Antegrade  +---------+--------+--+--------+--+---------+         Summary:  Right Carotid: Velocities in the right ICA are consistent with a 1-39%  stenosis.                The ECA appears >50% stenosed.   Left Carotid: Velocities in the left ICA are consistent with a 1-39%  stenosis.   Vertebrals: Bilateral vertebral arteries demonstrate antegrade flow.  Subclavians: Normal flow hemodynamics were seen in bilateral subclavian               arteries.   CT IMPRESSION: 1. No acute intracranial process. 2. Approximately 80% stenosis in the mid to distal right common carotid artery, just proximal to the endarterectomy site, which has progressed from the prior exam. 3. Approximately 50% stenosis in the proximal left ICA, similar to the prior exam. 4. No intracranial large vessel occlusion or significant stenosis.     Assessment/Plan:    82 year old female with high-grade distal right common carotid artery  stenosis which really looks to be approximately 90 to 95%.  We discussed the options being medical therapy with the addition of Plavix to her 81 mg aspirin that she takes 3 times daily versus transfemoral stenting versus carotid endarterectomy.  I evaluated with ultrasound at bedside today and she simply does not have the room for transcarotid artery stenting.  I also discussed with her that given the short run way after the innominate artery she may be a very difficult transfemoral stent and given her age and possible difficulty of the aortic arch I have read to recommended carotid endarterectomy.  We discussed the risks particularly of stroke and injury to surrounding structures including major nerves and she demonstrates good understanding.  We are going to schedule her for the first week in June when her family is around to help with recovery but also she is being scheduled for phone discussion in 2 to 3 weeks to have her questions answered and discussed whether or not we will proceed.  Certainly we elected for no surgery I am not sure that we need to continue to follow this very high-grade stenosis which we know is present that we are not intervened upon.  All of her questions were answered in the presence of her daughter.     Maeola Harman MD [facility]

## 2022-11-20 ENCOUNTER — Telehealth: Payer: Self-pay | Admitting: Internal Medicine

## 2022-11-20 NOTE — Telephone Encounter (Signed)
   Pre-operative Risk Assessment    Patient Name: Amber Barry  DOB: Mar 21, 1941 MRN: [id number]      Request for Surgical Clearance    Procedure:   Right CEA   Date of Surgery:  Clearance 01/13/23                                 Surgeon:  Dr. Randie Heinz Surgeon's Group or Practice Name:   [facility] Phone number:  [phone] Fax number:  [phone]   Type of Clearance Requested:   - Medical  - Pharmacy:  Hold Aspirin TBD by cardiologist   Type of Anesthesia:  Not Indicated   Additional requests/questions:  Please fax a copy of medical clearance to the surgeon's office.  Mardelle Matte   11/20/2022, 10:36 AM

## 2022-11-20 NOTE — Telephone Encounter (Signed)
   Name: Amber Barry  DOB: 02-13-1941  MRN: [id number]  Primary Cardiologist: None   Preoperative team, please contact this patient and set up a phone call appointment for further preoperative risk assessment. Please obtain consent and complete medication review. Thank you for your help.  I confirm that guidance regarding antiplatelet and oral anticoagulation therapy has been completed and, if necessary, noted below.  Aspirin prescribed by a noncardiology provider.  Recommendation for holding aspirin deferred to prescribing provider.     Carlos Levering, NP 11/20/2022, 12:49 PM [facility]

## 2022-11-20 NOTE — Telephone Encounter (Signed)
I s/w the pt about scheduling a tele pre op appt. Pt tells me that someone told her daughter that pre op clearance can be taken care of when she see's DR. Hilty 12/02/22. I explained to the pt that it looks like Dr. Rennis Golden see 's her for her cholesterol. I stated that generally if he see's his pt's just for hyperlipidemia he doesn't generally provide pre op clearance. I do not see that the pt has another cardiologist and she was told that he would be able to do the clearance. I assured the pt that I will d/w MD to see if he will provide clearance when he does see her. I said he may not and say that we need to have a tele appt still. I informed the pt if that is the case we will call back and schedule a tele appt as well. Pt said ok and thank as she states she was told he could do the clearance and she would not need another appt.

## 2022-11-21 DIAGNOSIS — E785 Hyperlipidemia, unspecified: Secondary | ICD-10-CM | POA: Diagnosis not present

## 2022-11-26 LAB — LIPOPROTEIN A (LPA): Lipoprotein (a): 14.2 nmol/L (ref <75.0)

## 2022-11-26 LAB — NMR, LIPOPROFILE
Cholesterol, Total: 109 mg/dL (ref 100–199)
HDL Particle Number: 28.3 umol/L — ABNORMAL LOW (ref >=30.5)
HDL-C: 54 mg/dL (ref >39)
LDL Particle Number: 389 nmol/L (ref <1000)
LDL Size: 20.2 nm — ABNORMAL LOW (ref >20.5)
LDL-C (NIH Calc): 46 mg/dL (ref 0–99)
LP-IR Score: 25 (ref <=45)
Small LDL Particle Number: 174 nmol/L (ref <=527)
Triglycerides: 36 mg/dL (ref 0–149)

## 2022-12-02 ENCOUNTER — Encounter: Payer: Self-pay | Admitting: Internal Medicine

## 2022-12-02 ENCOUNTER — Ambulatory Visit: Payer: Medicare Other | Attending: Internal Medicine | Admitting: Internal Medicine

## 2022-12-02 VITALS — BP 138/68 | HR 72 | Ht <= 58 in | Wt 125.0 lb

## 2022-12-02 DIAGNOSIS — I6521 Occlusion and stenosis of right carotid artery: Secondary | ICD-10-CM | POA: Insufficient documentation

## 2022-12-02 DIAGNOSIS — Z01810 Encounter for preprocedural cardiovascular examination: Secondary | ICD-10-CM | POA: Insufficient documentation

## 2022-12-02 DIAGNOSIS — T466X5D Adverse effect of antihyperlipidemic and antiarteriosclerotic drugs, subsequent encounter: Secondary | ICD-10-CM

## 2022-12-02 DIAGNOSIS — T466X5A Adverse effect of antihyperlipidemic and antiarteriosclerotic drugs, initial encounter: Secondary | ICD-10-CM | POA: Insufficient documentation

## 2022-12-02 DIAGNOSIS — M791 Myalgia, unspecified site: Secondary | ICD-10-CM | POA: Insufficient documentation

## 2022-12-02 DIAGNOSIS — E785 Hyperlipidemia, unspecified: Secondary | ICD-10-CM | POA: Insufficient documentation

## 2022-12-02 NOTE — Progress Notes (Signed)
LIPID CLINIC CONSULT NOTE  Chief Complaint:  Follow-up dyslipidemia  Primary Care Physician: Amber Heys, MD  Primary Cardiologist:  None  HPI:  Amber Barry is a 82 y.o. female who is being seen today for the evaluation of anemia at the request of Amber Heys, MD.  This is a pleasant 82 year old female kindly referred for evaluation and management of dyslipidemia.  She has a history of carotid artery disease with prior right carotid intervention by Dr. Hart Barry in the past.  She is currently followed by Dr. Randie Barry and recently has had some worsening stenosis of the right carotid.  She was in the severe range based on Doppler velocities back in August however plan was to repeat Dopplers in 6 months per her request.  Unfortunately recently she had a spike in blood pressure as well as lightheadedness and presented to the emergency department a few days ago for evaluation.  She underwent CT angiography of the neck which showed about 80% right carotid artery stenosis and then ultimately had an MRI of the head which showed no evidence of acute stroke.  Etiology of her elevated blood pressure was not clear.  She had no chest pain or shortness of breath.  She says she has some anxiety.  She has not been on any lipid-lowering therapy for quite some time after having failed atorvastatin and pravastatin in the past.  Her last lipid profile in December showed total cholesterol 138, triglycerides 81, HDL 40 and LDL 82.  12/02/2022  Amber Barry returns today for follow-up of dyslipidemia.  She has done well on low-dose rosuvastatin.  She seems to be tolerating this well.  Her LDL particle number was 389, LDL-C 46, HDL 54 and triglycerides 36.  LP(a) was tested and negative at 14.2 nmol/L.  She tells me today that her vascular surgeon is considering a second operation on her carotid artery because there is now 90% stenosis.  She has some concerns about the procedure due to the increased risk of stroke.   An EKG was performed today as part of a preoperative workup which shows normal sinus rhythm and a right bundle branch block.  This is a new finding since 2023 comparator but does not necessarily indicate any ischemia.  She denies any anginal symptoms.  PMHx:   Past Medical History:  Diagnosis Date   Anemia    Anxiety    Arthritis    Osteoarthritis   Bursitis    left hip-had injection 2015   Carotid artery occlusion April 2014   Left Bruit, 80% blockage   CKD (chronic kidney disease)    Stage III   Diverticulitis    Hematuria    Hypertension    Interstitial cystitis    Lower back pain June 2015   Ocular migraine    Osteopenia    PONV (postoperative nausea and vomiting)     Past Surgical History:  Procedure Laterality Date   ENDARTERECTOMY Right 12/22/2012   Procedure: Right Carotid Endarterectomy with hemashield patch angioplasty;  Surgeon: Amber Ochoa, MD;  Location: [facility] OR;  Service: Vascular;  Laterality: Right;   TONSILLECTOMY  1947    FAMHx:  Family History  Problem Relation Age of Onset   COPD Mother        Respiratory Disease   Rheumatic fever Mother    Pulmonary disease Mother    Heart attack Mother    Stroke Father    Hypertension Father    Diabetes Paternal Aunt    Prostate  cancer Maternal Grandfather    Hypertension Paternal Aunt    Hypertension Paternal Grandmother    Other Maternal Grandmother        hardening arteries   Stroke Other        paternal side    SOCHx:   reports that she has never smoked. She has never been exposed to tobacco smoke. She has never used smokeless tobacco. She reports that she does not drink alcohol and does not use drugs.  ALLERGIES:  Allergies  Allergen Reactions   Benicar Hct [Olmesartan Medoxomil-Hctz] Swelling    Facial swelling   Buspar [Buspirone] Other (See Comments)    Facial Dysesthesia   Norvasc [Amlodipine Besylate] Swelling    legs   Zoloft [Sertraline Hcl] Anxiety    Increased anxiety, dysesthesia    Lipitor [Atorvastatin] Other (See Comments)    Generalized muscle aches   Lisinopril Swelling    Around eyes   Motrin [Ibuprofen] Other (See Comments)     Contraindicated with Celebrex. Pt states MD said to not take together.   Amitriptyline Hcl Anxiety    And Paresthesias   Pravastatin Other (See Comments)    Pt. Wanted to sleep, no energy.    ROS: Pertinent items noted in HPI and remainder of comprehensive ROS otherwise negative.  HOME MEDS: Current Outpatient Medications on File Prior to Visit  Medication Sig Dispense Refill   acetaminophen (TYLENOL) 500 MG tablet Take 500 mg by mouth every 6 (six) hours as needed.     ALPRAZolam (XANAX) 0.25 MG tablet Take 0.125 mg by mouth 2 (two) times daily as needed for anxiety.      aspirin EC 81 MG tablet Take 243 mg by mouth daily. Pt  Takes x's 3  81 mg tabs per day     azelastine (ASTELIN) 0.1 % nasal spray Place 2 sprays into both nostrils 2 (two) times daily as needed for rhinitis or allergies. As needed  12   Calcium-Vitamin D (CALTRATE 600 PLUS-VIT D PO) Take 1 tablet by mouth daily at 12 noon.     Celecoxib (CELEBREX PO) Take 200 mg by mouth daily as needed (pain). Not more than 2 per week     Cholecalciferol (VITAMIN D) 2000 UNITS CAPS Take 2,000 Units by mouth daily at 12 noon.      escitalopram (LEXAPRO) 20 MG tablet Take 20 mg by mouth daily.     hydrochlorothiazide (HYDRODIURIL) 25 MG tablet Take 25 mg by mouth daily.   3   moexipril (UNIVASC) 15 MG tablet Take 15 mg by mouth daily.     Probiotic Product (ALIGN) 4 MG CAPS Take 4 mg by mouth daily.     Pseudoephedrine HCl (SUDAFED PO) Take 30 mg by mouth as needed (allergies).      rosuvastatin (CRESTOR) 5 MG tablet Take 1 tablet (5 mg total) by mouth daily. 90 tablet 3   No current facility-administered medications on file prior to visit.    LABS/IMAGING: No results found for this or any previous visit (from the past 48 hour(s)). No results found.  LIPID PANEL: No  results found for: "CHOL", "TRIG", "HDL", "CHOLHDL", "VLDL", "LDLCALC", "LDLDIRECT"  WEIGHTS: Wt Readings from Last 3 Encounters:  12/02/22 125 lb (56.7 kg)  11/19/22 125 lb (56.7 kg)  10/09/22 124 lb 6.4 oz (56.4 kg)    VITALS: BP 138/68   Pulse 72   Ht  (1.473 m)   Wt 125 lb (56.7 kg)   LMP 08/11/2002   SpO2  96%   BMI 26.13 kg/m   EXAM: General appearance: alert and no distress Neck: no carotid bruit, no JVD, and thyroid not enlarged, symmetric, no tenderness/mass/nodules Lungs: clear to auscultation bilaterally Heart: regular rate and rhythm, S1, S2 normal, no murmur, click, rub or gallop Abdomen: soft, non-tender; bowel sounds normal; no masses,  no organomegaly Extremities: extremities normal, atraumatic, no cyanosis or edema Pulses: 2+ and symmetric Skin: Skin color, texture, turgor normal. No rashes or lesions Neurologic: Grossly normal Psych: Pleasant  EKG: Sinus rhythm at 72, RBBB-personally reviewed  ASSESSMENT: Intermediate risk for upcoming carotid endarterectomy Mixed dyslipidemia, goal LDL less than 70 History of statin intolerance-myalgias, fatigue Severe right internal carotid artery stenosis with prior right carotid endarterectomy RBBB  PLAN: 1.   Ms. Goering has had significant reduction in her cholesterol with LDL now at 46.  LP(a) was negative.  She is tolerating low-dose rosuvastatin.  She is at acceptable risk for upcoming carotid endarterectomy.  EKG today sinus rhythm although she does have a right bundle branch block which is a new finding.  No anginal symptoms.  Plan follow-up with repeat lipids in 6 months or sooner as necessary.  Chrystie Nose, MD, [facility], FACP    [facility]  Medical Director of the Advanced Lipid Disorders &  Cardiovascular Risk Reduction Clinic Diplomate of the American Board of Clinical Lipidology Attending Cardiologist  Direct Dial: [phone]  Fax: [phone]  Website:   [email or web address] 12/02/2022, 1:57 PM

## 2022-12-02 NOTE — Patient Instructions (Signed)
Medication Instructions:  Your physician recommends that you continue on your current medications as directed. Please refer to the Current Medication list given to you today.  *If you need a refill on your cardiac medications before your next appointment, please call your pharmacy*   Lab Work: Please return for FASTING labs in 6 months (NMR lipoprofile)  Our in office lab hours are Monday-Friday 8:00-4:00, closed for lunch 1-2 pm.  No appointment needed.  [facility] locations:   [facility] - [address] - [address] ([facility]) - [address] [phone] [address]   [place] - [address]    [place]  - [address]    [place] - [address] - [address] [employer]  - [address] [place] [place] - [address] ([facility])  Follow-Up: At [facility], you and your health needs are our priority.  As part of our continuing mission to provide you with exceptional heart care, we have created designated Provider Care Teams.  These Care Teams include your primary Cardiologist (physician) and Advanced Practice Providers (APPs -  Physician Assistants and Nurse Practitioners) who all work together to provide you with the care you need, when you need it.  We recommend signing up for the patient portal called "MyChart".  Sign up information is provided on this After Visit Summary.  MyChart is used to connect with patients for Virtual Visits (Telemedicine).  Patients are able to view lab/test results, encounter notes, upcoming appointments, etc.  Non-urgent messages can be sent to your provider as well.   To learn more about what you can do with MyChart, go to [email or web address].    Your next appointment:   6 month(s)  Provider:   Dr. Rennis Golden

## 2022-12-03 ENCOUNTER — Ambulatory Visit (INDEPENDENT_AMBULATORY_CARE_PROVIDER_SITE_OTHER): Payer: Medicare Other | Admitting: Vascular Surgery

## 2022-12-03 DIAGNOSIS — I6521 Occlusion and stenosis of right carotid artery: Secondary | ICD-10-CM

## 2022-12-03 NOTE — Progress Notes (Signed)
       Virtual Visit via Telephone Note   I connected with Amber Barry on 12/03/2022 using the [email or web address] by telephone and verified that I was speaking with the correct person using two identifiers. Patient was located at home accompanied by her daughter and I am in the office   The limitations of evaluation and management by telemedicine and the availability of in person appointments have been previously discussed with the patient and are documented in the patients chart. The patient expressed understanding and consented to proceed.   Chief Complaint: Right Common carotid stenosis  History of Present Illness: Amber Barry is a 82 y.o. female with history of right carotid endarterectomy for asymptomatic high-grade stenosis.  She has subsequently been followed for high-grade recurrent common carotid stenosis on the right.  She was recently evaluated we discussed proceeding with carotid endarterectomy as she does not appear to be a suitable stent candidate and we are now having a phone visit to answer her questions.  Past Medical History:  Diagnosis Date   Anemia    Anxiety    Arthritis    Osteoarthritis   Bursitis    left hip-had injection 2015   Carotid artery occlusion April 2014   Left Bruit, 80% blockage   CKD (chronic kidney disease)    Stage III   Diverticulitis    Hematuria    Hypertension    Interstitial cystitis    Lower back pain June 2015   Ocular migraine    Osteopenia    PONV (postoperative nausea and vomiting)     Past Surgical History:  Procedure Laterality Date   ENDARTERECTOMY Right 12/22/2012   Procedure: Right Carotid Endarterectomy with hemashield patch angioplasty;  Surgeon: Pryor Ochoa, MD;  Location: [facility] OR;  Service: Vascular;  Laterality: Right;   TONSILLECTOMY  1947    No outpatient medications have been marked as taking for the 12/03/22 encounter (Appointment) with Maeola Harman, MD.    12 system ROS was negative unless otherwise  noted in HPI   Observations/Objective: Patient has good understanding of our conversation today    Assessment and Plan: 82 year old female with recurrent high-grade right common carotid artery stenosis appears to be at the site of previous clamp or the proximal patch.  We again discussed the risk benefits alternatives including the risk of stroke, and nerve injury including increased pain over her initial procedure.  Her options are medical therapy I do not think she is a candidate for either transcarotid or transfemoral stenting due to size mismatch and difficult access of the proximal common carotid artery and she demonstrates good understanding with her daughter.  She is being scheduled for common carotid endarterectomy on June 4 of this year after her daughter's travel.  All of her questions were answered.    I spent 21 minutes with the patient via telephone encounter and in evaluating imaging and previous discussions.   Signed, Lemar Livings [facility] Office: [phone]  12/03/2022, 11:02 AM

## 2022-12-04 ENCOUNTER — Other Ambulatory Visit: Payer: Self-pay

## 2022-12-04 DIAGNOSIS — I6521 Occlusion and stenosis of right carotid artery: Secondary | ICD-10-CM

## 2023-01-02 NOTE — Progress Notes (Signed)
Surgical Instructions    Your procedure is scheduled on Tuesday January 13, 2023.  Report to [facility] Main Entrance "A" at 5:30 A.M., then check in with the Admitting office.  Call this number if you have problems the morning of surgery:  [phone]   If you have any questions prior to your surgery date call [phone]: Open Monday-Friday 8am-4pm If you experience any cold or flu symptoms such as cough, fever, chills, shortness of breath, etc. between now and your scheduled surgery, please notify us at the above number     Remember:  Do not eat or drink after midnight the night before your surgery    Take these medicines the morning of surgery with A SIP OF WATER:  acetaminophen (TYLENOL)  escitalopram (LEXAPRO)  rosuvastatin (CRESTOR)   If needed:  ALPRAZolam (XANAX)  azelastine (ASTELIN)   Follow your surgeon's instructions on when to stop Aspirin.  If no instructions were given by your surgeon then you will need to call the office to get those instructions.    As of today, STOP taking any Aspirin (unless otherwise instructed by your surgeon) Aleve, Naproxen, Ibuprofen, Motrin, Advil, Goody's, BC's, all herbal medications, fish oil, and all vitamins.  Special instructions:    Oral Hygiene is also important to reduce your risk of infection.  Remember - BRUSH YOUR TEETH THE MORNING OF SURGERY WITH YOUR REGULAR TOOTHPASTE   [place]- Preparing For Surgery  Before surgery, you can play an important role. Because skin is not sterile, your skin needs to be as free of germs as possible. You can reduce the number of germs on your skin by washing with CHG (chlorahexidine gluconate) Soap before surgery.  CHG is an antiseptic cleaner which kills germs and bonds with the skin to continue killing germs even after washing.     Please do not use if you have an allergy to CHG or antibacterial soaps. If your skin becomes reddened/irritated stop using the CHG.  Do not shave (including  legs and underarms) for at least 48 hours prior to first CHG shower. It is OK to shave your face.  Please follow these instructions carefully.     Shower the NIGHT BEFORE SURGERY and the MORNING OF SURGERY with CHG Soap.   If you chose to wash your hair, wash your hair first as usual with your normal shampoo. After you shampoo, rinse your hair and body thoroughly to remove the shampoo.  Then [employer] and genitals (private parts) with your normal soap and rinse thoroughly to remove soap.  After that Use CHG Soap as you would any other liquid soap. You can apply CHG directly to the skin and wash gently with a scrungie or a clean washcloth.   Apply the CHG Soap to your body ONLY FROM THE NECK DOWN.  Do not use on open wounds or open sores. Avoid contact with your eyes, ears, mouth and genitals (private parts). Wash Face and genitals (private parts)  with your normal soap.   Wash thoroughly, paying special attention to the area where your surgery will be performed.  Thoroughly rinse your body with warm water from the neck down.  DO NOT shower/wash with your normal soap after using and rinsing off the CHG Soap.  Pat yourself dry with a CLEAN TOWEL.  Wear CLEAN PAJAMAS to bed the night before surgery  Place CLEAN SHEETS on your bed the night before your surgery  DO NOT SLEEP WITH PETS.   Day of Surgery:  Take a shower with CHG soap. Wear Clean/Comfortable clothing the morning of surgery Do not apply any deodorants/lotions.   Remember to brush your teeth WITH YOUR REGULAR TOOTHPASTE.  Do not wear jewelry or makeup. Do not wear lotions, powders, perfumes/cologne or deodorant. Do not shave 48 hours prior to surgery.  Men may shave face and neck. Do not bring valuables to the hospital. Do not wear nail polish, gel polish, artificial nails, or any other type of covering on natural nails (fingers and toes) If you have artificial nails or gel coating that need to be removed by a nail  salon, please have this removed prior to surgery. Artificial nails or gel coating may interfere with anesthesia's ability to adequately monitor your vital signs.  [facility] is not responsible for any belongings or valuables.    Do NOT Smoke (Tobacco/Vaping)  24 hours prior to your procedure  If you use a CPAP at night, you may bring your mask for your overnight stay.   Contacts, glasses, hearing aids, dentures or partials may not be worn into surgery, please bring cases for these belongings   For patients admitted to the hospital, discharge time will be determined by your treatment team.   Patients discharged the day of surgery will not be allowed to drive home, and someone needs to stay with them for 24 hours.   SURGICAL WAITING ROOM VISITATION Patients having surgery or a procedure may have no more than 2 support people in the waiting area - these visitors may rotate.   Children under the age of 78 must have an adult with them who is not the patient. If the patient needs to stay at the hospital during part of their recovery, the visitor guidelines for inpatient rooms apply. Pre-op nurse will coordinate an appropriate time for 1 support person to accompany patient in pre-op.  This support person may not rotate.   Please refer to [email or web address] for the visitor guidelines for Inpatients (after your surgery is over and you are in a regular room).   If you received a COVID test during your pre-op visit, it is requested that you wear a mask when out in public, stay away from anyone that may not be feeling well, and notify your surgeon if you develop symptoms. If you have been in contact with anyone that has tested positive in the last 10 days, please notify your surgeon.    Please read over the following fact sheets that you were given.

## 2023-01-06 ENCOUNTER — Other Ambulatory Visit: Payer: Self-pay

## 2023-01-06 ENCOUNTER — Encounter (HOSPITAL_COMMUNITY)
Admission: RE | Admit: 2023-01-06 | Discharge: 2023-01-06 | Disposition: A | Discharge status: Home / Self Care | Payer: Medicare Other | Source: Ambulatory Visit | Attending: Vascular Surgery | Admitting: Vascular Surgery

## 2023-01-06 ENCOUNTER — Encounter (HOSPITAL_COMMUNITY): Payer: Self-pay

## 2023-01-06 VITALS — BP 165/67 | HR 86 | Temp 98.3°F | Resp 16 | Ht <= 58 in | Wt 124.6 lb

## 2023-01-06 DIAGNOSIS — I6521 Occlusion and stenosis of right carotid artery: Secondary | ICD-10-CM | POA: Diagnosis not present

## 2023-01-06 DIAGNOSIS — F419 Anxiety disorder, unspecified: Secondary | ICD-10-CM | POA: Insufficient documentation

## 2023-01-06 DIAGNOSIS — E785 Hyperlipidemia, unspecified: Secondary | ICD-10-CM | POA: Diagnosis not present

## 2023-01-06 DIAGNOSIS — N183 Chronic kidney disease, stage 3 unspecified: Secondary | ICD-10-CM | POA: Diagnosis not present

## 2023-01-06 DIAGNOSIS — I10 Essential (primary) hypertension: Secondary | ICD-10-CM | POA: Diagnosis not present

## 2023-01-06 DIAGNOSIS — I129 Hypertensive chronic kidney disease with stage 1 through stage 4 chronic kidney disease, or unspecified chronic kidney disease: Secondary | ICD-10-CM | POA: Insufficient documentation

## 2023-01-06 DIAGNOSIS — Z01818 Encounter for other preprocedural examination: Secondary | ICD-10-CM | POA: Insufficient documentation

## 2023-01-06 LAB — COMPREHENSIVE METABOLIC PANEL
ALT: 15 U/L (ref 0–44)
AST: 22 U/L (ref 15–41)
Albumin: 4.1 g/dL (ref 3.5–5.0)
Alkaline Phosphatase: 48 U/L (ref 38–126)
Anion gap: 6 (ref 5–15)
BUN: 25 mg/dL — ABNORMAL HIGH (ref 8–23)
CO2: 26 mmol/L (ref 22–32)
Calcium: 9.4 mg/dL (ref 8.9–10.3)
Chloride: 103 mmol/L (ref 98–111)
Creatinine, Ser: 1.08 mg/dL — ABNORMAL HIGH (ref 0.44–1.00)
GFR, Estimated: 51 mL/min — ABNORMAL LOW (ref >60)
Glucose, Bld: 117 mg/dL — ABNORMAL HIGH (ref 70–99)
Potassium: 4.3 mmol/L (ref 3.5–5.1)
Sodium: 135 mmol/L (ref 135–145)
Total Bilirubin: 0.6 mg/dL (ref 0.3–1.2)
Total Protein: 7.3 g/dL (ref 6.5–8.1)

## 2023-01-06 LAB — CBC
HCT: 37.4 % (ref 36.0–46.0)
Hemoglobin: 11.8 g/dL — ABNORMAL LOW (ref 12.0–15.0)
MCH: 29.3 pg (ref 26.0–34.0)
MCHC: 31.6 g/dL (ref 30.0–36.0)
MCV: 92.8 fL (ref 80.0–100.0)
Platelets: 190 10*3/uL (ref 150–400)
RBC: 4.03 MIL/uL (ref 3.87–5.11)
RDW: 12.7 % (ref 11.5–15.5)
WBC: 8.2 10*3/uL (ref 4.0–10.5)
nRBC: 0 % (ref 0.0–0.2)

## 2023-01-06 LAB — TYPE AND SCREEN
ABO/RH(D): A POS
Antibody Screen: NEGATIVE

## 2023-01-06 LAB — URINALYSIS, ROUTINE W REFLEX MICROSCOPIC
Bacteria, UA: NONE SEEN
Bilirubin Urine: NEGATIVE
Glucose, UA: NEGATIVE mg/dL
Hgb urine dipstick: NEGATIVE
Ketones, ur: NEGATIVE mg/dL
Nitrite: NEGATIVE
Protein, ur: NEGATIVE mg/dL
Specific Gravity, Urine: 1.019 (ref 1.005–1.030)
pH: 5 (ref 5.0–8.0)

## 2023-01-06 LAB — APTT: aPTT: 31 seconds (ref 24–36)

## 2023-01-06 LAB — PROTIME-INR
INR: 1.1 (ref 0.8–1.2)
Prothrombin Time: 14.4 seconds (ref 11.4–15.2)

## 2023-01-06 LAB — SURGICAL PCR SCREEN
MRSA, PCR: NEGATIVE
Staphylococcus aureus: NEGATIVE

## 2023-01-06 NOTE — Progress Notes (Addendum)
PCP - Blair Heys, MD  Cardiologist - denies  PPM/ICD - denies Device Orders - n/a Rep Notified - n/a  Chest x-ray - denies EKG - 12/02/2022 Stress Test - denies ECHO - denies Cardiac Cath - denies  Sleep Study - denies CPAP - n/a  Fasting Blood Sugar - no DM   Last dose of GLP1 agonist-  n/a GLP1 instructions: n/a  Blood Thinner Instructions: n/a Aspirin Instructions: follow your surgeon's instructions on when to stop Aspirin.   ERAS Protcol - no; NPO PRE-SURGERY Ensure or G2- no  COVID TEST- no   Anesthesia review: yes Pt's BP is elevated today 171/75 and 165/67. Per pt it's due to anxiety. [name], PA is aware. Pt is advised to monitor her BP at home and if it's more than 160 syst - contact her PCP.   Patient denies shortness of breath, fever, cough and chest pain at PAT appointment and the last 2 months.    All instructions explained to the patient, with a verbal understanding of the material. Patient agrees to go over the instructions while at home for a better understanding. Patient also instructed to self quarantine after being tested for COVID-19. The opportunity to ask questions was provided.

## 2023-01-07 ENCOUNTER — Encounter (HOSPITAL_COMMUNITY): Payer: Self-pay

## 2023-01-07 NOTE — Progress Notes (Signed)
PCP - Blair Heys, MD  Cardiologist - denies  PPM/ICD - denies Device Orders - n/a Rep Notified - n/a  Chest x-ray - denies EKG - 12/02/2022 Stress Test - denies ECHO - denies Cardiac Cath - denies  Sleep Study - denies CPAP - n/a  Fasting Blood Sugar - no DM   Last dose of GLP1 agonist-  n/a GLP1 instructions: n/a  Blood Thinner Instructions: n/a Aspirin Instructions: follow your surgeon's instructions on when to stop Aspirin.   ERAS Protcol - no; NPO PRE-SURGERY Ensure or G2- no  COVID TEST- no   Anesthesia review: yes Pt's BP is elevated today 171/75 and 165/67. Per pt it's due to anxiety. [name], PA is aware. Pt is advised to monitor her BP at home and if it's more than 160 syst - contact her PCP. Pt's UA shows leukocytes. OR scheduler is notified.   Patient denies shortness of breath, fever, cough and chest pain at PAT appointment and the last 2 months.    All instructions explained to the patient, with a verbal understanding of the material. Patient agrees to go over the instructions while at home for a better understanding. Patient also instructed to self quarantine after being tested for COVID-19. The opportunity to ask questions was provided.

## 2023-01-07 NOTE — Anesthesia Preprocedure Evaluation (Addendum)
Anesthesia Evaluation  Patient identified by MRN, date of birth, ID band Patient awake    Reviewed: Allergy & Precautions, NPO status , Patient's Chart, lab work & pertinent test results  History of Anesthesia Complications (+) PONV and history of anesthetic complications  Airway Mallampati: III  TM Distance: >3 FB Neck ROM: Full    Dental  (+) Dental Advisory Given, Chipped,    Pulmonary neg pulmonary ROS   Pulmonary exam normal breath sounds clear to auscultation       Cardiovascular hypertension, Pt. on medications Normal cardiovascular exam Rhythm:Regular Rate:Normal  US Carotid Duplex 10/09/22: Summary:  Right Carotid: Velocities in the right ICA are consistent with a 1-39% stenosis. The ECA appears >50% stenosed.   Left Carotid: Velocities in the left ICA are consistent with a 1-39% stenosis.   Vertebrals: Bilateral vertebral arteries demonstrate antegrade flow.  Subclavians: Normal flow hemodynamics were seen in bilateral subclavian arterie    Neuro/Psych  Headaches PSYCHIATRIC DISORDERS Anxiety        GI/Hepatic negative GI ROS, Neg liver ROS,,,  Endo/Other  negative endocrine ROS    Renal/GU Renal InsufficiencyRenal diseaseLab Results      Component                Value               Date                      CREATININE               1.08 (H)            01/06/2023                BUN                      25 (H)              01/06/2023                NA                       135                 01/06/2023                K                        4.3                 01/06/2023                CL                       103                 01/06/2023                CO2                      26                  01/06/2023             negative genitourinary   Musculoskeletal  (+) Arthritis ,    Abdominal   Peds  Hematology negative hematology ROS (+)   Anesthesia Other Findings  Reproductive/Obstetrics                              Anesthesia Physical Anesthesia Plan  ASA: 3  Anesthesia Plan: General   Post-op Pain Management: Tylenol PO (pre-op)*   Induction: Intravenous  PONV Risk Score and Plan: 4 or greater and Dexamethasone, Ondansetron and Treatment may vary due to age or medical condition  Airway Management Planned: Oral ETT and Video Laryngoscope Planned  Additional Equipment: Arterial line  Intra-op Plan:   Post-operative Plan: Extubation in OR  Informed Consent: I have reviewed the patients History and Physical, chart, labs and discussed the procedure including the risks, benefits and alternatives for the proposed anesthesia with the patient or authorized representative who has indicated his/her understanding and acceptance.     Dental advisory given  Plan Discussed with: CRNA  Anesthesia Plan Comments: (Remifentanil infusion )        Anesthesia Quick Evaluation

## 2023-01-07 NOTE — Progress Notes (Signed)
Case: [id number] Date/Time: 01/13/23 0715   Procedure: RIGHT ENDARTERECTOMY COMMON CAROTID (Right)   Anesthesia type: Choice   Pre-op diagnosis: Asymptomatic Right Carotid Stenosis   [facility] ROOM [facility] / [facility]   Surgeons: Maeola Harman, MD       DISCUSSION: Amber Barry is a 82 year old female who presents to PAT prior to surgery listed above.  Patient with history of carotid artery disease s/p prior CEA now complicated by restenosis.   Other past medical history includes hypertension, anxiety, hyperlipidemia, CKD.  Patient follows with cardiology for management of dyslipidemia and intolerance of statins.  She does not have any cardiac history.  Of note her blood pressure was elevated at her PAT visit.  She was seen in the emergency department in January of this year due to elevated blood pressure and was ruled out for stroke.  She was advised to continue to monitor her blood pressure over the next several days and if persistently high she needs to let her PCP know to potentially have her medications adjusted.  VS: BP (!) 165/67   Pulse 86   Temp 36.8 C   Resp 16   Ht 4\' 10"  (1.473 m)   Wt 56.5 kg   LMP 08/11/2002   SpO2 99%   BMI 26.04 kg/m      01/06/2023    2:55 PM 01/06/2023    1:57 PM 12/02/2022    1:21 PM  Vitals with BMI  Height  4\' 10"  4\' 10"   Weight  124 lbs 10 oz 125 lbs  BMI  26.05 26.13  Systolic 165 171 454  Diastolic 67 75 68  Pulse  86 72    PROVIDERS: Blair Heys, MD Cardiology: Dr. Rennis Golden  LABS: Labs reviewed: Acceptable for surgery. (all labs ordered are listed, but only abnormal results are displayed)  Labs Reviewed  CBC - Abnormal; Notable for the following components:      Result Value   Hemoglobin 11.8 (*)    All other components within normal limits  COMPREHENSIVE METABOLIC PANEL - Abnormal; Notable for the following components:   Glucose, Bld 117 (*)    BUN 25 (*)    Creatinine, Ser 1.08 (*)    GFR, Estimated 51 (*)     All other components within normal limits  URINALYSIS, ROUTINE W REFLEX MICROSCOPIC - Abnormal; Notable for the following components:   Leukocytes,Ua SMALL (*)    All other components within normal limits  SURGICAL PCR SCREEN  PROTIME-INR  APTT  TYPE AND SCREEN     IMAGES:  MRI Brain 08/29/22:  IMPRESSION: No acute intracranial process. No evidence of acute or subacute infarct.  EKG 12/02/22:  NSR  RBBB  Per Dr. Rennis Golden "An EKG was performed today as part of a preoperative workup which shows normal sinus rhythm and a right bundle branch block. This is a new finding since 2023 comparator but does not necessarily indicate any ischemia. "  CV:  US Carotid Duplex 10/09/22: Summary:  Right Carotid: Velocities in the right ICA are consistent with a 1-39% stenosis. The ECA appears >50% stenosed.   Left Carotid: Velocities in the left ICA are consistent with a 1-39% stenosis.   Vertebrals: Bilateral vertebral arteries demonstrate antegrade flow.  Subclavians: Normal flow hemodynamics were seen in bilateral subclavian arteries.   Past Medical History:  Diagnosis Date   Anemia    pt denies   Anxiety    Arthritis    Osteoarthritis   Bursitis  left hip-had injection 2015   Carotid artery occlusion 11/2012   Left Bruit, 80% blockage   CKD (chronic kidney disease)    Stage III   Diverticulitis    Hematuria    Hypertension    Interstitial cystitis    Lower back pain 01/2014   Ocular migraine    Osteopenia    PONV (postoperative nausea and vomiting)     Past Surgical History:  Procedure Laterality Date   ENDARTERECTOMY Right 12/22/2012   Procedure: Right Carotid Endarterectomy with hemashield patch angioplasty;  Surgeon: Pryor Ochoa, MD;  Location: [facility] OR;  Service: Vascular;  Laterality: Right;   TONSILLECTOMY  1947    MEDICATIONS:  acetaminophen (TYLENOL) 500 MG tablet   ALPRAZolam (XANAX) 0.25 MG tablet   aspirin EC 81 MG tablet   azelastine (ASTELIN) 0.1 %  nasal spray   Calcium-Vitamin D (CALTRATE 600 PLUS-VIT D PO)   celecoxib (CELEBREX) 200 MG capsule   cholecalciferol (VITAMIN D3) 25 MCG (1000 UNIT) tablet   escitalopram (LEXAPRO) 20 MG tablet   hydrochlorothiazide (HYDRODIURIL) 25 MG tablet   moexipril (UNIVASC) 15 MG tablet   Probiotic Product (ALIGN) 4 MG CAPS   Pseudoephedrine HCl (SUDAFED PO)   rosuvastatin (CRESTOR) 5 MG tablet   No current facility-administered medications for this encounter.   [name] [facility] Surgical Short Stay/Anesthesiology [facility] Phone [phone] 01/07/2023 2:02 PM

## 2023-01-13 ENCOUNTER — Encounter (HOSPITAL_COMMUNITY): Payer: Self-pay | Admitting: Vascular Surgery

## 2023-01-13 ENCOUNTER — Inpatient Hospital Stay (HOSPITAL_COMMUNITY): Payer: Medicare Other | Admitting: Medical

## 2023-01-13 ENCOUNTER — Other Ambulatory Visit: Payer: Self-pay

## 2023-01-13 ENCOUNTER — Inpatient Hospital Stay (HOSPITAL_COMMUNITY)
Admission: RE | Admit: 2023-01-13 | Discharge: 2023-01-14 | DRG: 039 | Disposition: A | Discharge status: Home Health | Payer: Medicare Other | Source: Ambulatory Visit | Attending: Vascular Surgery | Admitting: Vascular Surgery

## 2023-01-13 ENCOUNTER — Inpatient Hospital Stay (HOSPITAL_COMMUNITY): Payer: Medicare Other

## 2023-01-13 ENCOUNTER — Encounter (HOSPITAL_COMMUNITY)
Admission: RE | Disposition: A | Discharge status: Home Health | Payer: Self-pay | Source: Ambulatory Visit | Attending: Vascular Surgery

## 2023-01-13 DIAGNOSIS — Z79899 Other long term (current) drug therapy: Secondary | ICD-10-CM

## 2023-01-13 DIAGNOSIS — N183 Chronic kidney disease, stage 3 unspecified: Secondary | ICD-10-CM | POA: Diagnosis present

## 2023-01-13 DIAGNOSIS — Z7982 Long term (current) use of aspirin: Secondary | ICD-10-CM

## 2023-01-13 DIAGNOSIS — Z825 Family history of asthma and other chronic lower respiratory diseases: Secondary | ICD-10-CM

## 2023-01-13 DIAGNOSIS — I10 Essential (primary) hypertension: Secondary | ICD-10-CM

## 2023-01-13 DIAGNOSIS — Z833 Family history of diabetes mellitus: Secondary | ICD-10-CM

## 2023-01-13 DIAGNOSIS — Z888 Allergy status to other drugs, medicaments and biological substances status: Secondary | ICD-10-CM

## 2023-01-13 DIAGNOSIS — Z8249 Family history of ischemic heart disease and other diseases of the circulatory system: Secondary | ICD-10-CM

## 2023-01-13 DIAGNOSIS — Z823 Family history of stroke: Secondary | ICD-10-CM | POA: Diagnosis not present

## 2023-01-13 DIAGNOSIS — I6521 Occlusion and stenosis of right carotid artery: Principal | ICD-10-CM

## 2023-01-13 DIAGNOSIS — F419 Anxiety disorder, unspecified: Secondary | ICD-10-CM | POA: Diagnosis present

## 2023-01-13 DIAGNOSIS — I129 Hypertensive chronic kidney disease with stage 1 through stage 4 chronic kidney disease, or unspecified chronic kidney disease: Secondary | ICD-10-CM | POA: Diagnosis present

## 2023-01-13 DIAGNOSIS — I6529 Occlusion and stenosis of unspecified carotid artery: Principal | ICD-10-CM

## 2023-01-13 HISTORY — PX: PATCH ANGIOPLASTY: SHX6230

## 2023-01-13 HISTORY — PX: ENDARTERECTOMY: SHX5162

## 2023-01-13 LAB — POCT ACTIVATED CLOTTING TIME
Activated Clotting Time: 244 seconds
Activated Clotting Time: 299 seconds

## 2023-01-13 SURGERY — ENDARTERECTOMY, CAROTID
Anesthesia: General | Site: Neck | Laterality: Right

## 2023-01-13 MED ORDER — DOCUSATE SODIUM 100 MG PO CAPS
100.0000 mg | ORAL_CAPSULE | Freq: Every day | ORAL | Status: DC
  Administered 2023-01-14: 100 mg via ORAL
  Filled 2023-01-13: qty 1

## 2023-01-13 MED ORDER — ONDANSETRON HCL 4 MG/2ML IJ SOLN
INTRAMUSCULAR | Status: AC
  Filled 2023-01-13: qty 2

## 2023-01-13 MED ORDER — BISACODYL 10 MG RE SUPP: 10.0000 mg | Freq: Every day | RECTAL | Status: DC | PRN

## 2023-01-13 MED ORDER — ESCITALOPRAM OXALATE 20 MG PO TABS
20.0000 mg | ORAL_TABLET | Freq: Every day | ORAL | Status: DC
  Administered 2023-01-14: 20 mg via ORAL
  Filled 2023-01-13: qty 1

## 2023-01-13 MED ORDER — SODIUM CHLORIDE 0.9 % IV SOLN
0.1500 ug/kg/min | INTRAVENOUS | Status: AC
  Administered 2023-01-13: .1 ug/kg/min via INTRAVENOUS
  Filled 2023-01-13: qty 2000

## 2023-01-13 MED ORDER — ACETAMINOPHEN 325 MG PO TABS
325.0000 mg | ORAL_TABLET | ORAL | Status: DC | PRN
  Administered 2023-01-13: 650 mg via ORAL
  Filled 2023-01-13: qty 2

## 2023-01-13 MED ORDER — ASPIRIN 81 MG PO TBEC
243.0000 mg | DELAYED_RELEASE_TABLET | Freq: Every day | ORAL | Status: DC
  Administered 2023-01-14: 243 mg via ORAL
  Filled 2023-01-13: qty 3

## 2023-01-13 MED ORDER — METOPROLOL TARTRATE 5 MG/5ML IV SOLN: 2.0000 mg | INTRAVENOUS | Status: DC | PRN

## 2023-01-13 MED ORDER — HEPARIN SODIUM (PORCINE) 1000 UNIT/ML IJ SOLN
INTRAMUSCULAR | Status: AC
  Filled 2023-01-13: qty 10

## 2023-01-13 MED ORDER — SODIUM CHLORIDE 0.9 % IV SOLN: INTRAVENOUS | Status: DC

## 2023-01-13 MED ORDER — PROTAMINE SULFATE 10 MG/ML IV SOLN
INTRAVENOUS | Status: DC | PRN
  Administered 2023-01-13: 10 mg via INTRAVENOUS
  Administered 2023-01-13 (×2): 20 mg via INTRAVENOUS

## 2023-01-13 MED ORDER — HEMOSTATIC AGENTS (NO CHARGE) OPTIME
TOPICAL | Status: DC | PRN
  Administered 2023-01-13: 1 via TOPICAL

## 2023-01-13 MED ORDER — PHENYLEPHRINE 80 MCG/ML (10ML) SYRINGE FOR IV PUSH (FOR BLOOD PRESSURE SUPPORT)
PREFILLED_SYRINGE | INTRAVENOUS | Status: AC
  Filled 2023-01-13: qty 10

## 2023-01-13 MED ORDER — FENTANYL CITRATE (PF) 100 MCG/2ML IJ SOLN
INTRAMUSCULAR | Status: AC
  Filled 2023-01-13: qty 2

## 2023-01-13 MED ORDER — PHENYLEPHRINE 80 MCG/ML (10ML) SYRINGE FOR IV PUSH (FOR BLOOD PRESSURE SUPPORT)
PREFILLED_SYRINGE | INTRAVENOUS | Status: DC | PRN
  Administered 2023-01-13: 80 ug via INTRAVENOUS
  Administered 2023-01-13: 120 ug via INTRAVENOUS
  Administered 2023-01-13: 80 ug via INTRAVENOUS

## 2023-01-13 MED ORDER — HYDROCHLOROTHIAZIDE 25 MG PO TABS
25.0000 mg | ORAL_TABLET | Freq: Every day | ORAL | Status: DC
  Administered 2023-01-13 – 2023-01-14 (×2): 25 mg via ORAL
  Filled 2023-01-13 (×2): qty 1

## 2023-01-13 MED ORDER — LACTATED RINGERS IV SOLN: INTRAVENOUS | Status: DC

## 2023-01-13 MED ORDER — ONDANSETRON HCL 4 MG/2ML IJ SOLN
INTRAMUSCULAR | Status: DC | PRN
  Administered 2023-01-13: 4 mg via INTRAVENOUS

## 2023-01-13 MED ORDER — CLEVIDIPINE BUTYRATE 0.5 MG/ML IV EMUL
INTRAVENOUS | Status: DC | PRN
  Administered 2023-01-13: 1 mg/h via INTRAVENOUS

## 2023-01-13 MED ORDER — FENTANYL CITRATE (PF) 250 MCG/5ML IJ SOLN
INTRAMUSCULAR | Status: AC
  Filled 2023-01-13: qty 5

## 2023-01-13 MED ORDER — FENTANYL CITRATE (PF) 250 MCG/5ML IJ SOLN
INTRAMUSCULAR | Status: DC | PRN
  Administered 2023-01-13 (×3): 50 ug via INTRAVENOUS

## 2023-01-13 MED ORDER — LIDOCAINE 2% (20 MG/ML) 5 ML SYRINGE
INTRAMUSCULAR | Status: AC
  Filled 2023-01-13: qty 5

## 2023-01-13 MED ORDER — LABETALOL HCL 5 MG/ML IV SOLN: 10.0000 mg | INTRAVENOUS | Status: DC | PRN

## 2023-01-13 MED ORDER — DEXAMETHASONE SODIUM PHOSPHATE 10 MG/ML IJ SOLN
INTRAMUSCULAR | Status: AC
  Filled 2023-01-13: qty 1

## 2023-01-13 MED ORDER — ONDANSETRON HCL 4 MG/2ML IJ SOLN: 4.0000 mg | Freq: Four times a day (QID) | INTRAMUSCULAR | Status: DC | PRN

## 2023-01-13 MED ORDER — ALPRAZOLAM 0.25 MG PO TABS: 0.1250 mg | ORAL_TABLET | Freq: Two times a day (BID) | ORAL | Status: DC | PRN

## 2023-01-13 MED ORDER — OXYCODONE-ACETAMINOPHEN 5-325 MG PO TABS
1.0000 | ORAL_TABLET | ORAL | Status: DC | PRN
  Administered 2023-01-13: 1 via ORAL
  Filled 2023-01-13: qty 1

## 2023-01-13 MED ORDER — CHLORHEXIDINE GLUCONATE CLOTH 2 % EX PADS: 6.0000 | MEDICATED_PAD | Freq: Once | CUTANEOUS | Status: DC

## 2023-01-13 MED ORDER — ROSUVASTATIN CALCIUM 5 MG PO TABS
5.0000 mg | ORAL_TABLET | Freq: Every day | ORAL | Status: DC
  Administered 2023-01-14: 5 mg via ORAL
  Filled 2023-01-13: qty 1

## 2023-01-13 MED ORDER — PHENYLEPHRINE HCL-NACL 20-0.9 MG/250ML-% IV SOLN
INTRAVENOUS | Status: DC | PRN
  Administered 2023-01-13: 25 ug/min via INTRAVENOUS

## 2023-01-13 MED ORDER — ROCURONIUM BROMIDE 10 MG/ML (PF) SYRINGE
PREFILLED_SYRINGE | INTRAVENOUS | Status: AC
  Filled 2023-01-13: qty 10

## 2023-01-13 MED ORDER — ALIGN 4 MG PO CAPS: 4.0000 mg | ORAL_CAPSULE | Freq: Every day | ORAL | Status: DC

## 2023-01-13 MED ORDER — ORAL CARE MOUTH RINSE: 15.0000 mL | Freq: Once | OROMUCOSAL | Status: AC

## 2023-01-13 MED ORDER — POTASSIUM CHLORIDE CRYS ER 20 MEQ PO TBCR: 20.0000 meq | EXTENDED_RELEASE_TABLET | Freq: Every day | ORAL | Status: DC | PRN

## 2023-01-13 MED ORDER — CEFAZOLIN SODIUM-DEXTROSE 2-4 GM/100ML-% IV SOLN
2.0000 g | Freq: Three times a day (TID) | INTRAVENOUS | Status: AC
  Administered 2023-01-13 (×2): 2 g via INTRAVENOUS
  Filled 2023-01-13 (×2): qty 100

## 2023-01-13 MED ORDER — HYDRALAZINE HCL 20 MG/ML IJ SOLN: 5.0000 mg | INTRAMUSCULAR | Status: DC | PRN

## 2023-01-13 MED ORDER — AZELASTINE HCL 0.1 % NA SOLN: 2.0000 | Freq: Two times a day (BID) | NASAL | Status: DC | PRN

## 2023-01-13 MED ORDER — LIDOCAINE HCL (PF) 1 % IJ SOLN
INTRAMUSCULAR | Status: AC
  Filled 2023-01-13: qty 10

## 2023-01-13 MED ORDER — HEPARIN 6000 UNIT IRRIGATION SOLUTION
Status: DC | PRN
  Administered 2023-01-13: 1

## 2023-01-13 MED ORDER — ACETAMINOPHEN 10 MG/ML IV SOLN
INTRAVENOUS | Status: AC
  Filled 2023-01-13: qty 100

## 2023-01-13 MED ORDER — SODIUM CHLORIDE 0.9 % IV SOLN: 500.0000 mL | Freq: Once | INTRAVENOUS | Status: DC | PRN

## 2023-01-13 MED ORDER — PROPOFOL 10 MG/ML IV BOLUS
INTRAVENOUS | Status: DC | PRN
  Administered 2023-01-13: 40 mg via INTRAVENOUS
  Administered 2023-01-13: 60 mg via INTRAVENOUS

## 2023-01-13 MED ORDER — HEPARIN 6000 UNIT IRRIGATION SOLUTION
Status: AC
  Filled 2023-01-13: qty 500

## 2023-01-13 MED ORDER — PHENOL 1.4 % MT LIQD: 1.0000 | OROMUCOSAL | Status: DC | PRN

## 2023-01-13 MED ORDER — HEPARIN SODIUM (PORCINE) 1000 UNIT/ML IJ SOLN
INTRAMUSCULAR | Status: DC | PRN
  Administered 2023-01-13: 6000 [IU] via INTRAVENOUS

## 2023-01-13 MED ORDER — LIDOCAINE 2% (20 MG/ML) 5 ML SYRINGE
INTRAMUSCULAR | Status: DC | PRN
  Administered 2023-01-13: 40 mg via INTRAVENOUS

## 2023-01-13 MED ORDER — POLYETHYLENE GLYCOL 3350 17 G PO PACK: 17.0000 g | PACK | Freq: Every day | ORAL | Status: DC | PRN

## 2023-01-13 MED ORDER — DEXAMETHASONE SODIUM PHOSPHATE 10 MG/ML IJ SOLN
INTRAMUSCULAR | Status: DC | PRN
  Administered 2023-01-13: 4 mg via INTRAVENOUS

## 2023-01-13 MED ORDER — 0.9 % SODIUM CHLORIDE (POUR BTL) OPTIME
TOPICAL | Status: DC | PRN
  Administered 2023-01-13: 2000 mL

## 2023-01-13 MED ORDER — HYDRALAZINE HCL 20 MG/ML IJ SOLN
INTRAMUSCULAR | Status: DC | PRN
  Administered 2023-01-13: 2 mg via INTRAVENOUS

## 2023-01-13 MED ORDER — HYDRALAZINE HCL 20 MG/ML IJ SOLN
INTRAMUSCULAR | Status: AC
  Filled 2023-01-13: qty 1

## 2023-01-13 MED ORDER — MORPHINE SULFATE (PF) 2 MG/ML IV SOLN: 2.0000 mg | INTRAVENOUS | Status: DC | PRN

## 2023-01-13 MED ORDER — ACETAMINOPHEN 650 MG RE SUPP: 325.0000 mg | RECTAL | Status: DC | PRN

## 2023-01-13 MED ORDER — FENTANYL CITRATE (PF) 100 MCG/2ML IJ SOLN
25.0000 ug | INTRAMUSCULAR | Status: DC | PRN
  Administered 2023-01-13: 25 ug via INTRAVENOUS

## 2023-01-13 MED ORDER — CEFAZOLIN SODIUM-DEXTROSE 2-4 GM/100ML-% IV SOLN
2.0000 g | INTRAVENOUS | Status: AC
  Administered 2023-01-13: 2 g via INTRAVENOUS
  Filled 2023-01-13: qty 100

## 2023-01-13 MED ORDER — PROTAMINE SULFATE 10 MG/ML IV SOLN
INTRAVENOUS | Status: AC
  Filled 2023-01-13: qty 5

## 2023-01-13 MED ORDER — GUAIFENESIN-DM 100-10 MG/5ML PO SYRP: 15.0000 mL | ORAL_SOLUTION | ORAL | Status: DC | PRN

## 2023-01-13 MED ORDER — SUGAMMADEX SODIUM 200 MG/2ML IV SOLN
INTRAVENOUS | Status: DC | PRN
  Administered 2023-01-13: 200 mg via INTRAVENOUS

## 2023-01-13 MED ORDER — TRANDOLAPRIL 1 MG PO TABS
2.0000 mg | ORAL_TABLET | Freq: Every day | ORAL | Status: DC
  Administered 2023-01-13 – 2023-01-14 (×2): 2 mg via ORAL
  Filled 2023-01-13 (×2): qty 2

## 2023-01-13 MED ORDER — MAGNESIUM SULFATE 2 GM/50ML IV SOLN: 2.0000 g | Freq: Every day | INTRAVENOUS | Status: DC | PRN

## 2023-01-13 MED ORDER — PANTOPRAZOLE SODIUM 40 MG PO TBEC
40.0000 mg | DELAYED_RELEASE_TABLET | Freq: Every day | ORAL | Status: DC
  Administered 2023-01-13 – 2023-01-14 (×2): 40 mg via ORAL
  Filled 2023-01-13 (×2): qty 1

## 2023-01-13 MED ORDER — CHLORHEXIDINE GLUCONATE 0.12 % MT SOLN
15.0000 mL | Freq: Once | OROMUCOSAL | Status: AC
  Administered 2023-01-13: 15 mL via OROMUCOSAL
  Filled 2023-01-13: qty 15

## 2023-01-13 MED ORDER — ROCURONIUM BROMIDE 10 MG/ML (PF) SYRINGE
PREFILLED_SYRINGE | INTRAVENOUS | Status: DC | PRN
  Administered 2023-01-13: 50 mg via INTRAVENOUS
  Administered 2023-01-13: 20 mg via INTRAVENOUS

## 2023-01-13 MED ORDER — ALUM & MAG HYDROXIDE-SIMETH 200-200-20 MG/5ML PO SUSP: 15.0000 mL | ORAL | Status: DC | PRN

## 2023-01-13 MED ORDER — PROPOFOL 10 MG/ML IV BOLUS
INTRAVENOUS | Status: AC
  Filled 2023-01-13: qty 20

## 2023-01-13 MED ORDER — ACETAMINOPHEN 500 MG PO TABS: 1000.0000 mg | ORAL_TABLET | Freq: Once | ORAL | Status: DC

## 2023-01-13 SURGICAL SUPPLY — 49 items
ADH SKN CLS APL DERMABOND .7 (GAUZE/BANDAGES/DRESSINGS) ×1
ADPR TBG 2 MALE LL ART (MISCELLANEOUS)
BAG COUNTER SPONGE SURGICOUNT (BAG) ×1 IMPLANT
BAG DECANTER FOR FLEXI CONT (MISCELLANEOUS) ×1 IMPLANT
BAG SPNG CNTER NS LX DISP (BAG) ×1
CANISTER SUCT 3000ML PPV (MISCELLANEOUS) ×1 IMPLANT
CATH ROBINSON RED A/P 18FR (CATHETERS) ×1 IMPLANT
CLIP LIGATING EXTRA MED SLVR (CLIP) ×1 IMPLANT
CLIP LIGATING EXTRA SM BLUE (MISCELLANEOUS) ×1 IMPLANT
COVER PROBE W GEL 5X96 (DRAPES) ×1 IMPLANT
DERMABOND ADVANCED .7 DNX12 (GAUZE/BANDAGES/DRESSINGS) ×1 IMPLANT
ELECT REM PT RETURN 9FT ADLT (ELECTROSURGICAL) ×1
ELECTRODE REM PT RTRN 9FT ADLT (ELECTROSURGICAL) ×1 IMPLANT
GLOVE BIO SURGEON STRL SZ7.5 (GLOVE) ×1 IMPLANT
GOWN STRL REUS W/ TWL LRG LVL3 (GOWN DISPOSABLE) ×2 IMPLANT
GOWN STRL REUS W/ TWL XL LVL3 (GOWN DISPOSABLE) ×1 IMPLANT
GOWN STRL REUS W/TWL LRG LVL3 (GOWN DISPOSABLE) ×2
GOWN STRL REUS W/TWL XL LVL3 (GOWN DISPOSABLE) ×1
HEMOSTAT SNOW SURGICEL 2X4 (HEMOSTASIS) IMPLANT
INSERT FOGARTY SM (MISCELLANEOUS) IMPLANT
IV ADAPTER SYR DOUBLE MALE LL (MISCELLANEOUS) IMPLANT
KIT BASIN OR (CUSTOM PROCEDURE TRAY) ×1 IMPLANT
KIT SHUNT ARGYLE CAROTID ART 6 (VASCULAR PRODUCTS) ×1 IMPLANT
KIT TURNOVER KIT B (KITS) ×1 IMPLANT
LOOP VASCULAR MINI 18 RED (MISCELLANEOUS) ×1
NDL HYPO 25GX1X1/2 BEV (NEEDLE) IMPLANT
NDL SPNL 20GX3.5 QUINCKE YW (NEEDLE) IMPLANT
NEEDLE HYPO 25GX1X1/2 BEV (NEEDLE) IMPLANT
NEEDLE SPNL 20GX3.5 QUINCKE YW (NEEDLE) IMPLANT
NS IRRIG 1000ML POUR BTL (IV SOLUTION) ×3 IMPLANT
PACK CAROTID (CUSTOM PROCEDURE TRAY) ×1 IMPLANT
PAD ARMBOARD 7.5X6 YLW CONV (MISCELLANEOUS) ×2 IMPLANT
PATCH HEMASHIELD 8X75 (Vascular Products) IMPLANT
POSITIONER HEAD DONUT 9IN (MISCELLANEOUS) ×1 IMPLANT
POWDER SURGICEL 3.0 GRAM (HEMOSTASIS) IMPLANT
STOPCOCK 4 WAY LG BORE MALE ST (IV SETS) IMPLANT
SUT ETHILON 3 0 PS 1 (SUTURE) IMPLANT
SUT MNCRL AB 4-0 PS2 18 (SUTURE) ×1 IMPLANT
SUT PROLENE 5 0 C 1 24 (SUTURE) IMPLANT
SUT PROLENE 6 0 BV (SUTURE) ×1 IMPLANT
SUT SILK 3 0 (SUTURE)
SUT SILK 3-0 18XBRD TIE 12 (SUTURE) IMPLANT
SUT VIC AB 3-0 SH 27 (SUTURE) ×1
SUT VIC AB 3-0 SH 27X BRD (SUTURE) ×1 IMPLANT
SYR CONTROL 10ML LL (SYRINGE) IMPLANT
TOWEL GREEN STERILE (TOWEL DISPOSABLE) ×1 IMPLANT
TUBING ART PRESS 48 MALE/FEM (TUBING) IMPLANT
VASCULAR TIE MINI RED 18IN STL (MISCELLANEOUS) IMPLANT
WATER STERILE IRR 1000ML POUR (IV SOLUTION) ×1 IMPLANT

## 2023-01-13 NOTE — H&P (Signed)
HPI:   Amber Barry is a 82 y.o. female with history of right carotid endarterectomy for asymptomatic high-grade stenosis performed almost 10 years ago.  I have been following her for high-grade recurrent stenosis of the common carotid artery and she has been maintained on three 81 mg aspirin tablets daily and is recently started on statin which she is tolerating well.  She was also recently evaluated in the emergency department for lightheadedness found not to have a stroke but CT of her neck and head did demonstrate this high-grade stenosis.  She is now here to discuss her options.       Past Medical History:  Diagnosis Date   Anemia     Anxiety     Arthritis      Osteoarthritis   Bursitis      left hip-had injection 2015   Carotid artery occlusion April 2014    Left Bruit, 80% blockage   CKD (chronic kidney disease)      Stage III   Diverticulitis     Hematuria     Hypertension     Interstitial cystitis     Lower back pain June 2015   Ocular migraine     Osteopenia     PONV (postoperative nausea and vomiting)           Family History  Problem Relation Age of Onset   COPD Mother          Respiratory Disease   Rheumatic fever Mother     Pulmonary disease Mother     Heart attack Mother     Stroke Father     Hypertension Father     Diabetes Paternal Aunt     Prostate cancer Maternal Grandfather     Hypertension Paternal Aunt     Hypertension Paternal Grandmother     Other Maternal Grandmother          hardening arteries   Stroke Other          paternal side         Past Surgical History:  Procedure Laterality Date   ENDARTERECTOMY Right 12/22/2012    Procedure: Right Carotid Endarterectomy with hemashield patch angioplasty;  Surgeon: Pryor Ochoa, MD;  Location: [facility] OR;  Service: Vascular;  Laterality: Right;   TONSILLECTOMY   1947      Short Social History:  Social History         Tobacco Use   Smoking status: Never      Passive exposure: Never    Smokeless tobacco: Never  Substance Use Topics   Alcohol use: No           Allergies  Allergen Reactions   Benicar Hct [Olmesartan Medoxomil-Hctz] Swelling      Facial swelling   Buspar [Buspirone] Other (See Comments)      Facial Dysesthesia   Norvasc [Amlodipine Besylate] Swelling      legs   Zoloft [Sertraline Hcl] Anxiety      Increased anxiety, dysesthesia   Lipitor [Atorvastatin] Other (See Comments)      Generalized muscle aches   Lisinopril Swelling      Around eyes   Motrin [Ibuprofen] Other (See Comments)       Contraindicated with Celebrex. Pt states MD said to not take together.   Amitriptyline Hcl Anxiety      And Paresthesias   Pravastatin Other (See Comments)      Pt. Wanted to sleep, no energy.  Current Outpatient Medications  Medication Sig Dispense Refill   acetaminophen (TYLENOL) 500 MG tablet Take 500 mg by mouth every 6 (six) hours as needed.       ALPRAZolam (XANAX) 0.25 MG tablet Take 0.125 mg by mouth 2 (two) times daily as needed for anxiety.        aspirin EC 81 MG tablet Take 243 mg by mouth daily. Pt  Takes x's 3  81 mg tabs per day       azelastine (ASTELIN) 0.1 % nasal spray Place 2 sprays into both nostrils 2 (two) times daily as needed for rhinitis or allergies. As needed   12   Calcium-Vitamin D (CALTRATE 600 PLUS-VIT D PO) Take 1 tablet by mouth daily at 12 noon.       Celecoxib (CELEBREX PO) Take 200 mg by mouth daily as needed (pain). Not more than 2 per week       Cholecalciferol (VITAMIN D) 2000 UNITS CAPS Take 2,000 Units by mouth daily at 12 noon.        escitalopram (LEXAPRO) 10 MG tablet TK 1 T PO QD   6   hydrochlorothiazide (HYDRODIURIL) 25 MG tablet Take 25 mg by mouth daily.    3   moexipril (UNIVASC) 15 MG tablet Take 15 mg by mouth daily.       Probiotic Product (ALIGN) 4 MG CAPS Take 4 mg by mouth daily.       Pseudoephedrine HCl (SUDAFED PO) Take 30 mg by mouth as needed (allergies).        rosuvastatin (CRESTOR) 5  MG tablet Take 1 tablet (5 mg total) by mouth daily. 90 tablet 3    No current facility-administered medications for this visit.      Review of Systems  Constitutional:  Constitutional negative. HENT: HENT negative.  Eyes: Eyes negative.  Respiratory: Respiratory negative.  Cardiovascular: Cardiovascular negative.  GI: Gastrointestinal negative.  Musculoskeletal: Musculoskeletal negative.  Skin: Skin negative.  Neurological: Positive for light-headedness.  Hematologic: Hematologic/lymphatic negative.  Psychiatric: Psychiatric negative.          Objective:   Vitals:   01/13/23 0550  BP: (!) 172/72  Pulse: 71  Resp: 17  Temp: 98.9 F (37.2 C)  SpO2: 98%      Physical Exam HENT:     Nose: Nose normal.  Eyes:     Pupils: Pupils are equal, round, and reactive to light.  Neck:     Vascular: Carotid bruit present.  Cardiovascular:     Rate and Rhythm: Normal rate.     Pulses: Normal pulses.  Pulmonary:     Effort: Pulmonary effort is normal.     Breath sounds: Normal breath sounds.  Abdominal:     General: Abdomen is flat.     Palpations: Abdomen is soft.  Musculoskeletal:        General: Normal range of motion.     Left lower leg: No edema.  Skin:    General: Skin is warm and dry.     Capillary Refill: Capillary refill takes less than 2 seconds.  Psychiatric:        Mood and Affect: Mood normal.        Thought Content: Thought content normal.        Judgment: Judgment normal.        Data: Right Carotid Findings:  +----------+--------+--------+--------+------------------+--------+           PSV cm/sEDV cm/sStenosisPlaque DescriptionComments  +----------+--------+--------+--------+------------------+--------+  CCA Prox  81  20                                          +----------+--------+--------+--------+------------------+--------+  CCA Distal402     91      >50%    heterogenous                 +----------+--------+--------+--------+------------------+--------+  ICA Prox  126     6       1-39%                               +----------+--------+--------+--------+------------------+--------+  ICA Mid   76      20                                          +----------+--------+--------+--------+------------------+--------+  ICA Distal62      13                                          +----------+--------+--------+--------+------------------+--------+  ECA      98      7               heterogenous                +----------+--------+--------+--------+------------------+--------+   +----------+--------+-------+----------------+-------------------+           PSV cm/sEDV cmsDescribe        Arm Pressure (mmHG)  +----------+--------+-------+----------------+-------------------+  [id number]    0      Multiphasic, WNL                     +----------+--------+-------+----------------+-------------------+   +---------+--------+--+--------+--+---------+  VertebralPSV cm/s82EDV cm/s22Antegrade  +---------+--------+--+--------+--+---------+      Left Carotid Findings:  +----------+--------+--------+--------+---------------------------+--------  +           PSV cm/sEDV cm/sStenosisPlaque Description          Comments  +----------+--------+--------+--------+---------------------------+--------  +  CCA Prox  105     16                                                    +----------+--------+--------+--------+---------------------------+--------  +  CCA Distal79      14                                                    +----------+--------+--------+--------+---------------------------+--------  +  ICA Prox  105     24      1-39%   hypoechoic and heterogenous           +----------+--------+--------+--------+---------------------------+--------  +  ICA Mid   143     25                                                     +----------+--------+--------+--------+---------------------------+--------  +  ICA Distal107     17                                                    +----------+--------+--------+--------+---------------------------+--------  +  ECA      64      0                                                     +----------+--------+--------+--------+---------------------------+--------  +   +----------+--------+--------+----------------+-------------------+           PSV cm/sEDV cm/sDescribe        Arm Pressure (mmHG)  +----------+--------+--------+----------------+-------------------+  [id number]    0       Multiphasic, WNL                     +----------+--------+--------+----------------+-------------------+   +---------+--------+--+--------+--+---------+  VertebralPSV cm/s49EDV cm/s13Antegrade  +---------+--------+--+--------+--+---------+         Summary:  Right Carotid: Velocities in the right ICA are consistent with a 1-39%  stenosis.                The ECA appears >50% stenosed.   Left Carotid: Velocities in the left ICA are consistent with a 1-39%  stenosis.   Vertebrals: Bilateral vertebral arteries demonstrate antegrade flow.  Subclavians: Normal flow hemodynamics were seen in bilateral subclavian               arteries.    CT IMPRESSION: 1. No acute intracranial process. 2. Approximately 80% stenosis in the mid to distal right common carotid artery, just proximal to the endarterectomy site, which has progressed from the prior exam. 3. Approximately 50% stenosis in the proximal left ICA, similar to the prior exam. 4. No intracranial large vessel occlusion or significant stenosis.      Assessment/Plan:    82 year old female with high-grade distal right common carotid artery stenosis which really looks to be approximately 90 to 95%.  We discussed the options being medical therapy with the addition of Plavix to her  81 mg aspirin that she takes 3 times daily versus transfemoral stenting versus carotid endarterectomy.  She is not a candidate for transfemoral or transcarotid stenting. Plan for right common carotid endarterectomy today in the OR.      Darlin Stenseth C. Randie Heinz, MD [facility] Office: [phone] Pager: [phone]

## 2023-01-13 NOTE — Op Note (Signed)
Patient name: Amber Barry MRN: [id number] DOB: [birth year] Sex: female  01/13/2023 Pre-operative Diagnosis: Asymptomatic recurrent high-grade right carotid stenosis Post-operative diagnosis:  Same Surgeon:  Apolinar Junes C. Randie Heinz, MD Assistant: Doreatha Massed, PA Procedure Performed:  Redo right carotid endarterectomy with Dacron patch angioplasty  Indications: 82 year old female with a history of a right carotid endarterectomy performed 10 years ago for asymptomatic disease with high-grade recurrent stenosis at the proximal aspect of the patch that is not amenable to stenting.  She did have an episode of dizziness with concern for neurologic deficit and we have discussed her options being medical therapy versus carotid endarterectomy.  She has not the stent candidate.  We discussed the risk benefits alternatives and she demonstrates good understanding.  Experience assistant was necessary to facilitate exposure of the carotid artery and heavily scarred field with exposing the common carotid artery proximal to the stenosed area as well as the external carotid artery and internal carotid artery.  Findings: There was dense scarring throughout the previous endarterectomy site.  The previous dacryon patch was identified and the external and internal carotid arteries were isolated.  Ultrasound was used to identify the level of the high-grade stenosis at the proximal area of the patch.  Upon opening the patch there was minimal backbleeding from the ICA and a 10 [place] shunt was placed and flow was confirmed.  The plaque itself was a mix of atherosclerotic soft plaque and was a very concerning lesion.  At completion there was very strong flow distally to the ICA that was low resistance by Doppler.   Procedure:  The patient was identified in the holding area and taken to the operating where she is placed upon operative table general anesthesia was induced.  She was delivered via the right neck and chest in usual  fashion, antibiotics were administered a timeout was called.  We began by reopening her previous incision.  We dissected down through the skin and subcutaneous tissue to the heavy dense scar tissue and retracted the sternocleidomastoid laterally.  I began inferiorly where I was able to find a plane identified the IJ retracted laterally the common carotid artery encircled this with umbilical tape and patient was fully heparinized.  We then tediously dissected higher on the common carotid artery identified the vagus nerve and this was protected and then used ultrasound to identify where the lesion as well as the bifurcation of the common carotid artery.  I was then able to dissect out the external carotid artery and divided the superior thyroidal branch between ties.  The ICA was then identified and encircled with vessel loop.  The ACT was confirmed greater than 250.  Clamps were prepared as well as a backup patch and 10 [place].  The mean arterial pressure was slightly above 90 we clamped the ICA followed by the common carotid artery and then the ECA.  I opened the previous patch longitudinally and there was significant disease there.  The ICA clamp was released and there was minimal backbleeding.  A 10 French shunt was placed distally allowed to backbleed and then placed proximally.  Flow was confirmed and the shunt.  We then proceeded with endarterectomy and there was smooth tapering distally.  The carotid bulb was thoroughly irrigated with heparinized saline and the dacryon patch was sewn in place with 5-0 Prolene suture.  Prior to completion the shunt was removed we allowed flushing all directions and thoroughly irrigated the bulb with multiple syringes of heparinized saline.  Patch angioplasty was  then completed.  Doppler demonstrated somewhat high resistance flow in the external carotid artery with very low resistance flow in the ICA distally.  Satisfied with this we irrigated the wound.  Administered 50 mg  protamine obtain Restasis meticulously and then closed to the right with Vicryl and the skin with Monocryl.  Dermabond was placed at the skin level.  The patient was awakened from anesthesia having tolerated the procedure well without any complication.  Counts were correct at completion.     EBL: 50cc   Aalayah Riles C. Randie Heinz, MD [facility] Office: [phone] Pager: [phone]

## 2023-01-13 NOTE — Transfer of Care (Signed)
Immediate Anesthesia Transfer of Care Note  Patient: Amber Barry  Procedure(s) Performed: RIGHT ENDARTERECTOMY COMMON CAROTID (Right: Neck) PATCH ANGIOPLASTY (Right: Neck)  Patient Location: PACU  Anesthesia Type:General  Level of Consciousness: awake, drowsy, patient cooperative, and responds to stimulation  Airway & Oxygen Therapy: Patient Spontanous Breathing and Patient connected to face mask oxygen  Post-op Assessment: Report given to RN, Post -op Vital signs reviewed and stable, and Patient moving all extremities X 4  Post vital signs: Reviewed and stable  Last Vitals:  Vitals Value Taken Time  BP 166/70 01/13/23 1025  Temp    Pulse 90 01/13/23 1027  Resp 19 01/13/23 1027  SpO2 100 % 01/13/23 1027  Vitals shown include unvalidated device data.  Last Pain:  Vitals:   01/13/23 0557  TempSrc:   PainSc: 0-No pain      Patients Stated Pain Goal: 0 (01/13/23 0557)  Complications:  Encounter Notable Events  Notable Event Outcome Phase Comment  Difficult to intubate - expected  Intraprocedure Filed from anesthesia note documentation.

## 2023-01-13 NOTE — Anesthesia Postprocedure Evaluation (Signed)
Anesthesia Post Note  Patient: Amber Barry  Procedure(s) Performed: RIGHT ENDARTERECTOMY COMMON CAROTID (Right: Neck) PATCH ANGIOPLASTY (Right: Neck)     Patient location during evaluation: PACU Anesthesia Type: General Level of consciousness: awake and alert Pain management: pain level controlled Vital Signs Assessment: post-procedure vital signs reviewed and stable Respiratory status: spontaneous breathing, nonlabored ventilation, respiratory function stable and patient connected to nasal cannula oxygen Cardiovascular status: blood pressure returned to baseline and stable Postop Assessment: no apparent nausea or vomiting Anesthetic complications: yes  Encounter Notable Events  Notable Event Outcome Phase Comment  Difficult to intubate - expected  Intraprocedure Filed from anesthesia note documentation.    Last Vitals:  Vitals:   01/13/23 1140 01/13/23 1155  BP: (!) 116/48 (!) 123/53  Pulse: 89 83  Resp: 18 17  Temp:  36.8 C  SpO2: 95% 96%    Last Pain:  Vitals:   01/13/23 1155  TempSrc:   PainSc: Asleep                 Rocio Wolak L Wyolene Weimann

## 2023-01-13 NOTE — Discharge Instructions (Signed)
   [facility]  Discharge Instructions   Carotid Surgery  Please refer to the following instructions for your post-procedure care. Your surgeon or physician assistant will discuss any changes with you.  Activity  You are encouraged to walk as much as you can. You can slowly return to normal activities but must avoid strenuous activity and heavy lifting until your doctor tell you it's okay. Avoid activities such as vacuuming or swinging a golf club. You can drive after one week if you are comfortable and you are no longer taking prescription pain medications. It is normal to feel tired for serval weeks after your surgery. It is also normal to have difficulty with sleep habits, eating, and bowel movements after surgery. These will go away with time.  Bathing/Showering  Shower daily after you go home. Do not soak in a bathtub, hot tub, or swim until the incision heals completely.  Incision Care  Shower every day. Clean your incision with mild soap and water. Pat the area dry with a clean towel. You do not need a bandage unless otherwise instructed. Do not apply any ointments or creams to your incision. You may have skin glue on your incision. Do not peel it off. It will come off on its own in about one week. Your incision may feel thickened and raised for several weeks after your surgery. This is normal and the skin will soften over time.   For Men Only: It's okay to shave around the incision but do not shave the incision itself for 2 weeks. It is common to have numbness under your chin that could last for several months.  Diet  Resume your normal diet. There are no special food restrictions following this procedure. A low fat/low cholesterol diet is recommended for all patients with vascular disease. In order to heal from your surgery, it is CRITICAL to get adequate nutrition. Your body requires vitamins, minerals, and protein. Vegetables are the best source of  vitamins and minerals. Vegetables also provide the perfect balance of protein. Processed food has little nutritional value, so try to avoid this.  Medications  Resume taking all of your medications unless your doctor or physician assistant tells you not to. If your incision is causing pain, you may take over-the- counter pain relievers such as acetaminophen (Tylenol). If you were prescribed a stronger pain medication, please be aware these medications can cause nausea and constipation. Prevent nausea by taking the medication with a snack or meal. Avoid constipation by drinking plenty of fluids and eating foods with a high amount of fiber, such as fruits, vegetables, and grains.   Do not take Tylenol if you are taking prescription pain medications.  Follow Up  Our office will schedule a follow up appointment 2-3 weeks following discharge.  Please call us immediately for any of the following conditions  . Increased pain, redness, drainage (pus) from your incision site. . Fever of 101 degrees or higher. . If you should develop stroke (slurred speech, difficulty swallowing, weakness on one side of your body, loss of vision) you should call 911 and go to the nearest emergency room. .  Reduce your risk of vascular disease:  . Stop smoking. If you would like help call [facility] at 1-800-QUIT-NOW (1-800-784-8669) or [place] at 336-586-4000. . Manage your cholesterol . Maintain a desired weight . Control your diabetes . Keep your blood pressure down .  If you have any questions, please call the office at [phone]. 

## 2023-01-13 NOTE — Anesthesia Procedure Notes (Addendum)
Procedure Name: Intubation Date/Time: 01/13/2023 7:51 AM  Performed by: Ayesha Rumpf, CRNAPre-anesthesia Checklist: Patient identified, Emergency Drugs available, Suction available and Patient being monitored Patient Re-evaluated:Patient Re-evaluated prior to induction Oxygen Delivery Method: Circle System Utilized Preoxygenation: Pre-oxygenation with 100% oxygen Induction Type: IV induction Ventilation: Mask ventilation without difficulty Laryngoscope Size: Glidescope and 3 Grade View: Grade II Tube type: Oral Tube size: 7.0 mm Number of attempts: 1 Airway Equipment and Method: Stylet and Oral airway Placement Confirmation: ETT inserted through vocal cords under direct vision, positive ETCO2 and breath sounds checked- equal and bilateral Secured at: 20 cm Tube secured with: Tape Dental Injury: Teeth and Oropharynx as per pre-operative assessment  Difficulty Due To: Difficulty was anticipated, Difficult Airway- due to anterior larynx, Difficult Airway- due to limited oral opening and Difficult Airway- due to reduced neck mobility

## 2023-01-13 NOTE — Anesthesia Procedure Notes (Signed)
Arterial Line Insertion Start/End6/11/2022 7:00 AM, 01/13/2023 7:05 AM Performed by: Elmer Picker, MD, CRNA  Patient location: Pre-op. Preanesthetic checklist: patient identified, IV checked, site marked, risks and benefits discussed, surgical consent, monitors and equipment checked, pre-op evaluation, timeout performed and anesthesia consent Lidocaine 1% used for infiltration Right, radial was placed Catheter size: 20 G Hand hygiene performed , maximum sterile barriers used  and Seldinger technique used Allen's test indicative of satisfactory collateral circulation Attempts: 1 Procedure performed without using ultrasound guided technique. Following insertion, dressing applied. Post procedure assessment: normal and unchanged  Patient tolerated the procedure well with no immediate complications.

## 2023-01-14 ENCOUNTER — Encounter (HOSPITAL_COMMUNITY): Payer: Self-pay | Admitting: Vascular Surgery

## 2023-01-14 LAB — BASIC METABOLIC PANEL
Anion gap: 8 (ref 5–15)
BUN: 18 mg/dL (ref 8–23)
CO2: 24 mmol/L (ref 22–32)
Calcium: 8.5 mg/dL — ABNORMAL LOW (ref 8.9–10.3)
Chloride: 101 mmol/L (ref 98–111)
Creatinine, Ser: 1.09 mg/dL — ABNORMAL HIGH (ref 0.44–1.00)
GFR, Estimated: 51 mL/min — ABNORMAL LOW (ref >60)
Glucose, Bld: 124 mg/dL — ABNORMAL HIGH (ref 70–99)
Potassium: 4.4 mmol/L (ref 3.5–5.1)
Sodium: 133 mmol/L — ABNORMAL LOW (ref 135–145)

## 2023-01-14 LAB — CBC
HCT: 27.6 % — ABNORMAL LOW (ref 36.0–46.0)
Hemoglobin: 9.2 g/dL — ABNORMAL LOW (ref 12.0–15.0)
MCH: 30.6 pg (ref 26.0–34.0)
MCHC: 33.3 g/dL (ref 30.0–36.0)
MCV: 91.7 fL (ref 80.0–100.0)
Platelets: 136 10*3/uL — ABNORMAL LOW (ref 150–400)
RBC: 3.01 MIL/uL — ABNORMAL LOW (ref 3.87–5.11)
RDW: 12.6 % (ref 11.5–15.5)
WBC: 10.5 10*3/uL (ref 4.0–10.5)
nRBC: 0 % (ref 0.0–0.2)

## 2023-01-14 LAB — LIPID PANEL
Cholesterol: 72 mg/dL (ref 0–200)
HDL: 40 mg/dL — ABNORMAL LOW (ref >40)
LDL Cholesterol: 28 mg/dL (ref 0–99)
Total CHOL/HDL Ratio: 1.8 RATIO
Triglycerides: 22 mg/dL (ref <150)
VLDL: 4 mg/dL (ref 0–40)

## 2023-01-14 MED ORDER — OXYCODONE-ACETAMINOPHEN 5-325 MG PO TABS: 1.0000 | ORAL_TABLET | Freq: Four times a day (QID) | ORAL | 0 refills | Status: DC | PRN

## 2023-01-14 NOTE — Progress Notes (Signed)
PHARMACIST LIPID MONITORING   Amber Barry is a 82 y.o. female admitted on 01/13/2023 with high-grade distal right common carotid stenosis s/p redo R carotid endarterectomy with Dacron patch angioplasty.  Pharmacy has been consulted to optimize lipid-lowering therapy with the indication of secondary prevention for clinical ASCVD.  Recent Labs:  Lipid Panel (last 6 months):   Lab Results  Component Value Date   CHOL 72 01/14/2023   TRIG 22 01/14/2023   HDL 40 (L) 01/14/2023   CHOLHDL 1.8 01/14/2023   VLDL 4 01/14/2023   LDLCALC 28 01/14/2023    Hepatic function panel (last 6 months):   Lab Results  Component Value Date   AST 22 01/06/2023   ALT 15 01/06/2023   ALKPHOS 48 01/06/2023   BILITOT 0.6 01/06/2023    SCr (since admission):   Serum creatinine: 1.09 mg/dL (H) 40/98/11 9147 Estimated creatinine clearance: 29.7 mL/min (A)  Current therapy and lipid therapy tolerance Current lipid-lowering therapy: rosuvastatin 5mg  daily (since 01/24) Previous lipid-lowering therapies (if applicable):  Pravastatin 10mg  Atorvastatin 10mg  Documented or reported allergies or intolerances to lipid-lowering therapies (if applicable): myalgias with atorvastatin (07/05/2013), fatigue with pravastatin (07/12/2014)  Assessment:   LDL of 28 is below goal LDL <55 on current regimen of rosuvastatin 5mg  po daily. Pt has been tolerating rosuvastatin well without complaints at current dose and has had intolerances with other statins. No change in lipid lowering therapy indicated at this time.  Plan:    1.Statin intensity (high intensity recommended for all patients regardless of the LDL):  No statin changes. Pt is at goal despite not being on a high-intensity statin.  2.Add ezetimibe (if any one of the following):   Not indicated at this time.  3.Refer to lipid clinic:   No  4.Follow-up with:  Primary care provider - Blair Heys, MD  5.Follow-up labs after discharge:  No changes in lipid  therapy, repeat a lipid panel in one year.       Wilburn Cornelia, PharmD, BCPS Clinical Pharmacist 01/14/2023 7:40 AM   Please refer to [facility] for pharmacy phone number

## 2023-01-14 NOTE — Progress Notes (Signed)
Pt told nurse that she would wait till she gets home to get a bath.

## 2023-01-14 NOTE — TOC Transition Note (Addendum)
Transition of Care (TOC) - CM/SW Discharge Note Donn Pierini RN, BSN Transitions of Care Unit 4E- RN Case Manager See Treatment Team for direct phone #   Patient Details  Name: Amber Barry MRN: [id number] Date of Birth: [birth year]  Transition of Care [facility]) CM/SW Contact:  Darrold Span, RN Phone Number: 01/14/2023, 12:59 PM   Clinical Narrative:    CM spoke with pt at bedside regarding transition needs- anticipate d/c this afternoon after MD rounds.  Notified by [facility] liaison -following patient with MD office protocol referral prearranged for [facility] needs. Discussed with pt and son [facility] referral by [facility] office to [facility]- [facility] attempted to call pt in room yesterday but were unable to speak with pt- pt aware and asked if she would need [facility]- plans to speak with MD when he rounds.  Explained that [facility] would call her post discharge to follow up and can provide services if needed- pt voiced understanding and is agreeable.  Address confirmed as [address], [place] [address]. Phone # and PCP confirmed in Epic.   Per pt her daughter lives down the street and will be assisting on return home. Family (son) to transport home.   [facility] liaison notified of address and planned d/c for today.   No further TOC needs noted.    Final next level of care: Home w Home Health Services Barriers to Discharge: No Barriers Identified   Patient Goals and CMS Choice CMS Medicare.gov Compare Post Acute Care list provided to:: Patient Choice offered to / list presented to : Patient  Discharge Placement                 Home w/ [facility]        Discharge Plan and Services Additional resources added to the After Visit Summary for     Discharge Planning Services: CM Consult Post Acute Care Choice: Home Health          DME Arranged: N/A DME Agency: NA         HH Agency: [facility] Date [facility] Agency Contacted: 01/14/23 Time HH Agency Contacted: 1130 Representative spoke with  at [facility] Agency: Bjorn Loser  Social Determinants of Health (SDOH) Interventions SDOH Screenings   Tobacco Use: Low Risk  (01/14/2023)     Readmission Risk Interventions    01/14/2023   12:58 PM  Readmission Risk Prevention Plan  Post Dischage Appt Complete  Medication Screening Complete  Transportation Screening Complete

## 2023-01-14 NOTE — Progress Notes (Addendum)
  Progress Note    01/14/2023 8:17 AM 1 Day Post-Op  Subjective:  no major complaints. Got a little unstable and lightheaded this morning when trying to ambulate to the bathroom. Says she tolerated ambulating in room last night though. Tolerating diet   Vitals:   01/13/23 2300 01/14/23 0100  BP: 123/69 (!) 109/45  Pulse: 74 67  Resp: 20 20  Temp: 98.3 F (36.8 C)   SpO2: 98% 99%   Physical Exam: Cardiac:  regular Lungs:  non labored Incisions:  right neck incision is clean, dry and intact without any swelling or hematoma Extremities:  moving all extremities without deficits Neurologic: alert and oriented. Speech coherent. Smile symmetric. Tongue midline   CBC    Component Value Date/Time   WBC 10.5 01/14/2023 0327   RBC 3.01 (L) 01/14/2023 0327   HGB 9.2 (L) 01/14/2023 0327   HCT 27.6 (L) 01/14/2023 0327   PLT 136 (L) 01/14/2023 0327   MCV 91.7 01/14/2023 0327   MCH 30.6 01/14/2023 0327   MCHC 33.3 01/14/2023 0327   RDW 12.6 01/14/2023 0327   LYMPHSABS 1.5 08/28/2022 1619   MONOABS 0.9 08/28/2022 1619   EOSABS 0.4 08/28/2022 1619   BASOSABS 0.0 08/28/2022 1619    BMET    Component Value Date/Time   NA 133 (L) 01/14/2023 0327   K 4.4 01/14/2023 0327   CL 101 01/14/2023 0327   CO2 24 01/14/2023 0327   GLUCOSE 124 (H) 01/14/2023 0327   BUN 18 01/14/2023 0327   CREATININE 1.09 (H) 01/14/2023 0327   CALCIUM 8.5 (L) 01/14/2023 0327   GFRNONAA 51 (L) 01/14/2023 0327   GFRAA 56 (L) 02/13/2017 0945    INR    Component Value Date/Time   INR 1.1 01/06/2023 1500     Intake/Output Summary (Last 24 hours) at 01/14/2023 0817 Last data filed at 01/14/2023 0018 Gross per 24 hour  Intake 1090 ml  Output 30 ml  Net 1060 ml     Assessment/Plan:  82 y.o. female is s/p redo right CEA 1 Day Post-Op   Neurologically intact Right neck incision is intact and well appearing Some ecchymosis and swelling but neck is soft Hemodynamically stable Have her ambulate this  morning Anticipate discharge home later today  Will have follow up arranged in 2-3 weeks for incision check   Graceann Congress, PA-C [facility] [phone] 01/14/2023 8:17 AM  I have independently interviewed and examined patient and agree with PA assessment and plan above.  Progressing as expected after redo right CEA.  Okay for discharge today follow-up in a few weeks for wound check.  Jonathan Kirkendoll C. Randie Heinz, MD [facility] [place] Office: [phone] Pager: [phone]

## 2023-01-15 NOTE — Discharge Summary (Signed)
Carotid Discharge Summary     Amber WAHLER 1940-08-19 82 y.o. female  [id number]  Admission Date: 01/13/2023  Discharge Date: 01/14/2023  Physician: Maeola Harman, MD  Admission Diagnosis: Carotid stenosis [I65.29] Asymptomatic carotid artery stenosis without infarction, right [facility]  Hospital Course:  The patient was admitted to the hospital and taken to the operating room on 01/13/2023 and underwent right carotid endarterectomy.  The pt tolerated the procedure well and was transported to the PACU in good condition.  By POD 1, the pt neuro status remained intact. She remained hemodynamically stable. Right neck incision mildly swollen with ecchymosis but soft without hematoma. No difficulty swallowing, breathing or talking. The remainder of the hospital course consisted of increasing mobilization and increasing intake of solids without difficulty.  She remained stable for discharge home. She will continue on Aspirin and Statin.She will resume all her home medications as prescribed. PDMP reviewed and post operative pain medication was sent to her pharmacy. She will follow up in our office in 2-3 weeks for incision check.    Recent Labs    01/14/23 0327  NA 133*  K 4.4  CL 101  CO2 24  GLUCOSE 124*  BUN 18  CALCIUM 8.5*   Recent Labs    01/14/23 0327  WBC 10.5  HGB 9.2*  HCT 27.6*  PLT 136*   No results for input(s): "INR" in the last 72 hours.   Discharge Instructions     CAROTID Sugery: Call MD for difficulty swallowing or speaking; weakness in arms or legs that is a new symtom; severe headache.  If you have increased swelling in the neck and/or  are having difficulty breathing, CALL 911   Complete by: As directed    Call MD for:  redness, tenderness, or signs of infection (pain, swelling, bleeding, redness, odor or green/yellow discharge around incision site)   Complete by: As directed    Call MD for:  severe or increased pain, loss or decreased  feeling  in affected limb(s)   Complete by: As directed    Call MD for:  temperature >100.5   Complete by: As directed    Driving Restrictions   Complete by: As directed    No driving while taking narcotic pain medication   Increase activity slowly   Complete by: As directed    Walk with assistance use walker or cane as needed   Lifting restrictions   Complete by: As directed    No lifting for 4 weeks   May shower    Complete by: As directed    Resume previous diet   Complete by: As directed    may wash over wound with mild soap and water   Complete by: As directed        Discharge Diagnosis:  Carotid stenosis [I65.29] Asymptomatic carotid artery stenosis without infarction, right [I65.21]  Secondary Diagnosis: Patient Active Problem List   Diagnosis Date Noted   Carotid stenosis 01/13/2023   Asymptomatic carotid artery stenosis without infarction, right 01/13/2023   Hypertension 08/12/2018   Osteopenia 08/12/2018   Unspecified constipation 12/05/2013   Occlusion and stenosis of carotid artery without mention of cerebral infarction 11/30/2012   Past Medical History:  Diagnosis Date   Anemia    pt denies   Anxiety    Arthritis    Osteoarthritis   Bursitis    left hip-had injection 2015   Carotid artery occlusion 11/2012   Left Bruit, 80% blockage   CKD (chronic kidney disease)  Stage III   Diverticulitis    Hematuria    Hypertension    Interstitial cystitis    Lower back pain 01/2014   Ocular migraine    Osteopenia    PONV (postoperative nausea and vomiting)     Allergies as of 01/14/2023       Reactions   Benicar Hct [olmesartan Medoxomil-hctz] Swelling   Facial swelling   Buspar [buspirone] Other (See Comments)   Facial Dysesthesia   Norvasc [amlodipine Besylate] Swelling   legs   Zoloft [sertraline Hcl] Anxiety   Increased anxiety, dysesthesia   Lipitor [atorvastatin] Other (See Comments)   Generalized muscle aches   Lisinopril Swelling    Around eyes   Motrin [ibuprofen] Other (See Comments)    Contraindicated with Celebrex. Pt states MD said to not take together.   Amitriptyline Hcl Anxiety   And Paresthesias   Pravastatin Other (See Comments)   Pt. Wanted to sleep, no energy.        Medication List     TAKE these medications    acetaminophen 500 MG tablet Commonly known as: TYLENOL Take 1,000 mg by mouth daily.   Align 4 MG Caps Take 4 mg by mouth daily.   ALPRAZolam 0.25 MG tablet Commonly known as: XANAX Take 0.125 mg by mouth 2 (two) times daily as needed for anxiety.   aspirin EC 81 MG tablet Take 243 mg by mouth daily.   azelastine 0.1 % nasal spray Commonly known as: ASTELIN Place 2 sprays into both nostrils 2 (two) times daily as needed for rhinitis or allergies.   CALTRATE 600 PLUS-VIT D PO Take 1 tablet by mouth daily at 12 noon. Soft chews   celecoxib 200 MG capsule Commonly known as: CELEBREX Take 200 mg by mouth 2 (two) times a week.   cholecalciferol 25 MCG (1000 UNIT) tablet Commonly known as: VITAMIN D3 Take 1,000 Units by mouth daily at 12 noon.   escitalopram 20 MG tablet Commonly known as: LEXAPRO Take 20 mg by mouth daily.   hydrochlorothiazide 25 MG tablet Commonly known as: HYDRODIURIL Take 25 mg by mouth daily.   moexipril 15 MG tablet Commonly known as: UNIVASC Take 15 mg by mouth daily.   oxyCODONE-acetaminophen 5-325 MG tablet Commonly known as: PERCOCET/ROXICET Take 1 tablet by mouth every 6 (six) hours as needed for moderate pain.   rosuvastatin 5 MG tablet Commonly known as: CRESTOR Take 1 tablet (5 mg total) by mouth daily.   SUDAFED PO Take 30 mg by mouth daily as needed (allergies).         Discharge Instructions:   [facility] Discharge Instructions Carotid Endarterectomy (CEA)  Please refer to the following instructions for your post-procedure care. Your surgeon or physician assistant will discuss any  changes with you.  Activity  You are encouraged to walk as much as you can. You can slowly return to normal activities but must avoid strenuous activity and heavy lifting until your doctor tell you it's OK. Avoid activities such as vacuuming or swinging a golf club. You can drive after one week if you are comfortable and you are no longer taking prescription pain medications. It is normal to feel tired for serval weeks after your surgery. It is also normal to have difficulty with sleep habits, eating, and bowel movements after surgery. These will go away with time.  Bathing/Showering  You may shower after you come home. Do not soak in a bathtub, hot tub, or swim until  the incision heals completely.  Incision Care  Shower every day. Clean your incision with mild soap and water. Pat the area dry with a clean towel. You do not need a bandage unless otherwise instructed. Do not apply any ointments or creams to your incision. You may have skin glue on your incision. Do not peel it off. It will come off on its own in about one week. Your incision may feel thickened and raised for several weeks after your surgery. This is normal and the skin will soften over time. For Men Only: It's OK to shave around the incision but do not shave the incision itself for 2 weeks. It is common to have numbness under your chin that could last for several months.  Diet  Resume your normal diet. There are no special food restrictions following this procedure. A low fat/low cholesterol diet is recommended for all patients with vascular disease. In order to heal from your surgery, it is CRITICAL to get adequate nutrition. Your body requires vitamins, minerals, and protein. Vegetables are the best source of vitamins and minerals. Vegetables also provide the perfect balance of protein. Processed food has little nutritional value, so try to avoid this.  Medications  Resume taking all of your medications unless your doctor or  physician assistant tells you not to.  If your incision is causing pain, you may take over-the- counter pain relievers such as acetaminophen (Tylenol). If you were prescribed a stronger pain medication, please be aware these medications can cause nausea and constipation.  Prevent nausea by taking the medication with a snack or meal. Avoid constipation by drinking plenty of fluids and eating foods with a high amount of fiber, such as fruits, vegetables, and grains. Do not take Tylenol if you are taking prescription pain medications.  Follow Up  Our office will schedule a follow up appointment 2-3 weeks following discharge.  Please call us immediately for any of the following conditions  Increased pain, redness, drainage (pus) from your incision site. Fever of 101 degrees or higher. If you should develop stroke (slurred speech, difficulty swallowing, weakness on one side of your body, loss of vision) you should call 911 and go to the nearest emergency room.  Reduce your risk of vascular disease:  Stop smoking. If you would like help call [facility] at 1-800-QUIT-NOW ((306)174-5184) or [place] at (531)682-4020. Manage your cholesterol Maintain a desired weight Control your diabetes Keep your blood pressure down  If you have any questions, please call the office at [phone].  Prescriptions given: Percocet 5- 325 mg #12 No Refill  Disposition: Home  Patient's condition: is Good  Follow up: 1. Dr. Randie Heinz in 2 weeks.   Rella Egelston PA-C [facility] [phone]   --- For [facility] Registry use ---   Modified Rankin score at D/C (0-6): 0  IV medication needed for:  1. Hypertension: No 2. Hypotension: No  Post-op Complications: No  1. Post-op CVA or TIA: No  If yes: Event classification (right eye, left eye, right cortical, left cortical, verterobasilar, other): n/a  If yes: Timing of event (intra-op, <6 hrs post-op, >=6 hrs post-op, unknown): n/a  2.  CN injury: No  If yes: CN not injuried   3. Myocardial infarction: Yes  If yes: Dx by (EKG or clinical, Troponin): n/a  4.  CHF: No  5.  Dysrhythmia (new): No  6. Wound infection: No  7. Reperfusion symptoms: No  8. Return to OR: No  If yes: return to  OR for (bleeding, neurologic, other CEA incision, other): n/a  Discharge medications: Statin use:  Yes ASA use:  Yes   Beta blocker use:  No ACE-Inhibitor use:  No  ARB use:  No CCB use: No P2Y12 Antagonist use: No, [ ]  Plavix, [ ]  Plasugrel, [ ]  Ticlopinine, [ ]  Ticagrelor, [ ]  Other, [ ]  No for medical reason, [ ]  Non-compliant, [ ]  Not-indicated Anti-coagulant use:  No, [ ]  Warfarin, [ ]  Rivaroxaban, [ ]  Dabigatran,

## 2023-01-16 DIAGNOSIS — I129 Hypertensive chronic kidney disease with stage 1 through stage 4 chronic kidney disease, or unspecified chronic kidney disease: Secondary | ICD-10-CM | POA: Diagnosis not present

## 2023-01-16 DIAGNOSIS — Z7982 Long term (current) use of aspirin: Secondary | ICD-10-CM | POA: Diagnosis not present

## 2023-01-16 DIAGNOSIS — M1990 Unspecified osteoarthritis, unspecified site: Secondary | ICD-10-CM | POA: Diagnosis not present

## 2023-01-16 DIAGNOSIS — M8580 Other specified disorders of bone density and structure, unspecified site: Secondary | ICD-10-CM | POA: Diagnosis not present

## 2023-01-16 DIAGNOSIS — Z48812 Encounter for surgical aftercare following surgery on the circulatory system: Secondary | ICD-10-CM | POA: Diagnosis not present

## 2023-01-16 DIAGNOSIS — Z79891 Long term (current) use of opiate analgesic: Secondary | ICD-10-CM | POA: Diagnosis not present

## 2023-01-16 DIAGNOSIS — N183 Chronic kidney disease, stage 3 unspecified: Secondary | ICD-10-CM | POA: Diagnosis not present

## 2023-01-16 DIAGNOSIS — F419 Anxiety disorder, unspecified: Secondary | ICD-10-CM | POA: Diagnosis not present

## 2023-01-20 DIAGNOSIS — F419 Anxiety disorder, unspecified: Secondary | ICD-10-CM | POA: Diagnosis not present

## 2023-01-20 DIAGNOSIS — Z48812 Encounter for surgical aftercare following surgery on the circulatory system: Secondary | ICD-10-CM | POA: Diagnosis not present

## 2023-01-20 DIAGNOSIS — M8580 Other specified disorders of bone density and structure, unspecified site: Secondary | ICD-10-CM | POA: Diagnosis not present

## 2023-01-20 DIAGNOSIS — I129 Hypertensive chronic kidney disease with stage 1 through stage 4 chronic kidney disease, or unspecified chronic kidney disease: Secondary | ICD-10-CM | POA: Diagnosis not present

## 2023-01-20 DIAGNOSIS — N183 Chronic kidney disease, stage 3 unspecified: Secondary | ICD-10-CM | POA: Diagnosis not present

## 2023-01-20 DIAGNOSIS — M1990 Unspecified osteoarthritis, unspecified site: Secondary | ICD-10-CM | POA: Diagnosis not present

## 2023-01-21 DIAGNOSIS — N183 Chronic kidney disease, stage 3 unspecified: Secondary | ICD-10-CM | POA: Diagnosis not present

## 2023-01-21 DIAGNOSIS — I129 Hypertensive chronic kidney disease with stage 1 through stage 4 chronic kidney disease, or unspecified chronic kidney disease: Secondary | ICD-10-CM | POA: Diagnosis not present

## 2023-01-21 DIAGNOSIS — F419 Anxiety disorder, unspecified: Secondary | ICD-10-CM | POA: Diagnosis not present

## 2023-01-21 DIAGNOSIS — M8580 Other specified disorders of bone density and structure, unspecified site: Secondary | ICD-10-CM | POA: Diagnosis not present

## 2023-01-21 DIAGNOSIS — Z48812 Encounter for surgical aftercare following surgery on the circulatory system: Secondary | ICD-10-CM | POA: Diagnosis not present

## 2023-01-21 DIAGNOSIS — M1990 Unspecified osteoarthritis, unspecified site: Secondary | ICD-10-CM | POA: Diagnosis not present

## 2023-01-23 DIAGNOSIS — M1990 Unspecified osteoarthritis, unspecified site: Secondary | ICD-10-CM | POA: Diagnosis not present

## 2023-01-23 DIAGNOSIS — I129 Hypertensive chronic kidney disease with stage 1 through stage 4 chronic kidney disease, or unspecified chronic kidney disease: Secondary | ICD-10-CM | POA: Diagnosis not present

## 2023-01-23 DIAGNOSIS — Z48812 Encounter for surgical aftercare following surgery on the circulatory system: Secondary | ICD-10-CM | POA: Diagnosis not present

## 2023-01-23 DIAGNOSIS — M8580 Other specified disorders of bone density and structure, unspecified site: Secondary | ICD-10-CM | POA: Diagnosis not present

## 2023-01-23 DIAGNOSIS — F419 Anxiety disorder, unspecified: Secondary | ICD-10-CM | POA: Diagnosis not present

## 2023-01-23 DIAGNOSIS — N183 Chronic kidney disease, stage 3 unspecified: Secondary | ICD-10-CM | POA: Diagnosis not present

## 2023-01-26 DIAGNOSIS — I129 Hypertensive chronic kidney disease with stage 1 through stage 4 chronic kidney disease, or unspecified chronic kidney disease: Secondary | ICD-10-CM | POA: Diagnosis not present

## 2023-01-26 DIAGNOSIS — N183 Chronic kidney disease, stage 3 unspecified: Secondary | ICD-10-CM | POA: Diagnosis not present

## 2023-01-26 DIAGNOSIS — M1990 Unspecified osteoarthritis, unspecified site: Secondary | ICD-10-CM | POA: Diagnosis not present

## 2023-01-26 DIAGNOSIS — Z48812 Encounter for surgical aftercare following surgery on the circulatory system: Secondary | ICD-10-CM | POA: Diagnosis not present

## 2023-01-26 DIAGNOSIS — F419 Anxiety disorder, unspecified: Secondary | ICD-10-CM | POA: Diagnosis not present

## 2023-01-26 DIAGNOSIS — M8580 Other specified disorders of bone density and structure, unspecified site: Secondary | ICD-10-CM | POA: Diagnosis not present

## 2023-01-29 DIAGNOSIS — Z9889 Other specified postprocedural states: Secondary | ICD-10-CM | POA: Diagnosis not present

## 2023-01-29 DIAGNOSIS — R059 Cough, unspecified: Secondary | ICD-10-CM | POA: Diagnosis not present

## 2023-01-29 DIAGNOSIS — Z48812 Encounter for surgical aftercare following surgery on the circulatory system: Secondary | ICD-10-CM | POA: Diagnosis not present

## 2023-01-29 DIAGNOSIS — M8580 Other specified disorders of bone density and structure, unspecified site: Secondary | ICD-10-CM | POA: Diagnosis not present

## 2023-01-29 DIAGNOSIS — I129 Hypertensive chronic kidney disease with stage 1 through stage 4 chronic kidney disease, or unspecified chronic kidney disease: Secondary | ICD-10-CM | POA: Diagnosis not present

## 2023-01-29 DIAGNOSIS — N183 Chronic kidney disease, stage 3 unspecified: Secondary | ICD-10-CM | POA: Diagnosis not present

## 2023-01-29 DIAGNOSIS — M1990 Unspecified osteoarthritis, unspecified site: Secondary | ICD-10-CM | POA: Diagnosis not present

## 2023-01-29 DIAGNOSIS — F419 Anxiety disorder, unspecified: Secondary | ICD-10-CM | POA: Diagnosis not present

## 2023-02-04 ENCOUNTER — Ambulatory Visit (INDEPENDENT_AMBULATORY_CARE_PROVIDER_SITE_OTHER): Payer: Medicare Other | Admitting: Physician Assistant

## 2023-02-04 VITALS — BP 146/65 | HR 79 | Temp 97.3°F | Resp 14 | Ht <= 58 in | Wt 123.9 lb

## 2023-02-04 DIAGNOSIS — R051 Acute cough: Secondary | ICD-10-CM

## 2023-02-04 DIAGNOSIS — I6521 Occlusion and stenosis of right carotid artery: Secondary | ICD-10-CM

## 2023-02-04 NOTE — Progress Notes (Signed)
POST OPERATIVE OFFICE NOTE    CC:  F/u for surgery  HPI:  Amber Barry is a 82 y.o. female who is s/p redo right carotid endarterectomy on 01/13/2023 by Dr. Randie Heinz.  This was done for asymptomatic recurrent critical right ICA stenosis.  She has a history of right carotid endarterectomy in 2014 by Dr. Hart Rochester.  Pt returns today for follow up.  Pt states she has done well since surgery.  She denies any issues with her incision such as drainage, bleeding, or erythema.  She denies any strokelike symptoms such as slurred speech, facial droop, sudden visual changes, or sudden weakness/numbness of the upper or lower extremities.  She also denies any hoarseness or difficulty swallowing.  She has had a persistent dry cough for about 1 week now.  This cough occurs throughout the day and is aggravated by speaking.  She denies any other infectious or respiratory symptoms such as fever, nasal drainage, sore throat, or trouble breathing.  She has gone to her PCP about this cough and was placed on a course of amoxicillin for possible nasal infection.  She has not experienced any benefit from the amoxicillin.  She has also tried to take [employer], a nasal spray, and Mucinex without benefit.   Allergies  Allergen Reactions   Benicar Hct [Olmesartan Medoxomil-Hctz] Swelling    Facial swelling   Buspar [Buspirone] Other (See Comments)    Facial Dysesthesia   Norvasc [Amlodipine Besylate] Swelling    legs   Zoloft [Sertraline Hcl] Anxiety    Increased anxiety, dysesthesia   Lipitor [Atorvastatin] Other (See Comments)    Generalized muscle aches   Lisinopril Swelling    Around eyes   Motrin [Ibuprofen] Other (See Comments)     Contraindicated with Celebrex. Pt states MD said to not take together.   Amitriptyline Hcl Anxiety    And Paresthesias   Pravastatin Other (See Comments)    Pt. Wanted to sleep, no energy.    Current Outpatient Medications  Medication Sig Dispense Refill   acetaminophen  (TYLENOL) 500 MG tablet Take 1,000 mg by mouth daily.     ALPRAZolam (XANAX) 0.25 MG tablet Take 0.125 mg by mouth 2 (two) times daily as needed for anxiety.      aspirin EC 81 MG tablet Take 243 mg by mouth daily.     azelastine (ASTELIN) 0.1 % nasal spray Place 2 sprays into both nostrils 2 (two) times daily as needed for rhinitis or allergies.  12   Calcium-Vitamin D (CALTRATE 600 PLUS-VIT D PO) Take 1 tablet by mouth daily at 12 noon. Soft chews     celecoxib (CELEBREX) 200 MG capsule Take 200 mg by mouth 2 (two) times a week.     cholecalciferol (VITAMIN D3) 25 MCG (1000 UNIT) tablet Take 1,000 Units by mouth daily at 12 noon.     escitalopram (LEXAPRO) 20 MG tablet Take 20 mg by mouth daily.     hydrochlorothiazide (HYDRODIURIL) 25 MG tablet Take 25 mg by mouth daily.   3   moexipril (UNIVASC) 15 MG tablet Take 15 mg by mouth daily.     oxyCODONE-acetaminophen (PERCOCET/ROXICET) 5-325 MG tablet Take 1 tablet by mouth every 6 (six) hours as needed for moderate pain. 12 tablet 0   Probiotic Product (ALIGN) 4 MG CAPS Take 4 mg by mouth daily.     Pseudoephedrine HCl (SUDAFED PO) Take 30 mg by mouth daily as needed (allergies).     rosuvastatin (CRESTOR) 5 MG tablet Take 1  tablet (5 mg total) by mouth daily. 90 tablet 3   No current facility-administered medications for this visit.     ROS:  See HPI  Physical Exam:  Incision:  right sided neck incision dry and intact without hematoma Extremities: Palpable and equal radial pulses bilaterally, moving all extremities equally Neuro: Intact sensory and motor function in bilateral upper and lower extremities, no facial droop, slurred speech, or tongue deviation    Assessment/Plan:  This is a 82 y.o. female who is s/p: redo right carotid endarterectomy on 01/13/2023  -The patient underwent redo right carotid endarterectomy and is doing well since surgery.  She denies any issues with her incision such as drainage or erythema -Her right-sided  neck incision is nearly healed without signs of infection or hematoma -She denies any strokelike symptoms, facial droop, or slurred speech.  She also denies any difficulty swallowing. -Unfortunately starting 1 week after surgery the patient started experiencing a persistent dry cough.  This cough occurs of the day and is aggravated by speech.  She has no other infectious or respiratory symptoms.  She has not benefited from decongestants or antibiotics.  Potentially her cough is due to vagus or recurrent laryngeal nerve irritation.  I am referring her to speech pathology for investigation and treatment. -She will follow up with our office in 9 months with bilateral carotid duplex   Loel Dubonnet, PA-C [facility] [phone]   Clinic MD:  Randie Heinz

## 2023-02-06 DIAGNOSIS — N183 Chronic kidney disease, stage 3 unspecified: Secondary | ICD-10-CM | POA: Diagnosis not present

## 2023-02-06 DIAGNOSIS — I700 Atherosclerosis of aorta: Secondary | ICD-10-CM | POA: Diagnosis not present

## 2023-02-06 DIAGNOSIS — M8580 Other specified disorders of bone density and structure, unspecified site: Secondary | ICD-10-CM | POA: Diagnosis not present

## 2023-02-06 DIAGNOSIS — N3010 Interstitial cystitis (chronic) without hematuria: Secondary | ICD-10-CM | POA: Diagnosis not present

## 2023-02-06 DIAGNOSIS — J309 Allergic rhinitis, unspecified: Secondary | ICD-10-CM | POA: Diagnosis not present

## 2023-02-06 DIAGNOSIS — M1990 Unspecified osteoarthritis, unspecified site: Secondary | ICD-10-CM | POA: Diagnosis not present

## 2023-02-06 DIAGNOSIS — R059 Cough, unspecified: Secondary | ICD-10-CM | POA: Diagnosis not present

## 2023-02-06 DIAGNOSIS — F39 Unspecified mood [affective] disorder: Secondary | ICD-10-CM | POA: Diagnosis not present

## 2023-02-06 DIAGNOSIS — R7301 Impaired fasting glucose: Secondary | ICD-10-CM | POA: Diagnosis not present

## 2023-02-06 DIAGNOSIS — F411 Generalized anxiety disorder: Secondary | ICD-10-CM | POA: Diagnosis not present

## 2023-02-06 DIAGNOSIS — I779 Disorder of arteries and arterioles, unspecified: Secondary | ICD-10-CM | POA: Diagnosis not present

## 2023-02-06 DIAGNOSIS — I129 Hypertensive chronic kidney disease with stage 1 through stage 4 chronic kidney disease, or unspecified chronic kidney disease: Secondary | ICD-10-CM | POA: Diagnosis not present

## 2023-02-09 ENCOUNTER — Ambulatory Visit: Payer: Medicare Other | Attending: Vascular Surgery | Admitting: Speech Pathology

## 2023-02-09 DIAGNOSIS — R498 Other voice and resonance disorders: Secondary | ICD-10-CM | POA: Diagnosis not present

## 2023-02-09 NOTE — Therapy (Signed)
OUTPATIENT SPEECH LANGUAGE PATHOLOGY EVALUATION   Patient Name: Amber Barry MRN: [id number] DOB:[birth year], 82 y.o., female Today's Date: 02/10/2023  PCP: Blair Heys, MD REFERRING PROVIDER: Maeola Harman MD  END OF SESSION:  End of Session - 02/09/23 0938     Visit Number 1    Number of Visits 9    Date for SLP Re-Evaluation 03/13/23    SLP Start Time 0930    SLP Stop Time  1015    SLP Time Calculation (min) 45 min    Activity Tolerance Patient tolerated treatment well             Past Medical History:  Diagnosis Date   Anemia    pt denies   Anxiety    Arthritis    Osteoarthritis   Bursitis    left hip-had injection 2015   Carotid artery occlusion 11/2012   Left Bruit, 80% blockage   CKD (chronic kidney disease)    Stage III   Diverticulitis    Hematuria    Hypertension    Interstitial cystitis    Lower back pain 01/2014   Ocular migraine    Osteopenia    PONV (postoperative nausea and vomiting)    Past Surgical History:  Procedure Laterality Date   ENDARTERECTOMY Right 12/22/2012   Procedure: Right Carotid Endarterectomy with hemashield patch angioplasty;  Surgeon: Pryor Ochoa, MD;  Location: [facility] OR;  Service: Vascular;  Laterality: Right;   ENDARTERECTOMY Right 01/13/2023   Procedure: RIGHT ENDARTERECTOMY COMMON CAROTID;  Surgeon: Maeola Harman, MD;  Location: [facility] OR;  Service: Vascular;  Laterality: Right;   PATCH ANGIOPLASTY Right 01/13/2023   Procedure: PATCH ANGIOPLASTY USING HEMASHIELD PLATINUM PATCH 0.8CM X 7.6CM;  Surgeon: Maeola Harman, MD;  Location: [facility] OR;  Service: Vascular;  Laterality: Right;   TONSILLECTOMY  1947   Patient Active Problem List   Diagnosis Date Noted   Carotid stenosis 01/13/2023   Asymptomatic carotid artery stenosis without infarction, right 01/13/2023   Hypertension 08/12/2018   Osteopenia 08/12/2018   Unspecified constipation 12/05/2013   Occlusion and stenosis of carotid artery  without mention of cerebral infarction 11/30/2012    ONSET DATE: Referred on 02/04/23   REFERRING DIAG: "cough after carotid surgery" (No diagnosis code was provided)  THERAPY DIAG:  Other voice and resonance disorders  Rationale for Evaluation and Treatment: Rehabilitation  SUBJECTIVE:   SUBJECTIVE STATEMENT: Pt was pleasant and cooperative throughout evaluation.   Pt accompanied by: self  PERTINENT HISTORY: R Carotid endarterectomy <10 years ago,  R Carotid endarterectomy 01/13/23  PAIN:  Are you having pain? No  FALLS: Has patient fallen in last 6 months?  No  LIVING ENVIRONMENT: Lives with: lives alone Lives in: House/apartment  PLOF:  Level of assistance: Independent with ADLs, Independent with IADLs Employment: Retired; taught piano   PATIENT GOALS: reduce cough  OBJECTIVE:   DIAGNOSTIC FINDINGS: NA  COGNITION: Overall cognitive status: Within functional limits for tasks assessed   MOTOR SPEECH: Overall motor speech: impaired Level of impairment: Conversation Respiration: clavicular breathing Phonation:  mildly hoarse Resonance: WFL Articulation: Appears intact Intelligibility: Intelligible Motor planning: Appears intact  COUGH: Coughing and throat clearing during the 45-minute eval occurred 15 times. See pt case hx in Clinical Impression.   Uses: cough drops, tessalon perles Potential Trigger: Speaking, allergies  PATIENT REPORTED OUTCOME MEASURES (PROM): Cough Severity Index: 6/40 (indicating cough is impacting QOL (<3 considered "WFL")   [place] Laryngeal Hypersensitivity Questionnaire: 44   TODAY'S TREATMENT:  DATE:    PATIENT EDUCATION: Education details: Irritable Larynx post surgery Person educated: Patient Education method: Explanation Education comprehension: verbalized  understanding   GOALS: Goals reviewed with patient? Yes  SHORT TERM GOALS: Target date: 03/13/23  Pt will report decreased coughing/throat clearing with use of cough suppression strategies. Baseline: Goal status: INITIAL  2.  Pt will demonstrate use of SOVT exercises to help divert tension from the larynx independently.  Baseline:  Goal status: INITIAL  3.  Pt will report use of relaxation strategies to decrease suspected laryngeal tension independently. Baseline:  Goal status: INITIAL    ASSESSMENT:  CLINICAL IMPRESSION: Pt is a 82 yo female who presents to ST OP for evaluation post carotid endarterectomy with complaints of continued cough. Pt reports cough began 1 week post surgery. She is unsure if the tessalon perles are working to contain cough at this time because she has been taking them consistently. Pt did not endorse any swallowing difficulty. Pt had difficulty reporting on voice changes; SLP noted pt's voice could be considered mildly hoarse. Pt reports following sx re: drainage, chest tightness when coughing, feeling like coughing when speaking, tension in neck area, feeling a need to cough/clear throat, increased reflux post surgery. Pt also reports she struggles with anxiety; however, she is on medication. When asked, pt reports drinking ~1 glass of water on average per day. Pt endorses embarrassment with coughing when she is in public places, specifically church. 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 With her carotid surgery 10 years ago, pt reports she did not have any difficulty postop.  SLP briefly edu on substitution behaviors, as pt shared she was going out to lunch with her friends and was nervous about her cough. SLP suggested taking a sip of water whenever she feels she needs to cough. Will continue with these strategies next session. SLP rec skilled ST services to address suspected laryngeal hypersensitivity post op. If  hoarseness continues or worsens, SLP rec consult to ENT/laryngologist to r/o laryngeal pathology.    OBJECTIVE IMPAIRMENTS: include voice disorder. These impairments are limiting patient from effectively communicating at home and in community. Factors affecting potential to achieve goals and functional outcome are  NA . Patient will benefit from skilled SLP services to address above impairments and improve overall function.  REHAB POTENTIAL: Good  PLAN:  SLP FREQUENCY: 1-2x/week  SLP DURATION: 4 weeks  PLANNED INTERVENTIONS: Environmental controls, Cueing hierachy, Internal/external aids, Functional tasks, SLP instruction and feedback, Compensatory strategies, and Patient/family education    [place], CCC-SLP 02/10/2023, 10:07 AM

## 2023-02-11 DIAGNOSIS — H40053 Ocular hypertension, bilateral: Secondary | ICD-10-CM | POA: Diagnosis not present

## 2023-02-11 DIAGNOSIS — H25013 Cortical age-related cataract, bilateral: Secondary | ICD-10-CM | POA: Diagnosis not present

## 2023-02-11 DIAGNOSIS — H2513 Age-related nuclear cataract, bilateral: Secondary | ICD-10-CM | POA: Diagnosis not present

## 2023-02-13 ENCOUNTER — Ambulatory Visit: Payer: Medicare Other | Admitting: Speech Pathology

## 2023-02-13 ENCOUNTER — Encounter: Payer: Self-pay | Admitting: Speech Pathology

## 2023-02-13 DIAGNOSIS — R498 Other voice and resonance disorders: Secondary | ICD-10-CM | POA: Diagnosis not present

## 2023-02-13 NOTE — Therapy (Signed)
OUTPATIENT SPEECH LANGUAGE PATHOLOGY TREATMENT NOTE   Patient Name: Amber Barry MRN: [id number] DOB:1941/06/20, 82 y.o., female Today's Date: 02/13/2023  PCP: Blair Heys, MD REFERRING PROVIDER: Maeola Harman MD  END OF SESSION:   End of Session - 02/13/23 1018     Visit Number 2    Number of Visits 9    Date for SLP Re-Evaluation 03/13/23    SLP Start Time 1015    SLP Stop Time  1055    SLP Time Calculation (min) 40 min    Activity Tolerance Patient tolerated treatment well             Past Medical History:  Diagnosis Date   Anemia    pt denies   Anxiety    Arthritis    Osteoarthritis   Bursitis    left hip-had injection 2015   Carotid artery occlusion 11/2012   Left Bruit, 80% blockage   CKD (chronic kidney disease)    Stage III   Diverticulitis    Hematuria    Hypertension    Interstitial cystitis    Lower back pain 01/2014   Ocular migraine    Osteopenia    PONV (postoperative nausea and vomiting)    Past Surgical History:  Procedure Laterality Date   ENDARTERECTOMY Right 12/22/2012   Procedure: Right Carotid Endarterectomy with hemashield patch angioplasty;  Surgeon: Pryor Ochoa, MD;  Location: [facility] OR;  Service: Vascular;  Laterality: Right;   ENDARTERECTOMY Right 01/13/2023   Procedure: RIGHT ENDARTERECTOMY COMMON CAROTID;  Surgeon: Maeola Harman, MD;  Location: [facility] OR;  Service: Vascular;  Laterality: Right;   PATCH ANGIOPLASTY Right 01/13/2023   Procedure: PATCH ANGIOPLASTY USING HEMASHIELD PLATINUM PATCH 0.8CM X 7.6CM;  Surgeon: Maeola Harman, MD;  Location: [facility] OR;  Service: Vascular;  Laterality: Right;   TONSILLECTOMY  1947   Patient Active Problem List   Diagnosis Date Noted   Carotid stenosis 01/13/2023   Asymptomatic carotid artery stenosis without infarction, right 01/13/2023   Hypertension 08/12/2018   Osteopenia 08/12/2018   Unspecified constipation 12/05/2013   Occlusion and stenosis of carotid  artery without mention of cerebral infarction 11/30/2012      ONSET DATE: Referred on 02/04/23    REFERRING DIAG: "cough after carotid surgery" (No diagnosis code was provided)  THERAPY DIAG:  Other voice and resonance disorders  Rationale for Evaluation and Treatment: Rehabilitation  SUBJECTIVE:   SUBJECTIVE STATEMENT: "I don't know exactly what I feel"   PAIN:  Are you having pain? Yes NPRS scale: 8/10 Pain location: L Hip Pain orientation: Left  PAIN TYPE: sharp and "locks up" Pain description: sharp  Aggravating factors: walking Relieving factors: no   OBJECTIVE:   TODAY'S TREATMENT:  DATE:   02/13/23: Pt was seen for skilled ST services with focus on cough supression strategies. Pt reports she has not taken any tessalon perles today, but is unsure if she is coughing any less.  Pt reports she has been attempting swallow replacement strategies and that they have helped some.   Pt coughed/throat cleared x17 in 45 min session. No attempts were made to swallow prior to either episode. SLP suggested we focus on identifying the "feeling" she feels prior to coughing to increase awareness. Pt has difficulty describing; however, with prompting, was able to report that it feels "like there is something in there". She also reports her throat feels dry sometimes. SLP encouraged pt to sit for 15 minutes, focus on the feeling she gets prior to coughing, and swallow prior to if she is able. SLP suspects focused practice may be helpful to increase ability to complete replacement strategy successfully. When pt looked visibly like she was going to cough (taking a deep breath in), SLP attempted to intervene with a cue to swallow. This was successful x2 reportedly.   Began edu on relaxation exercises. Pt required maxA verbal and tactile cues to complete  abdominal breathing. To cont.   PATIENT EDUCATION: Education details: Irritable Larynx post surgery Person educated: Patient Education method: Explanation Education comprehension: verbalized understanding     GOALS: Goals reviewed with patient? Yes   SHORT TERM GOALS: Target date: 03/13/23   Pt will report decreased coughing/throat clearing with use of cough suppression strategies. Baseline: Goal status: INITIAL   2.  Pt will demonstrate use of SOVT exercises to help divert tension from the larynx independently.  Baseline:  Goal status: INITIAL   3.  Pt will report use of relaxation strategies to decrease suspected laryngeal tension independently. Baseline:  Goal status: INITIAL       ASSESSMENT:   CLINICAL IMPRESSION: Pt is a 82 yo female who presents to ST OP for evaluation post carotid endarterectomy with complaints of continued cough. Pt reports cough began 1 week post surgery. She is unsure if the tessalon perles are working to contain cough at this time because she has been taking them consistently. Pt did not endorse any swallowing difficulty. Pt had difficulty reporting on voice changes; SLP noted pt's voice could be considered mildly hoarse. Pt reports following sx re: drainage, chest tightness when coughing, feeling like coughing when speaking, tension in neck area, feeling a need to cough/clear throat, increased reflux post surgery. Pt also reports she struggles with anxiety; however, she is on medication. When asked, pt reports drinking ~1 glass of water on average per day. Pt endorses embarrassment with coughing when she is in public places, specifically church. 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 With her carotid surgery 10 years ago, pt reports she did not have any difficulty postop.  SLP briefly edu on substitution behaviors, as pt shared she was going out to lunch with her friends and was nervous about her cough. SLP  suggested taking a sip of water whenever she feels she needs to cough. Will continue with these strategies next session. SLP rec skilled ST services to address suspected laryngeal hypersensitivity post op. If hoarseness continues or worsens, SLP rec consult to ENT/laryngologist to r/o laryngeal pathology.      OBJECTIVE IMPAIRMENTS: include voice disorder. These impairments are limiting patient from effectively communicating at home and in community. Factors affecting potential to achieve goals and functional outcome are  NA .  Patient will benefit from skilled SLP services to address above impairments and improve overall function.   REHAB POTENTIAL: Good   PLAN:   SLP FREQUENCY: 1-2x/week   SLP DURATION: 4 weeks   PLANNED INTERVENTIONS: Environmental controls, Cueing hierachy, Internal/external aids, Functional tasks, SLP instruction and feedback, Compensatory strategies, and Patient/family education         [place], CCC-SLP 02/13/2023, 10:19 AM

## 2023-02-16 ENCOUNTER — Ambulatory Visit: Payer: Medicare Other | Admitting: Speech Pathology

## 2023-02-16 ENCOUNTER — Encounter: Payer: Self-pay | Admitting: Speech Pathology

## 2023-02-16 DIAGNOSIS — R498 Other voice and resonance disorders: Secondary | ICD-10-CM | POA: Diagnosis not present

## 2023-02-16 NOTE — Therapy (Signed)
OUTPATIENT SPEECH LANGUAGE PATHOLOGY TREATMENT NOTE   Patient Name: Amber Barry MRN: [id number] DOB:04/30/1941, 82 y.o., female Today's Date: 02/13/2023  PCP: Blair Heys, MD REFERRING PROVIDER: Maeola Harman MD  END OF SESSION:   End of Session - 02/13/23 1018     Visit Number 2    Number of Visits 9    Date for SLP Re-Evaluation 03/13/23    SLP Start Time 1015    SLP Stop Time  1055    SLP Time Calculation (min) 40 min    Activity Tolerance Patient tolerated treatment well             Past Medical History:  Diagnosis Date   Anemia    pt denies   Anxiety    Arthritis    Osteoarthritis   Bursitis    left hip-had injection 2015   Carotid artery occlusion 11/2012   Left Bruit, 80% blockage   CKD (chronic kidney disease)    Stage III   Diverticulitis    Hematuria    Hypertension    Interstitial cystitis    Lower back pain 01/2014   Ocular migraine    Osteopenia    PONV (postoperative nausea and vomiting)    Past Surgical History:  Procedure Laterality Date   ENDARTERECTOMY Right 12/22/2012   Procedure: Right Carotid Endarterectomy with hemashield patch angioplasty;  Surgeon: Pryor Ochoa, MD;  Location: [facility] OR;  Service: Vascular;  Laterality: Right;   ENDARTERECTOMY Right 01/13/2023   Procedure: RIGHT ENDARTERECTOMY COMMON CAROTID;  Surgeon: Maeola Harman, MD;  Location: [facility] OR;  Service: Vascular;  Laterality: Right;   PATCH ANGIOPLASTY Right 01/13/2023   Procedure: PATCH ANGIOPLASTY USING HEMASHIELD PLATINUM PATCH 0.8CM X 7.6CM;  Surgeon: Maeola Harman, MD;  Location: [facility] OR;  Service: Vascular;  Laterality: Right;   TONSILLECTOMY  1947   Patient Active Problem List   Diagnosis Date Noted   Carotid stenosis 01/13/2023   Asymptomatic carotid artery stenosis without infarction, right 01/13/2023   Hypertension 08/12/2018   Osteopenia 08/12/2018   Unspecified constipation 12/05/2013   Occlusion and stenosis of carotid  artery without mention of cerebral infarction 11/30/2012      ONSET DATE: Referred on 02/04/23    REFERRING DIAG: "cough after carotid surgery" (No diagnosis code was provided)  THERAPY DIAG:  Other voice and resonance disorders  Rationale for Evaluation and Treatment: Rehabilitation  SUBJECTIVE:   SUBJECTIVE STATEMENT: "I don't know exactly what I feel"   PAIN:  Are you having pain? Yes NPRS scale: 8/10 Pain location: L Hip Pain orientation: Left  PAIN TYPE: sharp and "locks up" Pain description: sharp  Aggravating factors: walking Relieving factors: no   OBJECTIVE:   TODAY'S TREATMENT:  DATE:   02/16/23: Pt was seen for skilled ST services with focus on cough suppression strategies. Pt reports she has not been taking any tessalon perles and has noticed no difference between taking vs not taking. Pt coughed/throat cleared x15 in 45 min session. No attempts were made to swallow prior to.  Swallow replacement strategies  -Pt reports when sitting silently (not speaking) she is able to catch her cough/throat clear and use a replacement strategy ~50% of the time. She is becoming more aware of the sensation prior to cough/throat clear.  **reports she has noticed scents and change in temperature (cold/fans on) are cough triggers (this has been happening PRIOR to surgery)   Reports she attempted on belly breathing but caused cough so stopped.   Performed stretching re: seated cervical side bending. Provided handout to pt. Pt has extremely decreased neck ROM. Did not want PT at this time. To provide additional stretches to decrease tension in head/neck/laryngeal areas next session.   Pt required maxA for abdominal breathing. Pt had difficulty relaxing tension in all muscles and coordinating for respiration. SLP provided video, visual cues, and  tactile cues for support. To cont.     02/13/23: Pt was seen for skilled ST services with focus on cough suppression strategies. Pt reports she has not taken any tessalon perles today, but is unsure if she is coughing any less.  Pt reports she has been attempting swallow replacement strategies and that they have helped some.   Pt coughed/throat cleared x17 in 45 min session. No attempts were made to swallow prior to either episode. SLP suggested we focus on identifying the "feeling" she feels prior to coughing to increase awareness. Pt has difficulty describing; however, with prompting, was able to report that it feels "like there is something in there". She also reports her throat feels dry sometimes. SLP encouraged pt to sit for 15 minutes, focus on the feeling she gets prior to coughing, and swallow prior to if she is able. SLP suspects focused practice may be helpful to increase ability to complete replacement strategy successfully. When pt looked visibly like she was going to cough (taking a deep breath in), SLP attempted to intervene with a cue to swallow. This was successful x2 reportedly.   Began edu on relaxation exercises. Pt required maxA verbal and tactile cues to complete abdominal breathing. To cont.   PATIENT EDUCATION: Education details: Irritable Larynx post surgery Person educated: Patient Education method: Explanation Education comprehension: verbalized understanding     GOALS: Goals reviewed with patient? Yes   SHORT TERM GOALS: Target date: 03/13/23   Pt will report decreased coughing/throat clearing with use of cough suppression strategies. Baseline: Goal status: INITIAL   2.  Pt will demonstrate use of SOVT exercises to help divert tension from the larynx independently.  Baseline:  Goal status: INITIAL   3.  Pt will report use of relaxation strategies to decrease suspected laryngeal tension independently. Baseline:  Goal status: INITIAL       ASSESSMENT:    CLINICAL IMPRESSION: Pt is a 82 yo female who presents to ST OP for evaluation post carotid endarterectomy with complaints of continued cough. Pt reports cough began 1 week post surgery. She is unsure if the tessalon perles are working to contain cough at this time because she has been taking them consistently. Pt did not endorse any swallowing difficulty. Pt had difficulty reporting on voice changes; SLP noted pt's voice could be considered mildly hoarse. Pt reports following sx re:  drainage, chest tightness when coughing, feeling like coughing when speaking, tension in neck area, feeling a need to cough/clear throat, increased reflux post surgery. Pt also reports she struggles with anxiety; however, she is on medication. When asked, pt reports drinking ~1 glass of water on average per day. Pt endorses embarrassment with coughing when she is in public places, specifically church. 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 With her carotid surgery 10 years ago, pt reports she did not have any difficulty postop.  SLP briefly edu on substitution behaviors, as pt shared she was going out to lunch with her friends and was nervous about her cough. SLP suggested taking a sip of water whenever she feels she needs to cough. Will continue with these strategies next session. SLP rec skilled ST services to address suspected laryngeal hypersensitivity post op. If hoarseness continues or worsens, SLP rec consult to ENT/laryngologist to r/o laryngeal pathology.      OBJECTIVE IMPAIRMENTS: include voice disorder. These impairments are limiting patient from effectively communicating at home and in community. Factors affecting potential to achieve goals and functional outcome are  NA . Patient will benefit from skilled SLP services to address above impairments and improve overall function.   REHAB POTENTIAL: Good   PLAN:   SLP FREQUENCY: 1-2x/week   SLP DURATION: 4 weeks    PLANNED INTERVENTIONS: Environmental controls, Cueing hierachy, Internal/external aids, Functional tasks, SLP instruction and feedback, Compensatory strategies, and Patient/family education         [place], CCC-SLP 02/13/2023, 10:19 AM

## 2023-02-18 DIAGNOSIS — M25552 Pain in left hip: Secondary | ICD-10-CM | POA: Diagnosis not present

## 2023-02-19 ENCOUNTER — Ambulatory Visit: Payer: Medicare Other | Admitting: Speech Pathology

## 2023-02-19 ENCOUNTER — Encounter: Payer: Self-pay | Admitting: Speech Pathology

## 2023-02-19 DIAGNOSIS — R498 Other voice and resonance disorders: Secondary | ICD-10-CM

## 2023-02-19 NOTE — Therapy (Signed)
OUTPATIENT SPEECH LANGUAGE PATHOLOGY TREATMENT NOTE   Patient Name: Amber Barry MRN: [id number] DOB:1941/05/17, 82 y.o., female Today's Date: 02/19/2023  PCP: Blair Heys, MD REFERRING PROVIDER: Maeola Harman MD  END OF SESSION:   End of Session - 02/19/23 1019     Visit Number 4    Number of Visits 9    Date for SLP Re-Evaluation 03/13/23    SLP Start Time 1017    SLP Stop Time  1057    SLP Time Calculation (min) 40 min    Activity Tolerance Patient tolerated treatment well             Past Medical History:  Diagnosis Date   Anemia    pt denies   Anxiety    Arthritis    Osteoarthritis   Bursitis    left hip-had injection 2015   Carotid artery occlusion 11/2012   Left Bruit, 80% blockage   CKD (chronic kidney disease)    Stage III   Diverticulitis    Hematuria    Hypertension    Interstitial cystitis    Lower back pain 01/2014   Ocular migraine    Osteopenia    PONV (postoperative nausea and vomiting)    Past Surgical History:  Procedure Laterality Date   ENDARTERECTOMY Right 12/22/2012   Procedure: Right Carotid Endarterectomy with hemashield patch angioplasty;  Surgeon: Pryor Ochoa, MD;  Location: [facility] OR;  Service: Vascular;  Laterality: Right;   ENDARTERECTOMY Right 01/13/2023   Procedure: RIGHT ENDARTERECTOMY COMMON CAROTID;  Surgeon: Maeola Harman, MD;  Location: [facility] OR;  Service: Vascular;  Laterality: Right;   PATCH ANGIOPLASTY Right 01/13/2023   Procedure: PATCH ANGIOPLASTY USING HEMASHIELD PLATINUM PATCH 0.8CM X 7.6CM;  Surgeon: Maeola Harman, MD;  Location: [facility] OR;  Service: Vascular;  Laterality: Right;   TONSILLECTOMY  1947   Patient Active Problem List   Diagnosis Date Noted   Carotid stenosis 01/13/2023   Asymptomatic carotid artery stenosis without infarction, right 01/13/2023   Hypertension 08/12/2018   Osteopenia 08/12/2018   Unspecified constipation 12/05/2013   Occlusion and stenosis of carotid  artery without mention of cerebral infarction 11/30/2012      ONSET DATE: Referred on 02/04/23    REFERRING DIAG: "cough after carotid surgery" (No diagnosis code was provided)  THERAPY DIAG:  Other voice and resonance disorders  Rationale for Evaluation and Treatment: Rehabilitation  SUBJECTIVE:   SUBJECTIVE STATEMENT: "My friend noticed I have been coughing less."  PAIN:  Are you having pain? Yes NPRS scale: 8/10 Pain location: L Hip Pain orientation: Left  PAIN TYPE: sharp and "locks up" Pain description: sharp  Aggravating factors: walking Relieving factors: no   OBJECTIVE:   TODAY'S TREATMENT:  DATE:   02/19/23: Pt was seen for skilled ST services with focus on cough suppression strategies.  Patient reported several people noticing her coughing less throughout the day.  Patient reported she has been using the swallow strategy which has been extremely helpful.  She reports that she does have more difficulty "catching" the throat clear.   Patient was observed to throat clear x5 and cough x5 in today's session. SLP developed and edu on stretching plan for patient to assist with decreasing tension (SLP did rec pt seek PT to assist with increase cervical mobility - pt declined at this time).  The following exercises were completed re:  Diaphragmatic Breathing x10  Inhale: smell a flower Exhale: blow out a candle  **Stomach should be filling up like a balloon on inhale.   Shoulder rolls Front to back x10  Head/chin down hold x15 sec  Side to side rolls x10   Ear to shoulder hold (3x)  R ear down, left arm out and hold (15 sec)  L ear down, right arm out and hold (15 sec)  Jaw massage x20 x2 Forward motion  Cow chews x10  Yawn-sigh x10  Pt required mod-max A verbal and tactile cues to complete each exercise.   02/16/23: Pt was  seen for skilled ST services with focus on cough suppression strategies. Pt reports she has not been taking any tessalon perles and has noticed no difference between taking vs not taking. Pt coughed/throat cleared x15 in 45 min session. No attempts were made to swallow prior to.  Swallow replacement strategies  -Pt reports when sitting silently (not speaking) she is able to catch her cough/throat clear and use a replacement strategy ~50% of the time. She is becoming more aware of the sensation prior to cough/throat clear.  **reports she has noticed scents and change in temperature (cold/fans on) are cough triggers (this has been happening PRIOR to surgery)   Reports she attempted on belly breathing but caused cough so stopped.   Performed stretching re: seated cervical side bending. Provided handout to pt. Pt has extremely decreased neck ROM. Did not want PT at this time. To provide additional stretches to decrease tension in head/neck/laryngeal areas next session.   Pt required maxA for abdominal breathing. Pt had difficulty relaxing tension in all muscles and coordinating for respiration. SLP provided video, visual cues, and tactile cues for support. To cont.     02/13/23: Pt was seen for skilled ST services with focus on cough suppression strategies. Pt reports she has not taken any tessalon perles today, but is unsure if she is coughing any less.  Pt reports she has been attempting swallow replacement strategies and that they have helped some.   Pt coughed/throat cleared x17 in 45 min session. No attempts were made to swallow prior to either episode. SLP suggested we focus on identifying the "feeling" she feels prior to coughing to increase awareness. Pt has difficulty describing; however, with prompting, was able to report that it feels "like there is something in there". She also reports her throat feels dry sometimes. SLP encouraged pt to sit for 15 minutes, focus on the feeling she gets prior  to coughing, and swallow prior to if she is able. SLP suspects focused practice may be helpful to increase ability to complete replacement strategy successfully. When pt looked visibly like she was going to cough (taking a deep breath in), SLP attempted to intervene with a cue to swallow. This was successful x2 reportedly.  Began edu on relaxation exercises. Pt required maxA verbal and tactile cues to complete abdominal breathing. To cont.   PATIENT EDUCATION: Education details: Irritable Larynx post surgery Person educated: Patient Education method: Explanation Education comprehension: verbalized understanding     GOALS: Goals reviewed with patient? Yes   SHORT TERM GOALS: Target date: 03/13/23   Pt will report decreased coughing/throat clearing with use of cough suppression strategies. Baseline: Goal status: INITIAL   2.  Pt will demonstrate use of SOVT exercises to help divert tension from the larynx independently.  Baseline:  Goal status: INITIAL   3.  Pt will report use of relaxation strategies to decrease suspected laryngeal tension independently. Baseline:  Goal status: INITIAL       ASSESSMENT:   CLINICAL IMPRESSION: Pt is a 82 yo female who presents to ST OP for evaluation post carotid endarterectomy with complaints of continued cough. Pt reports cough began 1 week post surgery. She is unsure if the tessalon perles are working to contain cough at this time because she has been taking them consistently. Pt did not endorse any swallowing difficulty. Pt had difficulty reporting on voice changes; SLP noted pt's voice could be considered mildly hoarse. Pt reports following sx re: drainage, chest tightness when coughing, feeling like coughing when speaking, tension in neck area, feeling a need to cough/clear throat, increased reflux post surgery. Pt also reports she struggles with anxiety; however, she is on medication. When asked, pt reports drinking ~1 glass of water on average  per day. Pt endorses embarrassment with coughing when she is in public places, specifically church. 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 With her carotid surgery 10 years ago, pt reports she did not have any difficulty postop.  SLP briefly edu on substitution behaviors, as pt shared she was going out to lunch with her friends and was nervous about her cough. SLP suggested taking a sip of water whenever she feels she needs to cough. Will continue with these strategies next session. SLP rec skilled ST services to address suspected laryngeal hypersensitivity post op. If hoarseness continues or worsens, SLP rec consult to ENT/laryngologist to r/o laryngeal pathology.      OBJECTIVE IMPAIRMENTS: include voice disorder. These impairments are limiting patient from effectively communicating at home and in community. Factors affecting potential to achieve goals and functional outcome are  NA . Patient will benefit from skilled SLP services to address above impairments and improve overall function.   REHAB POTENTIAL: Good   PLAN:   SLP FREQUENCY: 1-2x/week   SLP DURATION: 4 weeks   PLANNED INTERVENTIONS: Environmental controls, Cueing hierachy, Internal/external aids, Functional tasks, SLP instruction and feedback, Compensatory strategies, and Patient/family education         [place], CCC-SLP 02/19/2023, 10:20 AM

## 2023-02-21 ENCOUNTER — Other Ambulatory Visit: Payer: Self-pay

## 2023-02-21 DIAGNOSIS — I6521 Occlusion and stenosis of right carotid artery: Secondary | ICD-10-CM

## 2023-02-21 DIAGNOSIS — R0989 Other specified symptoms and signs involving the circulatory and respiratory systems: Secondary | ICD-10-CM

## 2023-02-24 ENCOUNTER — Ambulatory Visit: Payer: Medicare Other | Admitting: Speech Pathology

## 2023-02-24 ENCOUNTER — Encounter: Payer: Self-pay | Admitting: Speech Pathology

## 2023-02-24 DIAGNOSIS — R498 Other voice and resonance disorders: Secondary | ICD-10-CM

## 2023-02-24 NOTE — Therapy (Signed)
OUTPATIENT SPEECH LANGUAGE PATHOLOGY TREATMENT NOTE   Patient Name: Amber Barry MRN: [id number] DOB:1941/02/06, 82 y.o., female Today's Date: 02/24/2023  PCP: Blair Heys, MD REFERRING PROVIDER: Maeola Harman MD  END OF SESSION:   End of Session - 02/24/23 1019     Visit Number 5    Number of Visits 9    Date for SLP Re-Evaluation 03/13/23    SLP Start Time 1017    SLP Stop Time  1100    SLP Time Calculation (min) 43 min    Activity Tolerance Patient tolerated treatment well             Past Medical History:  Diagnosis Date   Anemia    pt denies   Anxiety    Arthritis    Osteoarthritis   Bursitis    left hip-had injection 2015   Carotid artery occlusion 11/2012   Left Bruit, 80% blockage   CKD (chronic kidney disease)    Stage III   Diverticulitis    Hematuria    Hypertension    Interstitial cystitis    Lower back pain 01/2014   Ocular migraine    Osteopenia    PONV (postoperative nausea and vomiting)    Past Surgical History:  Procedure Laterality Date   ENDARTERECTOMY Right 12/22/2012   Procedure: Right Carotid Endarterectomy with hemashield patch angioplasty;  Surgeon: Pryor Ochoa, MD;  Location: [facility] OR;  Service: Vascular;  Laterality: Right;   ENDARTERECTOMY Right 01/13/2023   Procedure: RIGHT ENDARTERECTOMY COMMON CAROTID;  Surgeon: Maeola Harman, MD;  Location: [facility] OR;  Service: Vascular;  Laterality: Right;   PATCH ANGIOPLASTY Right 01/13/2023   Procedure: PATCH ANGIOPLASTY USING HEMASHIELD PLATINUM PATCH 0.8CM X 7.6CM;  Surgeon: Maeola Harman, MD;  Location: [facility] OR;  Service: Vascular;  Laterality: Right;   TONSILLECTOMY  1947   Patient Active Problem List   Diagnosis Date Noted   Carotid stenosis 01/13/2023   Asymptomatic carotid artery stenosis without infarction, right 01/13/2023   Hypertension 08/12/2018   Osteopenia 08/12/2018   Unspecified constipation 12/05/2013   Occlusion and stenosis of carotid  artery without mention of cerebral infarction 11/30/2012      ONSET DATE: Referred on 02/04/23    REFERRING DIAG: "cough after carotid surgery" (No diagnosis code was provided)  THERAPY DIAG:  Other voice and resonance disorders  Rationale for Evaluation and Treatment: Rehabilitation  SUBJECTIVE:   SUBJECTIVE STATEMENT: "I think I can talk more without having to cough."   PAIN:  Are you having pain? No    OBJECTIVE:   TODAY'S TREATMENT:                                                                                                                                          DATE:   02/24/23: Patient was seen for skilled ST services with focus on stretches, relaxation, and functional practice  of suppression strategies.  Patient reported she was unclear on how to complete shoulder rolls and jaw massage.  SLP demonstrated and instructed patient to demonstrate back.  Patient was able to complete independently by the end of the session.  Due to extremely reduced neck and back ROM, SLP recommends patient receive a PT referral.  To follow-up with doc.   Patient was observed to cough x4 while completing yawn-sigh exercise only and throat cleared x 4 throughout today's 45-minute session.  SLP encouraged patient to identify why she feels the need to throat clear.  SLP encouraged patient to think about the feeling of drainage in the back of her throat and swallow more when she is speaking BEFORE she feels the need to throat clear.  With SLP cues to swallow during a 15-minute conversation, patient did not throat clear once.  To continue with this next session.   02/19/23: Pt was seen for skilled ST services with focus on cough suppression strategies.  Patient reported several people noticing her coughing less throughout the day.  Patient reported she has been using the swallow strategy which has been extremely helpful.  She reports that she does have more difficulty "catching" the throat clear.    Patient was observed to throat clear x5 and cough x5 in today's session. SLP developed and edu on stretching plan for patient to assist with decreasing tension (SLP did rec pt seek PT to assist with increase cervical mobility - pt declined at this time).  The following exercises were completed re:  Diaphragmatic Breathing x10  Inhale: smell a flower Exhale: blow out a candle  **Stomach should be filling up like a balloon on inhale.   Shoulder rolls Front to back x10  Head/chin down hold x15 sec  Side to side rolls x10   Ear to shoulder hold (3x)  R ear down, left arm out and hold (15 sec)  L ear down, right arm out and hold (15 sec)  Jaw massage x20 x2 Forward motion  Cow chews x10  Yawn-sigh x10  Pt required mod-max A verbal and tactile cues to complete each exercise.   02/16/23: Pt was seen for skilled ST services with focus on cough suppression strategies. Pt reports she has not been taking any tessalon perles and has noticed no difference between taking vs not taking. Pt coughed/throat cleared x15 in 45 min session. No attempts were made to swallow prior to.  Swallow replacement strategies  -Pt reports when sitting silently (not speaking) she is able to catch her cough/throat clear and use a replacement strategy ~50% of the time. She is becoming more aware of the sensation prior to cough/throat clear.  **reports she has noticed scents and change in temperature (cold/fans on) are cough triggers (this has been happening PRIOR to surgery)   Reports she attempted on belly breathing but caused cough so stopped.   Performed stretching re: seated cervical side bending. Provided handout to pt. Pt has extremely decreased neck ROM. Did not want PT at this time. To provide additional stretches to decrease tension in head/neck/laryngeal areas next session.   Pt required maxA for abdominal breathing. Pt had difficulty relaxing tension in all muscles and coordinating for respiration. SLP  provided video, visual cues, and tactile cues for support. To cont.     02/13/23: Pt was seen for skilled ST services with focus on cough suppression strategies. Pt reports she has not taken any tessalon perles today, but is unsure if she is coughing any less.  Pt reports she has been attempting swallow replacement strategies and that they have helped some.   Pt coughed/throat cleared x17 in 45 min session. No attempts were made to swallow prior to either episode. SLP suggested we focus on identifying the "feeling" she feels prior to coughing to increase awareness. Pt has difficulty describing; however, with prompting, was able to report that it feels "like there is something in there". She also reports her throat feels dry sometimes. SLP encouraged pt to sit for 15 minutes, focus on the feeling she gets prior to coughing, and swallow prior to if she is able. SLP suspects focused practice may be helpful to increase ability to complete replacement strategy successfully. When pt looked visibly like she was going to cough (taking a deep breath in), SLP attempted to intervene with a cue to swallow. This was successful x2 reportedly.   Began edu on relaxation exercises. Pt required maxA verbal and tactile cues to complete abdominal breathing. To cont.   PATIENT EDUCATION: Education details: Irritable Larynx post surgery Person educated: Patient Education method: Explanation Education comprehension: verbalized understanding     GOALS: Goals reviewed with patient? Yes   SHORT TERM GOALS: Target date: 03/13/23   Pt will report decreased coughing/throat clearing with use of cough suppression strategies. Baseline: Goal status: INITIAL   2.  Pt will demonstrate use of SOVT exercises to help divert tension from the larynx independently.  Baseline:  Goal status: INITIAL   3.  Pt will report use of relaxation strategies to decrease suspected laryngeal tension independently. Baseline:  Goal status:  INITIAL       ASSESSMENT:   CLINICAL IMPRESSION: Pt is a 82 yo female who presents to ST OP for evaluation post carotid endarterectomy with complaints of continued cough. Pt reports cough began 1 week post surgery. She is unsure if the tessalon perles are working to contain cough at this time because she has been taking them consistently. Pt did not endorse any swallowing difficulty. Pt had difficulty reporting on voice changes; SLP noted pt's voice could be considered mildly hoarse. Pt reports following sx re: drainage, chest tightness when coughing, feeling like coughing when speaking, tension in neck area, feeling a need to cough/clear throat, increased reflux post surgery. Pt also reports she struggles with anxiety; however, she is on medication. When asked, pt reports drinking ~1 glass of water on average per day. Pt endorses embarrassment with coughing when she is in public places, specifically church. 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 With her carotid surgery 10 years ago, pt reports she did not have any difficulty postop.  SLP briefly edu on substitution behaviors, as pt shared she was going out to lunch with her friends and was nervous about her cough. SLP suggested taking a sip of water whenever she feels she needs to cough. Will continue with these strategies next session. SLP rec skilled ST services to address suspected laryngeal hypersensitivity post op. If hoarseness continues or worsens, SLP rec consult to ENT/laryngologist to r/o laryngeal pathology.      OBJECTIVE IMPAIRMENTS: include voice disorder. These impairments are limiting patient from effectively communicating at home and in community. Factors affecting potential to achieve goals and functional outcome are  NA . Patient will benefit from skilled SLP services to address above impairments and improve overall function.   REHAB POTENTIAL: Good   PLAN:   SLP FREQUENCY: 1-2x/week    SLP DURATION: 4 weeks  PLANNED INTERVENTIONS: Environmental controls, Cueing hierachy, Internal/external aids, Functional tasks, SLP instruction and feedback, Compensatory strategies, and Patient/family education         [place], CCC-SLP 02/24/2023, 10:21 AM

## 2023-02-26 ENCOUNTER — Ambulatory Visit: Payer: Medicare Other | Admitting: Speech Pathology

## 2023-03-02 ENCOUNTER — Ambulatory Visit: Payer: Medicare Other | Admitting: Speech Pathology

## 2023-03-02 ENCOUNTER — Encounter: Payer: Self-pay | Admitting: Speech Pathology

## 2023-03-02 DIAGNOSIS — R498 Other voice and resonance disorders: Secondary | ICD-10-CM | POA: Diagnosis not present

## 2023-03-02 NOTE — Therapy (Signed)
OUTPATIENT SPEECH LANGUAGE PATHOLOGY TREATMENT NOTE   Patient Name: Amber Barry MRN: [id number] DOB:1940/12/12, 82 y.o., female Today's Date: 03/02/2023  PCP: Blair Heys, MD REFERRING PROVIDER: Maeola Harman MD  END OF SESSION:   End of Session - 03/02/23 1024     Visit Number 6    Number of Visits 9    Date for SLP Re-Evaluation 03/13/23    SLP Start Time 1020    SLP Stop Time  1100    SLP Time Calculation (min) 40 min    Activity Tolerance Patient tolerated treatment well             Past Medical History:  Diagnosis Date   Anemia    pt denies   Anxiety    Arthritis    Osteoarthritis   Bursitis    left hip-had injection 2015   Carotid artery occlusion 11/2012   Left Bruit, 80% blockage   CKD (chronic kidney disease)    Stage III   Diverticulitis    Hematuria    Hypertension    Interstitial cystitis    Lower back pain 01/2014   Ocular migraine    Osteopenia    PONV (postoperative nausea and vomiting)    Past Surgical History:  Procedure Laterality Date   ENDARTERECTOMY Right 12/22/2012   Procedure: Right Carotid Endarterectomy with hemashield patch angioplasty;  Surgeon: Pryor Ochoa, MD;  Location: [facility] OR;  Service: Vascular;  Laterality: Right;   ENDARTERECTOMY Right 01/13/2023   Procedure: RIGHT ENDARTERECTOMY COMMON CAROTID;  Surgeon: Maeola Harman, MD;  Location: [facility] OR;  Service: Vascular;  Laterality: Right;   PATCH ANGIOPLASTY Right 01/13/2023   Procedure: PATCH ANGIOPLASTY USING HEMASHIELD PLATINUM PATCH 0.8CM X 7.6CM;  Surgeon: Maeola Harman, MD;  Location: [facility] OR;  Service: Vascular;  Laterality: Right;   TONSILLECTOMY  1947   Patient Active Problem List   Diagnosis Date Noted   Carotid stenosis 01/13/2023   Asymptomatic carotid artery stenosis without infarction, right 01/13/2023   Hypertension 08/12/2018   Osteopenia 08/12/2018   Unspecified constipation 12/05/2013   Occlusion and stenosis of carotid  artery without mention of cerebral infarction 11/30/2012      ONSET DATE: Referred on 02/04/23    REFERRING DIAG: "cough after carotid surgery" (No diagnosis code was provided)  THERAPY DIAG:  Other voice and resonance disorders  Rationale for Evaluation and Treatment: Rehabilitation  SUBJECTIVE:   SUBJECTIVE STATEMENT: "I think I can talk more without having to cough."   PAIN:  Are you having pain? No    OBJECTIVE:   TODAY'S TREATMENT:                                                                                                                                          DATE:   03/02/23: Patient was seen for skilled ST services with focus stretches, relaxation, and functional practice of  suppression strategies. Reviewed stretches with patient. Continues to have difficulty with abdominal breathing. SLP rec continued work on coordinating respiratory system for more efficient breath use.   Patient was observed to cough x6 this session. Coughing seemed to be triggered by a sudden inhale - when laughing, or speaking. SLP noted throat clears x4 throughout session. Pt continues to feel like she is making improvement. Pt is impacted by the feeling of post nasal drip. To continue with therapy to ensure pt feels comfortable with using strategies and completing stretching HEP. To discharge in 2 sessions. May discuss ENT referral to r/o any other impairments.   02/24/23: Patient was seen for skilled ST services with focus on stretches, relaxation, and functional practice of suppression strategies.  Patient reported she was unclear on how to complete shoulder rolls and jaw massage.  SLP demonstrated and instructed patient to demonstrate back.  Patient was able to complete independently by the end of the session.  Due to extremely reduced neck and back ROM, SLP recommends patient receive a PT referral.  To follow-up with doc.   Patient was observed to cough x4 while completing yawn-sigh exercise  only and throat cleared x 4 throughout today's 45-minute session.  SLP encouraged patient to identify why she feels the need to throat clear.  SLP encouraged patient to think about the feeling of drainage in the back of her throat and swallow more when she is speaking BEFORE she feels the need to throat clear.  With SLP cues to swallow during a 15-minute conversation, patient did not throat clear once.  To continue with this next session.   02/19/23: Pt was seen for skilled ST services with focus on cough suppression strategies.  Patient reported several people noticing her coughing less throughout the day.  Patient reported she has been using the swallow strategy which has been extremely helpful.  She reports that she does have more difficulty "catching" the throat clear.   Patient was observed to throat clear x5 and cough x5 in today's session. SLP developed and edu on stretching plan for patient to assist with decreasing tension (SLP did rec pt seek PT to assist with increase cervical mobility - pt declined at this time).  The following exercises were completed re:  Diaphragmatic Breathing x10  Inhale: smell a flower Exhale: blow out a candle  **Stomach should be filling up like a balloon on inhale.   Shoulder rolls Front to back x10  Head/chin down hold x15 sec  Side to side rolls x10   Ear to shoulder hold (3x)  R ear down, left arm out and hold (15 sec)  L ear down, right arm out and hold (15 sec)  Jaw massage x20 x2 Forward motion  Cow chews x10  Yawn-sigh x10  Pt required mod-max A verbal and tactile cues to complete each exercise.   02/16/23: Pt was seen for skilled ST services with focus on cough suppression strategies. Pt reports she has not been taking any tessalon perles and has noticed no difference between taking vs not taking. Pt coughed/throat cleared x15 in 45 min session. No attempts were made to swallow prior to.  Swallow replacement strategies  -Pt reports when  sitting silently (not speaking) she is able to catch her cough/throat clear and use a replacement strategy ~50% of the time. She is becoming more aware of the sensation prior to cough/throat clear.  **reports she has noticed scents and change in temperature (cold/fans on) are cough triggers (this has been  happening PRIOR to surgery)   Reports she attempted on belly breathing but caused cough so stopped.   Performed stretching re: seated cervical side bending. Provided handout to pt. Pt has extremely decreased neck ROM. Did not want PT at this time. To provide additional stretches to decrease tension in head/neck/laryngeal areas next session.   Pt required maxA for abdominal breathing. Pt had difficulty relaxing tension in all muscles and coordinating for respiration. SLP provided video, visual cues, and tactile cues for support. To cont.     02/13/23: Pt was seen for skilled ST services with focus on cough suppression strategies. Pt reports she has not taken any tessalon perles today, but is unsure if she is coughing any less.  Pt reports she has been attempting swallow replacement strategies and that they have helped some.   Pt coughed/throat cleared x17 in 45 min session. No attempts were made to swallow prior to either episode. SLP suggested we focus on identifying the "feeling" she feels prior to coughing to increase awareness. Pt has difficulty describing; however, with prompting, was able to report that it feels "like there is something in there". She also reports her throat feels dry sometimes. SLP encouraged pt to sit for 15 minutes, focus on the feeling she gets prior to coughing, and swallow prior to if she is able. SLP suspects focused practice may be helpful to increase ability to complete replacement strategy successfully. When pt looked visibly like she was going to cough (taking a deep breath in), SLP attempted to intervene with a cue to swallow. This was successful x2 reportedly.    Began edu on relaxation exercises. Pt required maxA verbal and tactile cues to complete abdominal breathing. To cont.   PATIENT EDUCATION: Education details: Irritable Larynx post surgery Person educated: Patient Education method: Explanation Education comprehension: verbalized understanding     GOALS: Goals reviewed with patient? Yes   SHORT TERM GOALS: Target date: 03/13/23   Pt will report decreased coughing/throat clearing with use of cough suppression strategies. Baseline: Goal status: INITIAL   2.  Pt will demonstrate use of SOVT exercises to help divert tension from the larynx independently.  Baseline:  Goal status: INITIAL   3.  Pt will report use of relaxation strategies to decrease suspected laryngeal tension independently. Baseline:  Goal status: INITIAL       ASSESSMENT:   CLINICAL IMPRESSION: Pt is a 81 yo female who presents to ST OP for evaluation post carotid endarterectomy with complaints of continued cough. Pt reports cough began 1 week post surgery. She is unsure if the tessalon perles are working to contain cough at this time because she has been taking them consistently. Pt did not endorse any swallowing difficulty. Pt had difficulty reporting on voice changes; SLP noted pt's voice could be considered mildly hoarse. Pt reports following sx re: drainage, chest tightness when coughing, feeling like coughing when speaking, tension in neck area, feeling a need to cough/clear throat, increased reflux post surgery. Pt also reports she struggles with anxiety; however, she is on medication. When asked, pt reports drinking ~1 glass of water on average per day. Pt endorses embarrassment with coughing when she is in public places, specifically church. 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 With her carotid surgery 10 years ago, pt reports she did not have any difficulty postop.  SLP briefly edu on substitution behaviors, as pt  shared she was going out to lunch with her friends  and was nervous about her cough. SLP suggested taking a sip of water whenever she feels she needs to cough. Will continue with these strategies next session. SLP rec skilled ST services to address suspected laryngeal hypersensitivity post op. If hoarseness continues or worsens, SLP rec consult to ENT/laryngologist to r/o laryngeal pathology.      OBJECTIVE IMPAIRMENTS: include voice disorder. These impairments are limiting patient from effectively communicating at home and in community. Factors affecting potential to achieve goals and functional outcome are  NA . Patient will benefit from skilled SLP services to address above impairments and improve overall function.   REHAB POTENTIAL: Good   PLAN:   SLP FREQUENCY: 1-2x/week   SLP DURATION: 4 weeks   PLANNED INTERVENTIONS: Environmental controls, Cueing hierachy, Internal/external aids, Functional tasks, SLP instruction and feedback, Compensatory strategies, and Patient/family education         [place], CCC-SLP 03/02/2023, 10:26 AM

## 2023-03-04 ENCOUNTER — Encounter: Payer: Self-pay | Admitting: Speech Pathology

## 2023-03-04 ENCOUNTER — Ambulatory Visit: Payer: Medicare Other | Admitting: Speech Pathology

## 2023-03-04 DIAGNOSIS — R498 Other voice and resonance disorders: Secondary | ICD-10-CM | POA: Diagnosis not present

## 2023-03-04 NOTE — Therapy (Signed)
OUTPATIENT SPEECH LANGUAGE PATHOLOGY TREATMENT NOTE   Patient Name: Amber Barry MRN: [id number] DOB:1941-06-09, 82 y.o., female Today's Date: 03/04/2023  PCP: Blair Heys, MD REFERRING PROVIDER: Maeola Harman MD  END OF SESSION:   End of Session - 03/04/23 0932     Visit Number 8    Number of Visits 9    Date for SLP Re-Evaluation 03/13/23    SLP Start Time 0930    SLP Stop Time  1010    SLP Time Calculation (min) 40 min    Activity Tolerance Patient tolerated treatment well             Past Medical History:  Diagnosis Date   Anemia    pt denies   Anxiety    Arthritis    Osteoarthritis   Bursitis    left hip-had injection 2015   Carotid artery occlusion 11/2012   Left Bruit, 80% blockage   CKD (chronic kidney disease)    Stage III   Diverticulitis    Hematuria    Hypertension    Interstitial cystitis    Lower back pain 01/2014   Ocular migraine    Osteopenia    PONV (postoperative nausea and vomiting)    Past Surgical History:  Procedure Laterality Date   ENDARTERECTOMY Right 12/22/2012   Procedure: Right Carotid Endarterectomy with hemashield patch angioplasty;  Surgeon: Pryor Ochoa, MD;  Location: [facility] OR;  Service: Vascular;  Laterality: Right;   ENDARTERECTOMY Right 01/13/2023   Procedure: RIGHT ENDARTERECTOMY COMMON CAROTID;  Surgeon: Maeola Harman, MD;  Location: [facility] OR;  Service: Vascular;  Laterality: Right;   PATCH ANGIOPLASTY Right 01/13/2023   Procedure: PATCH ANGIOPLASTY USING HEMASHIELD PLATINUM PATCH 0.8CM X 7.6CM;  Surgeon: Maeola Harman, MD;  Location: [facility] OR;  Service: Vascular;  Laterality: Right;   TONSILLECTOMY  1947   Patient Active Problem List   Diagnosis Date Noted   Carotid stenosis 01/13/2023   Asymptomatic carotid artery stenosis without infarction, right 01/13/2023   Hypertension 08/12/2018   Osteopenia 08/12/2018   Unspecified constipation 12/05/2013   Occlusion and stenosis of carotid  artery without mention of cerebral infarction 11/30/2012      ONSET DATE: Referred on 02/04/23    REFERRING DIAG: "cough after carotid surgery" (No diagnosis code was provided)  THERAPY DIAG:  Other voice and resonance disorders  Rationale for Evaluation and Treatment: Rehabilitation  SUBJECTIVE:   SUBJECTIVE STATEMENT: "I use cough drops occasionally."  PAIN:  Are you having pain? No    OBJECTIVE:   TODAY'S TREATMENT:                                                                                                                                          DATE:   03/04/23: Pt was seen for skilled ST services targeting chronic cough. SLP reviewed triggers with patient - sudden inhale/cold air/post nasal drip.  During today's session, pt participated in informal conversation using swallow strategy. Pt benefited from SLP cues to swallow occasionally when speaking and this seemed to be successful, as pt only generated 1 throat clear in 30 min conversation. Pt reports she does not want to participate in PT at this time, as she feels like she has no pain and doesn't want to mess that up. SLP reached out to PA for ENT referral to r/o any additional impairment as pt has made some improvement. SLP explained to pt and pt reported back understanding.  Pt reports continued use of cough drops occasionally before bed; however, now looks for the cough drops with minimal menthol. SLP to see next session to ensure pt feels comfortable with strategies and stretches. SLP to discharge and defer to ENT for follow up if warranted.   03/02/23: Patient was seen for skilled ST services with focus stretches, relaxation, and functional practice of suppression strategies. Reviewed stretches with patient. Continues to have difficulty with abdominal breathing. SLP rec continued work on coordinating respiratory system for more efficient breath use.   Patient was observed to cough x6 this session. Coughing seemed to be  triggered by a sudden inhale - when laughing, or speaking. SLP noted throat clears x4 throughout session. Pt continues to feel like she is making improvement. Pt is impacted by the feeling of post nasal drip. To continue with therapy to ensure pt feels comfortable with using strategies and completing stretching HEP. To discharge in 2 sessions. May discuss ENT referral to r/o any other impairments.   02/24/23: Patient was seen for skilled ST services with focus on stretches, relaxation, and functional practice of suppression strategies.  Patient reported she was unclear on how to complete shoulder rolls and jaw massage.  SLP demonstrated and instructed patient to demonstrate back.  Patient was able to complete independently by the end of the session.  Due to extremely reduced neck and back ROM, SLP recommends patient receive a PT referral.  To follow-up with doc.   Patient was observed to cough x4 while completing yawn-sigh exercise only and throat cleared x 4 throughout today's 45-minute session.  SLP encouraged patient to identify why she feels the need to throat clear.  SLP encouraged patient to think about the feeling of drainage in the back of her throat and swallow more when she is speaking BEFORE she feels the need to throat clear.  With SLP cues to swallow during a 15-minute conversation, patient did not throat clear once.  To continue with this next session.   02/19/23: Pt was seen for skilled ST services with focus on cough suppression strategies.  Patient reported several people noticing her coughing less throughout the day.  Patient reported she has been using the swallow strategy which has been extremely helpful.  She reports that she does have more difficulty "catching" the throat clear.   Patient was observed to throat clear x5 and cough x5 in today's session. SLP developed and edu on stretching plan for patient to assist with decreasing tension (SLP did rec pt seek PT to assist with increase  cervical mobility - pt declined at this time).  The following exercises were completed re:  Diaphragmatic Breathing x10  Inhale: smell a flower Exhale: blow out a candle  **Stomach should be filling up like a balloon on inhale.   Shoulder rolls Front to back x10  Head/chin down hold x15 sec  Side to side rolls x10   Ear to shoulder hold (3x)  R ear down, left arm out and hold (15 sec)  L ear down, right arm out and hold (15 sec)  Jaw massage x20 x2 Forward motion  Cow chews x10  Yawn-sigh x10  Pt required mod-max A verbal and tactile cues to complete each exercise.   02/16/23: Pt was seen for skilled ST services with focus on cough suppression strategies. Pt reports she has not been taking any tessalon perles and has noticed no difference between taking vs not taking. Pt coughed/throat cleared x15 in 45 min session. No attempts were made to swallow prior to.  Swallow replacement strategies  -Pt reports when sitting silently (not speaking) she is able to catch her cough/throat clear and use a replacement strategy ~50% of the time. She is becoming more aware of the sensation prior to cough/throat clear.  **reports she has noticed scents and change in temperature (cold/fans on) are cough triggers (this has been happening PRIOR to surgery)   Reports she attempted on belly breathing but caused cough so stopped.   Performed stretching re: seated cervical side bending. Provided handout to pt. Pt has extremely decreased neck ROM. Did not want PT at this time. To provide additional stretches to decrease tension in head/neck/laryngeal areas next session.   Pt required maxA for abdominal breathing. Pt had difficulty relaxing tension in all muscles and coordinating for respiration. SLP provided video, visual cues, and tactile cues for support. To cont.     02/13/23: Pt was seen for skilled ST services with focus on cough suppression strategies. Pt reports she has not taken any tessalon perles  today, but is unsure if she is coughing any less.  Pt reports she has been attempting swallow replacement strategies and that they have helped some.   Pt coughed/throat cleared x17 in 45 min session. No attempts were made to swallow prior to either episode. SLP suggested we focus on identifying the "feeling" she feels prior to coughing to increase awareness. Pt has difficulty describing; however, with prompting, was able to report that it feels "like there is something in there". She also reports her throat feels dry sometimes. SLP encouraged pt to sit for 15 minutes, focus on the feeling she gets prior to coughing, and swallow prior to if she is able. SLP suspects focused practice may be helpful to increase ability to complete replacement strategy successfully. When pt looked visibly like she was going to cough (taking a deep breath in), SLP attempted to intervene with a cue to swallow. This was successful x2 reportedly.   Began edu on relaxation exercises. Pt required maxA verbal and tactile cues to complete abdominal breathing. To cont.   PATIENT EDUCATION: Education details: Irritable Larynx post surgery Person educated: Patient Education method: Explanation Education comprehension: verbalized understanding     GOALS: Goals reviewed with patient? Yes   SHORT TERM GOALS: Target date: 03/13/23   Pt will report decreased coughing/throat clearing with use of cough suppression strategies. Baseline: Goal status: INITIAL   2.  Pt will demonstrate use of SOVT exercises to help divert tension from the larynx independently.  Baseline:  Goal status: INITIAL   3.  Pt will report use of relaxation strategies to decrease suspected laryngeal tension independently. Baseline:  Goal status: INITIAL       ASSESSMENT:   CLINICAL IMPRESSION: Pt is a 82 yo female who presents to ST OP for evaluation post carotid endarterectomy with complaints of continued cough. Pt reports cough began 1 week post  surgery. She is unsure  if the tessalon perles are working to contain cough at this time because she has been taking them consistently. Pt did not endorse any swallowing difficulty. Pt had difficulty reporting on voice changes; SLP noted pt's voice could be considered mildly hoarse. Pt reports following sx re: drainage, chest tightness when coughing, feeling like coughing when speaking, tension in neck area, feeling a need to cough/clear throat, increased reflux post surgery. Pt also reports she struggles with anxiety; however, she is on medication. When asked, pt reports drinking ~1 glass of water on average per day. Pt endorses embarrassment with coughing when she is in public places, specifically church. 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 With her carotid surgery 10 years ago, pt reports she did not have any difficulty postop.  SLP briefly edu on substitution behaviors, as pt shared she was going out to lunch with her friends and was nervous about her cough. SLP suggested taking a sip of water whenever she feels she needs to cough. Will continue with these strategies next session. SLP rec skilled ST services to address suspected laryngeal hypersensitivity post op. If hoarseness continues or worsens, SLP rec consult to ENT/laryngologist to r/o laryngeal pathology.      OBJECTIVE IMPAIRMENTS: include voice disorder. These impairments are limiting patient from effectively communicating at home and in community. Factors affecting potential to achieve goals and functional outcome are  NA . Patient will benefit from skilled SLP services to address above impairments and improve overall function.   REHAB POTENTIAL: Good   PLAN:   SLP FREQUENCY: 1-2x/week   SLP DURATION: 4 weeks   PLANNED INTERVENTIONS: Environmental controls, Cueing hierachy, Internal/external aids, Functional tasks, SLP instruction and feedback, Compensatory strategies, and Patient/family  education         [place], CCC-SLP 03/04/2023, 9:36 AM

## 2023-03-05 ENCOUNTER — Ambulatory Visit: Payer: Medicare Other | Admitting: Speech Pathology

## 2023-03-10 ENCOUNTER — Ambulatory Visit: Payer: Medicare Other | Admitting: Speech Pathology

## 2023-03-11 ENCOUNTER — Encounter: Payer: Self-pay | Admitting: Speech Pathology

## 2023-03-11 ENCOUNTER — Ambulatory Visit: Payer: Medicare Other | Admitting: Speech Pathology

## 2023-03-11 DIAGNOSIS — R498 Other voice and resonance disorders: Secondary | ICD-10-CM

## 2023-03-11 NOTE — Therapy (Signed)
OUTPATIENT SPEECH LANGUAGE PATHOLOGY TREATMENT NOTE & DISCHARGE SUMMARY   Patient Name: Amber Barry MRN: [id number] DOB:07-24-1941, 82 y.o., female Today's Date: 03/11/2023  PCP: Blair Heys, MD REFERRING PROVIDER: Maeola Harman MD  SPEECH THERAPY DISCHARGE SUMMARY  Visits from Start of Care: 8  Current functional level related to goals / functional outcomes: Pt has met all goals and reports significant improvement in coughing/throat clears using strategies learned in ST. SLP rec ENT referral to r/o additional laryngeal impairment.    Remaining deficits: Chronic cough   Education / Equipment: Completed; handouts   Patient agrees to discharge. Patient goals were met. Patient is being discharged due to meeting the stated rehab goals.     END OF SESSION:   End of Session - 03/11/23 0806     Visit Number 8    Number of Visits 9    Date for SLP Re-Evaluation 03/13/23    SLP Start Time 0803    SLP Stop Time  0843    SLP Time Calculation (min) 40 min    Activity Tolerance Patient tolerated treatment well             Past Medical History:  Diagnosis Date   Anemia    pt denies   Anxiety    Arthritis    Osteoarthritis   Bursitis    left hip-had injection 2015   Carotid artery occlusion 11/2012   Left Bruit, 80% blockage   CKD (chronic kidney disease)    Stage III   Diverticulitis    Hematuria    Hypertension    Interstitial cystitis    Lower back pain 01/2014   Ocular migraine    Osteopenia    PONV (postoperative nausea and vomiting)    Past Surgical History:  Procedure Laterality Date   ENDARTERECTOMY Right 12/22/2012   Procedure: Right Carotid Endarterectomy with hemashield patch angioplasty;  Surgeon: Pryor Ochoa, MD;  Location: [facility] OR;  Service: Vascular;  Laterality: Right;   ENDARTERECTOMY Right 01/13/2023   Procedure: RIGHT ENDARTERECTOMY COMMON CAROTID;  Surgeon: Maeola Harman, MD;  Location: [facility] OR;  Service:  Vascular;  Laterality: Right;   PATCH ANGIOPLASTY Right 01/13/2023   Procedure: PATCH ANGIOPLASTY USING HEMASHIELD PLATINUM PATCH 0.8CM X 7.6CM;  Surgeon: Maeola Harman, MD;  Location: [facility] OR;  Service: Vascular;  Laterality: Right;   TONSILLECTOMY  1947   Patient Active Problem List   Diagnosis Date Noted   Carotid stenosis 01/13/2023   Asymptomatic carotid artery stenosis without infarction, right 01/13/2023   Hypertension 08/12/2018   Osteopenia 08/12/2018   Unspecified constipation 12/05/2013   Occlusion and stenosis of carotid artery without mention of cerebral infarction 11/30/2012      ONSET DATE: Referred on 02/04/23    REFERRING DIAG: "cough after carotid surgery" (No diagnosis code was provided)  THERAPY DIAG:  Other voice and resonance disorders  Rationale for Evaluation and Treatment: Rehabilitation  SUBJECTIVE:   SUBJECTIVE STATEMENT: "I use cough drops occasionally."  PAIN:  Are you having pain? No    OBJECTIVE:   TODAY'S TREATMENT:  DATE:   03/11/23: Pt was seen for skilled ST services targeting chronic cough. Discussed readiness for d/c this session. Pt reported being tired, having increased drainage, and SLP observed a hoarse voice. She reported she tested negative for Covid, so she attended today's session. More coughs and throat clears were noted this session; however, SLP suspects this is 2/2 to increased drainage. SLP discussed ENT referral and has reached out to provider to place referral. Pt reports understanding of our strategies and stretches. She feels like she has improved significantly, but still has some work to do. She initiated daily allergy medicine - claritan. Pt to reach out to SLP in 1 month if she feels she is not making progress. SLP to monitor chart for ENT referral placed. Pt in agreement with d/c.    03/04/23: Pt was seen for skilled ST services targeting chronic cough. SLP reviewed triggers with patient - sudden inhale/cold air/post nasal drip. During today's session, pt participated in informal conversation using swallow strategy. Pt benefited from SLP cues to swallow occasionally when speaking and this seemed to be successful, as pt only generated 1 throat clear in 30 min conversation. Pt reports she does not want to participate in PT at this time, as she feels like she has no pain and doesn't want to mess that up. SLP reached out to PA for ENT referral to r/o any additional impairment as pt has made some improvement. SLP explained to pt and pt reported back understanding.  Pt reports continued use of cough drops occasionally before bed; however, now looks for the cough drops with minimal menthol. SLP to see next session to ensure pt feels comfortable with strategies and stretches. SLP to discharge and defer to ENT for follow up if warranted.   03/02/23: Patient was seen for skilled ST services with focus stretches, relaxation, and functional practice of suppression strategies. Reviewed stretches with patient. Continues to have difficulty with abdominal breathing. SLP rec continued work on coordinating respiratory system for more efficient breath use.   Patient was observed to cough x6 this session. Coughing seemed to be triggered by a sudden inhale - when laughing, or speaking. SLP noted throat clears x4 throughout session. Pt continues to feel like she is making improvement. Pt is impacted by the feeling of post nasal drip. To continue with therapy to ensure pt feels comfortable with using strategies and completing stretching HEP. To discharge in 2 sessions. May discuss ENT referral to r/o any other impairments.   02/24/23: Patient was seen for skilled ST services with focus on stretches, relaxation, and functional practice of suppression strategies.  Patient reported she was unclear on how to  complete shoulder rolls and jaw massage.  SLP demonstrated and instructed patient to demonstrate back.  Patient was able to complete independently by the end of the session.  Due to extremely reduced neck and back ROM, SLP recommends patient receive a PT referral.  To follow-up with doc.   Patient was observed to cough x4 while completing yawn-sigh exercise only and throat cleared x 4 throughout today's 45-minute session.  SLP encouraged patient to identify why she feels the need to throat clear.  SLP encouraged patient to think about the feeling of drainage in the back of her throat and swallow more when she is speaking BEFORE she feels the need to throat clear.  With SLP cues to swallow during a 15-minute conversation, patient did not throat clear once.  To continue with this next session.   02/19/23: Pt  was seen for skilled ST services with focus on cough suppression strategies.  Patient reported several people noticing her coughing less throughout the day.  Patient reported she has been using the swallow strategy which has been extremely helpful.  She reports that she does have more difficulty "catching" the throat clear.   Patient was observed to throat clear x5 and cough x5 in today's session. SLP developed and edu on stretching plan for patient to assist with decreasing tension (SLP did rec pt seek PT to assist with increase cervical mobility - pt declined at this time).  The following exercises were completed re:  Diaphragmatic Breathing x10  Inhale: smell a flower Exhale: blow out a candle  **Stomach should be filling up like a balloon on inhale.   Shoulder rolls Front to back x10  Head/chin down hold x15 sec  Side to side rolls x10   Ear to shoulder hold (3x)  R ear down, left arm out and hold (15 sec)  L ear down, right arm out and hold (15 sec)  Jaw massage x20 x2 Forward motion  Cow chews x10  Yawn-sigh x10  Pt required mod-max A verbal and tactile cues to complete each  exercise.   02/16/23: Pt was seen for skilled ST services with focus on cough suppression strategies. Pt reports she has not been taking any tessalon perles and has noticed no difference between taking vs not taking. Pt coughed/throat cleared x15 in 45 min session. No attempts were made to swallow prior to.  Swallow replacement strategies  -Pt reports when sitting silently (not speaking) she is able to catch her cough/throat clear and use a replacement strategy ~50% of the time. She is becoming more aware of the sensation prior to cough/throat clear.  **reports she has noticed scents and change in temperature (cold/fans on) are cough triggers (this has been happening PRIOR to surgery)   Reports she attempted on belly breathing but caused cough so stopped.   Performed stretching re: seated cervical side bending. Provided handout to pt. Pt has extremely decreased neck ROM. Did not want PT at this time. To provide additional stretches to decrease tension in head/neck/laryngeal areas next session.   Pt required maxA for abdominal breathing. Pt had difficulty relaxing tension in all muscles and coordinating for respiration. SLP provided video, visual cues, and tactile cues for support. To cont.     02/13/23: Pt was seen for skilled ST services with focus on cough suppression strategies. Pt reports she has not taken any tessalon perles today, but is unsure if she is coughing any less.  Pt reports she has been attempting swallow replacement strategies and that they have helped some.   Pt coughed/throat cleared x17 in 45 min session. No attempts were made to swallow prior to either episode. SLP suggested we focus on identifying the "feeling" she feels prior to coughing to increase awareness. Pt has difficulty describing; however, with prompting, was able to report that it feels "like there is something in there". She also reports her throat feels dry sometimes. SLP encouraged pt to sit for 15 minutes, focus  on the feeling she gets prior to coughing, and swallow prior to if she is able. SLP suspects focused practice may be helpful to increase ability to complete replacement strategy successfully. When pt looked visibly like she was going to cough (taking a deep breath in), SLP attempted to intervene with a cue to swallow. This was successful x2 reportedly.   Began edu on relaxation exercises.  Pt required maxA verbal and tactile cues to complete abdominal breathing. To cont.   PATIENT EDUCATION: Education details: Irritable Larynx post surgery Person educated: Patient Education method: Explanation Education comprehension: verbalized understanding     GOALS: Goals reviewed with patient? Yes   SHORT TERM GOALS: Target date: 03/13/23   Pt will report decreased coughing/throat clearing with use of cough suppression strategies. Baseline: Goal status: MET   2.  Pt will demonstrate use of SOVT exercises to help divert tension from the larynx independently.  Baseline:  Goal status: DEFERRED   3.  Pt will report use of relaxation strategies to decrease suspected laryngeal tension independently. Baseline:  Goal status: MET       ASSESSMENT:   CLINICAL IMPRESSION: Pt is a 82 yo female who presented to ST OP for evaluation post carotid endarterectomy with complaints of continued cough. SLP to d/c this session. Pt has made significant improvement. She scored a 16.33 on the LCQ (3-21) indicating a better QOL than when she came in. Pt reports she has improved in all areas and is in agreement with d/c today.    OBJECTIVE IMPAIRMENTS: include voice disorder. These impairments are limiting patient from effectively communicating at home and in community. Factors affecting potential to achieve goals and functional outcome are  NA . Patient will benefit from skilled SLP services to address above impairments and improve overall function.   REHAB POTENTIAL: Good   PLAN:   SLP FREQUENCY: 1-2x/week   SLP  DURATION: 4 weeks   PLANNED INTERVENTIONS: Environmental controls, Cueing hierachy, Internal/external aids, Functional tasks, SLP instruction and feedback, Compensatory strategies, and Patient/family education         [place], CCC-SLP 03/11/2023, 8:08 AM

## 2023-03-23 ENCOUNTER — Other Ambulatory Visit: Payer: Self-pay | Admitting: *Deleted

## 2023-03-23 DIAGNOSIS — E785 Hyperlipidemia, unspecified: Secondary | ICD-10-CM

## 2023-04-08 ENCOUNTER — Encounter (HOSPITAL_COMMUNITY): Payer: Medicare Other

## 2023-04-08 ENCOUNTER — Encounter (HOSPITAL_COMMUNITY): Payer: Self-pay

## 2023-04-08 ENCOUNTER — Ambulatory Visit: Payer: Medicare Other

## 2023-04-17 DIAGNOSIS — R053 Chronic cough: Secondary | ICD-10-CM | POA: Diagnosis not present

## 2023-06-11 DIAGNOSIS — E785 Hyperlipidemia, unspecified: Secondary | ICD-10-CM | POA: Diagnosis not present

## 2023-06-12 LAB — NMR, LIPOPROFILE
Cholesterol, Total: 99 mg/dL — ABNORMAL LOW (ref 100–199)
HDL Particle Number: 26.1 umol/L — ABNORMAL LOW (ref >=30.5)
HDL-C: 46 mg/dL (ref >39)
LDL Particle Number: 580 nmol/L (ref <1000)
LDL Size: 20.5 nmol — ABNORMAL LOW (ref >20.5)
LDL-C (NIH Calc): 42 mg/dL (ref 0–99)
LP-IR Score: 41 (ref <=45)
Small LDL Particle Number: 367 nmol/L (ref <=527)
Triglycerides: 39 mg/dL (ref 0–149)

## 2023-06-13 DIAGNOSIS — Z23 Encounter for immunization: Secondary | ICD-10-CM | POA: Diagnosis not present

## 2023-06-17 ENCOUNTER — Encounter: Payer: Self-pay | Admitting: Internal Medicine

## 2023-06-17 ENCOUNTER — Ambulatory Visit: Payer: Medicare Other | Attending: Cardiovascular Disease | Admitting: Internal Medicine

## 2023-06-17 DIAGNOSIS — I6521 Occlusion and stenosis of right carotid artery: Secondary | ICD-10-CM

## 2023-06-17 DIAGNOSIS — E782 Mixed hyperlipidemia: Secondary | ICD-10-CM | POA: Diagnosis not present

## 2023-06-17 DIAGNOSIS — E785 Hyperlipidemia, unspecified: Secondary | ICD-10-CM

## 2023-06-17 MED ORDER — ROSUVASTATIN CALCIUM 5 MG PO TABS: 5.0000 mg | ORAL_TABLET | Freq: Every day | ORAL | 3 refills | Status: AC

## 2023-06-17 NOTE — Patient Instructions (Signed)
Medication Instructions:  Crestor has been refilled  *If you need a refill on your cardiac medications before your next appointment, please call your pharmacy*   Lab Work: FASTING lab work in 1 year to check cholesterol   If you have labs (blood work) drawn today and your tests are completely normal, you will receive your results only by: MyChart Message (if you have MyChart) OR A paper copy in the mail If you have any lab test that is abnormal or we need to change your treatment, we will call you to review the results.   Follow-Up: At [facility], you and your health needs are our priority.  As part of our continuing mission to provide you with exceptional heart care, we have created designated Provider Care Teams.  These Care Teams include your primary Cardiologist (physician) and Advanced Practice Providers (APPs -  Physician Assistants and Nurse Practitioners) who all work together to provide you with the care you need, when you need it.  We recommend signing up for the patient portal called "MyChart".  Sign up information is provided on this After Visit Summary.  MyChart is used to connect with patients for Virtual Visits (Telemedicine).  Patients are able to view lab/test results, encounter notes, upcoming appointments, etc.  Non-urgent messages can be sent to your provider as well.   To learn more about what you can do with MyChart, go to [email or web address].    Your next appointment:   12 months with Dr. Rennis Golden or Eligha Bridegroom NP for lipid clinic

## 2023-06-17 NOTE — Progress Notes (Signed)
Virtual Visit via Video Note   Because of Amber Barry's co-morbid illnesses, she is at least at moderate risk for complications without adequate follow up.  This format is felt to be most appropriate for this patient at this time.  All issues noted in this document were discussed and addressed.  A limited physical exam was performed with this format.  Please refer to the patient's chart for her consent to telehealth for [facility].      Date:  06/17/2023   ID:  Amber Barry, DOB 02-07-1941, MRN [id number] The patient was identified using 2 identifiers.  Evaluation Performed:  Follow-Up Visit  Patient Location:  [address] [place] [address]  Provider location:   [address], [address] [place], [place] [address]  PCP:  Debroah Loop, DO  Cardiologist:  None Electrophysiologist:  None   Chief Complaint:  Follow-up dyslipidemia  History of Present Illness:    Amber Barry is a 82 y.o. female who presents via audio/video conferencing for a telehealth visit today.  This is a pleasant female with a history of carotid artery disease with prior right carotid intervention by Dr. Hart Rochester in the past.  She is currently followed by Dr. Randie Heinz and recently has had some worsening stenosis of the right carotid.  She was in the severe range based on Doppler velocities back in August however plan was to repeat Dopplers in 6 months per her request.  Unfortunately recently she had a spike in blood pressure as well as lightheadedness and presented to the emergency department a few days ago for evaluation.  She underwent CT angiography of the neck which showed about 80% right carotid artery stenosis and then ultimately had an MRI of the head which showed no evidence of acute stroke.  Etiology of her elevated blood pressure was not clear.  She had no chest pain or shortness of breath.  She says she has some anxiety.  She has not been on any lipid-lowering therapy for quite some time  after having failed atorvastatin and pravastatin in the past.  Her last lipid profile in December showed total cholesterol 138, triglycerides 81, HDL 40 and LDL 82.  12/02/2022  Amber Barry returns today for follow-up of dyslipidemia.  She has done well on low-dose rosuvastatin.  She seems to be tolerating this well.  Her LDL particle number was 389, LDL-C 46, HDL 54 and triglycerides 36.  LP(a) was tested and negative at 14.2 nmol/L.  She tells me today that her vascular surgeon is considering a second operation on her carotid artery because there is now 90% stenosis.  She has some concerns about the procedure due to the increased risk of stroke.  An EKG was performed today as part of a preoperative workup which shows normal sinus rhythm and a right bundle branch block.  This is a new finding since 2023 comparator but does not necessarily indicate any ischemia.  She denies any anginal symptoms.  06/17/2023  Amber Barry returns today for follow-up.  She underwent carotid endarterectomy on the right back in June which was uneventful.  She did have chronic cough after that eventually was thought to be due to reflux.  Finally it has resolved.  She had repeat lipid testing 6 days ago.  Her cholesterol remains well-controlled.  LDL particle #580, LDL 42, HDL 46 and triglycerides 39.  Overall excellent treatment on low-dose rosuvastatin.  Prior CV studies:   The following studies were reviewed today:  Chart  reviewed, labwork  PMHx:  Past Medical History:  Diagnosis Date   Anemia    pt denies   Anxiety    Arthritis    Osteoarthritis   Bursitis    left hip-had injection 2015   Carotid artery occlusion 11/2012   Left Bruit, 80% blockage   CKD (chronic kidney disease)    Stage III   Diverticulitis    Hematuria    Hypertension    Interstitial cystitis    Lower back pain 01/2014   Ocular migraine    Osteopenia    PONV (postoperative nausea and vomiting)     Past Surgical History:  Procedure  Laterality Date   ENDARTERECTOMY Right 12/22/2012   Procedure: Right Carotid Endarterectomy with hemashield patch angioplasty;  Surgeon: Pryor Ochoa, MD;  Location: [facility] OR;  Service: Vascular;  Laterality: Right;   ENDARTERECTOMY Right 01/13/2023   Procedure: RIGHT ENDARTERECTOMY COMMON CAROTID;  Surgeon: Maeola Harman, MD;  Location: [facility] OR;  Service: Vascular;  Laterality: Right;   PATCH ANGIOPLASTY Right 01/13/2023   Procedure: PATCH ANGIOPLASTY USING HEMASHIELD PLATINUM PATCH 0.8CM X 7.6CM;  Surgeon: Maeola Harman, MD;  Location: [facility] OR;  Service: Vascular;  Laterality: Right;   TONSILLECTOMY  1947    FAMHx:  Family History  Problem Relation Age of Onset   COPD Mother        Respiratory Disease   Rheumatic fever Mother    Pulmonary disease Mother    Heart attack Mother    Stroke Father    Hypertension Father    Diabetes Paternal Aunt    Prostate cancer Maternal Grandfather    Hypertension Paternal Aunt    Hypertension Paternal Grandmother    Other Maternal Grandmother        hardening arteries   Stroke Other        paternal side    SOCHx:   reports that she has never smoked. She has never been exposed to tobacco smoke. She has never used smokeless tobacco. She reports that she does not drink alcohol and does not use drugs.  ALLERGIES:  Allergies  Allergen Reactions   Benicar Hct [Olmesartan Medoxomil-Hctz] Swelling    Facial swelling   Buspar [Buspirone] Other (See Comments)    Facial Dysesthesia   Norvasc [Amlodipine Besylate] Swelling    legs   Zoloft [Sertraline Hcl] Anxiety    Increased anxiety, dysesthesia   Lipitor [Atorvastatin] Other (See Comments)    Generalized muscle aches   Lisinopril Swelling    Around eyes   Motrin [Ibuprofen] Other (See Comments)     Contraindicated with Celebrex. Pt states MD said to not take together.   Amitriptyline Hcl Anxiety    And Paresthesias   Pravastatin Other (See Comments)    Pt. Wanted to sleep,  no energy.    MEDS:  Current Meds  Medication Sig   acetaminophen (TYLENOL) 500 MG tablet Take 1,000 mg by mouth daily.   ALPRAZolam (XANAX) 0.25 MG tablet Take 0.125 mg by mouth 2 (two) times daily as needed for anxiety.    aspirin EC 81 MG tablet Take 243 mg by mouth daily.   azelastine (ASTELIN) 0.1 % nasal spray Place 2 sprays into both nostrils 2 (two) times daily as needed for rhinitis or allergies.   benzonatate (TESSALON) 200 MG capsule Take 200 mg by mouth 3 (three) times daily as needed for cough.   Calcium-Vitamin D (CALTRATE 600 PLUS-VIT D PO) Take 1 tablet by mouth daily at 12 noon. Soft  chews   celecoxib (CELEBREX) 200 MG capsule Take 200 mg by mouth 2 (two) times a week.   cholecalciferol (VITAMIN D3) 25 MCG (1000 UNIT) tablet Take 1,000 Units by mouth daily at 12 noon.   escitalopram (LEXAPRO) 20 MG tablet Take 20 mg by mouth daily.   hydrochlorothiazide (HYDRODIURIL) 25 MG tablet Take 25 mg by mouth daily.    lisinopril (ZESTRIL) 10 MG tablet Take 10 mg by mouth daily.   oxyCODONE-acetaminophen (PERCOCET/ROXICET) 5-325 MG tablet Take 1 tablet by mouth every 6 (six) hours as needed for moderate pain.   Probiotic Product (ALIGN) 4 MG CAPS Take 4 mg by mouth daily.   Pseudoephedrine HCl (SUDAFED PO) Take 30 mg by mouth daily as needed (allergies).   rosuvastatin (CRESTOR) 5 MG tablet Take 1 tablet (5 mg total) by mouth daily.     ROS: Pertinent items noted in HPI and remainder of comprehensive ROS otherwise negative.  Labs/Other Tests and Data Reviewed:    Recent Labs: 01/06/2023: ALT 15 01/14/2023: BUN 18; Creatinine, Ser 1.09; Hemoglobin 9.2; Platelets 136; Potassium 4.4; Sodium 133   Recent Lipid Panel Lab Results  Component Value Date/Time   CHOL 72 01/14/2023 03:27 AM   TRIG 22 01/14/2023 03:27 AM   HDL 40 (L) 01/14/2023 03:27 AM   CHOLHDL 1.8 01/14/2023 03:27 AM   LDLCALC 28 01/14/2023 03:27 AM    Wt Readings from Last 3 Encounters:  02/04/23 123 lb 14.4  oz (56.2 kg)  01/13/23 125 lb (56.7 kg)  01/06/23 124 lb 9.6 oz (56.5 kg)     Exam:    Vital Signs:  LMP 08/11/2002    General appearance: alert and no distress Lungs: No visual respiratory difficulty Abdomen: Overweight Extremities: extremities normal, atraumatic, no cyanosis or edema Neurologic: Grossly normal  ASSESSMENT & PLAN:    S/p right carotid endarterectomy Mixed dyslipidemia, goal LDL less than 70 History of statin intolerance-myalgias, fatigue Severe right internal carotid artery stenosis with prior right carotid endarterectomy RBBB  Ms. Barry did well with carotid endarterectomy.  Her lipids are well-controlled.  She continues to tolerate low-dose rosuvastatin.  Will renew this medication with plans for a follow-up and repeat lipid in 1 year.  Patient Risk:   After full review of this patients clinical status, I feel that they are at least moderate risk at this time.  Time:   Today, I have spent 15 minutes with the patient with telehealth technology discussing dyslipidemia.     Medication Adjustments/Labs and Tests Ordered: Current medicines are reviewed at length with the patient today.  Concerns regarding medicines are outlined above.   Tests Ordered: No orders of the defined types were placed in this encounter.   Medication Changes: No orders of the defined types were placed in this encounter.   Disposition:  in 1 year(s)  Chrystie Nose, MD, [facility], FACP  [place]  [facility]  Medical Director of the Advanced Lipid Disorders &  Cardiovascular Risk Reduction Clinic Diplomate of the American Board of Clinical Lipidology Attending Cardiologist  Direct Dial: [phone]  Fax: [phone]  Website:  [email or web address]  Chrystie Nose, MD  06/17/2023 8:40 AM

## 2023-08-03 ENCOUNTER — Observation Stay (HOSPITAL_COMMUNITY)
Admission: EM | Admit: 2023-08-03 | Discharge: 2023-08-04 | Disposition: A | Discharge status: Home / Self Care | Payer: Medicare Other | Attending: Student | Admitting: Student

## 2023-08-03 ENCOUNTER — Emergency Department (HOSPITAL_COMMUNITY): Payer: Medicare Other

## 2023-08-03 ENCOUNTER — Encounter (HOSPITAL_COMMUNITY): Payer: Self-pay

## 2023-08-03 ENCOUNTER — Ambulatory Visit
Admission: EM | Admit: 2023-08-03 | Discharge: 2023-08-03 | Payer: Medicare Other | Attending: Family Medicine | Admitting: Family Medicine

## 2023-08-03 ENCOUNTER — Other Ambulatory Visit: Payer: Self-pay

## 2023-08-03 DIAGNOSIS — R8271 Bacteriuria: Secondary | ICD-10-CM | POA: Insufficient documentation

## 2023-08-03 DIAGNOSIS — I129 Hypertensive chronic kidney disease with stage 1 through stage 4 chronic kidney disease, or unspecified chronic kidney disease: Secondary | ICD-10-CM | POA: Insufficient documentation

## 2023-08-03 DIAGNOSIS — I639 Cerebral infarction, unspecified: Principal | ICD-10-CM

## 2023-08-03 DIAGNOSIS — I4510 Unspecified right bundle-branch block: Secondary | ICD-10-CM

## 2023-08-03 DIAGNOSIS — N183 Chronic kidney disease, stage 3 unspecified: Secondary | ICD-10-CM | POA: Diagnosis not present

## 2023-08-03 DIAGNOSIS — D509 Iron deficiency anemia, unspecified: Secondary | ICD-10-CM | POA: Diagnosis not present

## 2023-08-03 DIAGNOSIS — Z79899 Other long term (current) drug therapy: Secondary | ICD-10-CM | POA: Diagnosis not present

## 2023-08-03 DIAGNOSIS — R4781 Slurred speech: Secondary | ICD-10-CM

## 2023-08-03 DIAGNOSIS — R9431 Abnormal electrocardiogram [ECG] [EKG]: Secondary | ICD-10-CM | POA: Diagnosis not present

## 2023-08-03 DIAGNOSIS — D649 Anemia, unspecified: Secondary | ICD-10-CM

## 2023-08-03 DIAGNOSIS — Z7982 Long term (current) use of aspirin: Secondary | ICD-10-CM | POA: Diagnosis not present

## 2023-08-03 DIAGNOSIS — I472 Ventricular tachycardia, unspecified: Secondary | ICD-10-CM

## 2023-08-03 DIAGNOSIS — E871 Hypo-osmolality and hyponatremia: Secondary | ICD-10-CM | POA: Diagnosis not present

## 2023-08-03 DIAGNOSIS — E785 Hyperlipidemia, unspecified: Secondary | ICD-10-CM

## 2023-08-03 DIAGNOSIS — R531 Weakness: Secondary | ICD-10-CM | POA: Diagnosis not present

## 2023-08-03 DIAGNOSIS — R2689 Other abnormalities of gait and mobility: Secondary | ICD-10-CM | POA: Insufficient documentation

## 2023-08-03 DIAGNOSIS — I10 Essential (primary) hypertension: Secondary | ICD-10-CM | POA: Diagnosis present

## 2023-08-03 DIAGNOSIS — R29810 Facial weakness: Secondary | ICD-10-CM

## 2023-08-03 LAB — DIFFERENTIAL
Abs Immature Granulocytes: 0.01 10*3/uL (ref 0.00–0.07)
Basophils Absolute: 0 10*3/uL (ref 0.0–0.1)
Basophils Relative: 1 %
Eosinophils Absolute: 0.4 10*3/uL (ref 0.0–0.5)
Eosinophils Relative: 6 %
Immature Granulocytes: 0 %
Lymphocytes Relative: 20 %
Lymphs Abs: 1.3 10*3/uL (ref 0.7–4.0)
Monocytes Absolute: 0.9 10*3/uL (ref 0.1–1.0)
Monocytes Relative: 14 %
Neutro Abs: 3.9 10*3/uL (ref 1.7–7.7)
Neutrophils Relative %: 59 %

## 2023-08-03 LAB — I-STAT CHEM 8, ED
BUN: 30 mg/dL — ABNORMAL HIGH (ref 8–23)
Calcium, Ion: 1.25 mmol/L (ref 1.15–1.40)
Chloride: 103 mmol/L (ref 98–111)
Creatinine, Ser: 1.4 mg/dL — ABNORMAL HIGH (ref 0.44–1.00)
Glucose, Bld: 133 mg/dL — ABNORMAL HIGH (ref 70–99)
HCT: 34 % — ABNORMAL LOW (ref 36.0–46.0)
Hemoglobin: 11.6 g/dL — ABNORMAL LOW (ref 12.0–15.0)
Potassium: 4.4 mmol/L (ref 3.5–5.1)
Sodium: 137 mmol/L (ref 135–145)
TCO2: 26 mmol/L (ref 22–32)

## 2023-08-03 LAB — COMPREHENSIVE METABOLIC PANEL
ALT: 13 U/L (ref 0–44)
AST: 21 U/L (ref 15–41)
Albumin: 3.8 g/dL (ref 3.5–5.0)
Alkaline Phosphatase: 59 U/L (ref 38–126)
Anion gap: 8 (ref 5–15)
BUN: 28 mg/dL — ABNORMAL HIGH (ref 8–23)
CO2: 23 mmol/L (ref 22–32)
Calcium: 9.7 mg/dL (ref 8.9–10.3)
Chloride: 102 mmol/L (ref 98–111)
Creatinine, Ser: 1.26 mg/dL — ABNORMAL HIGH (ref 0.44–1.00)
GFR, Estimated: 42 mL/min — ABNORMAL LOW (ref >60)
Glucose, Bld: 134 mg/dL — ABNORMAL HIGH (ref 70–99)
Potassium: 4.5 mmol/L (ref 3.5–5.1)
Sodium: 133 mmol/L — ABNORMAL LOW (ref 135–145)
Total Bilirubin: 0.2 mg/dL (ref <1.2)
Total Protein: 7.3 g/dL (ref 6.5–8.1)

## 2023-08-03 LAB — CBC
HCT: 34.3 % — ABNORMAL LOW (ref 36.0–46.0)
Hemoglobin: 11.2 g/dL — ABNORMAL LOW (ref 12.0–15.0)
MCH: 30 pg (ref 26.0–34.0)
MCHC: 32.7 g/dL (ref 30.0–36.0)
MCV: 92 fL (ref 80.0–100.0)
Platelets: 230 10*3/uL (ref 150–400)
RBC: 3.73 MIL/uL — ABNORMAL LOW (ref 3.87–5.11)
RDW: 12.7 % (ref 11.5–15.5)
WBC: 6.5 10*3/uL (ref 4.0–10.5)
nRBC: 0 % (ref 0.0–0.2)

## 2023-08-03 LAB — ETHANOL: Alcohol, Ethyl (B): <10 mg/dL (ref <10)

## 2023-08-03 LAB — CBG MONITORING, ED: Glucose-Capillary: 101 mg/dL — ABNORMAL HIGH (ref 70–99)

## 2023-08-03 LAB — PROTIME-INR
INR: 1.1 (ref 0.8–1.2)
Prothrombin Time: 14.3 s (ref 11.4–15.2)

## 2023-08-03 LAB — APTT: aPTT: 31 s (ref 24–36)

## 2023-08-03 LAB — POCT FASTING CBG KUC MANUAL ENTRY: POCT Glucose (KUC): 142 mg/dL — AB (ref 70–99)

## 2023-08-03 MED ORDER — SODIUM CHLORIDE 0.9% FLUSH: 3.0000 mL | Freq: Once | INTRAVENOUS | Status: DC

## 2023-08-03 MED ORDER — LORAZEPAM 1 MG PO TABS
1.0000 mg | ORAL_TABLET | Freq: Once | ORAL | Status: AC
  Administered 2023-08-03: 1 mg via ORAL
  Filled 2023-08-03: qty 1

## 2023-08-03 NOTE — ED Triage Notes (Signed)
Pt was sent here from Urgent Care for EKG changes, thjt her EKG looked different from the last one in April. She also reports speech changes that started today around 1230, states its not "slurred" but just sounds different. She also reports fatigue.

## 2023-08-03 NOTE — Discharge Instructions (Signed)
Go to [facility] for further evaluation of abnormal ECG

## 2023-08-03 NOTE — ED Notes (Signed)
Patient is being discharged from the Urgent Care and sent to the Emergency Department via POV . Per Jerrilyn Cairo FNP, patient is in need of higher level of care due to changes in EKG, slurred speech. Patient is aware and verbalizes understanding of plan of care.  Vitals:   08/03/23 1743  BP: (!) 150/64  Pulse: 99  Resp: 18

## 2023-08-03 NOTE — ED Provider Notes (Signed)
Accepted handoff at shift change from [facility]. Please see prior provider note for full HPI.  Briefly: Patient is a 82 y.o. female who presents to the ER for abnormal EKG from urgent care. Originally went to [facility] with complaint of slurred speech and left facial droop starting at 1130. Reported waxing and waning in intensity.   DDX/Plan: EKG shows RBBB, consistent compared to prior. Previous provider consulted with neurology regarding facial droop/slurred speech and they recommended MRI. MRI has been ordered. Anticipate if normal, dc to home.   Physical Exam  BP 128/69   Pulse 91   Temp 98.6 F (37 C) (Axillary)   Resp 17   Ht 4\' 10"  (1.473 m)   Wt 56.2 kg   LMP 08/11/2002   SpO2 97%   BMI 25.92 kg/m   Physical Exam Vitals and nursing note reviewed.  Constitutional:      Appearance: Normal appearance.  HENT:     Head: Normocephalic and atraumatic.  Eyes:     Conjunctiva/sclera: Conjunctivae normal.  Pulmonary:     Effort: Pulmonary effort is normal. No respiratory distress.  Skin:    General: Skin is warm and dry.  Neurological:     Mental Status: She is alert.     Comments: Mild left facial droop, no significant slurred speech   Psychiatric:        Mood and Affect: Mood normal.        Behavior: Behavior normal.    Results  MR BRAIN WO CONTRAST Result Date: 08/04/2023 CLINICAL DATA:  Stroke suspected, facial paralysis and weakness EXAM: MRI HEAD WITHOUT CONTRAST TECHNIQUE: Multiplanar, multiecho pulse sequences of the brain and surrounding structures were obtained without intravenous contrast. COMPARISON:  08/29/2022 MRI head, correlation is also made with 08/03/2023 CT head FINDINGS: Brain: Restricted diffusion with ADC correlate in the right lentiform nucleus/external capsule (series 2, images 25-31), compatible with acute infarct. The series associated with increased T2 hyperintense signal. No acute hemorrhage, mass, mass effect, or midline shift. No hydrocephalus or  extra-axial collection. No hemosiderin deposition to suggest remote hemorrhage. Scattered T2 hyperintense signal in the periventricular white matter and pons, likely the sequela of mild chronic small vessel ischemic disease. Vascular: Normal arterial flow voids. Skull and upper cervical spine: Normal marrow signal. Sinuses/Orbits: Clear paranasal sinuses. No acute finding in the orbits. Other: The mastoid air cells are well aerated. IMPRESSION: Acute infarct in the right lentiform nucleus/external capsule. No evidence of hemorrhage. These results will be called to the ordering clinician or representative by the Radiologist Assistant, and communication documented in the PACS or [employer]. Electronically Signed   By: Wiliam Ke M.D.   On: 08/04/2023 02:28   ED Course / MDM    Medical Decision Making Amount and/or Complexity of Data Reviewed Labs: ordered. Radiology: ordered.  Risk Prescription drug management. Decision regarding hospitalization.   MRI positive for acute infarct. Dr Wilford Corner with neurology made aware and will consult while admitted, triad hospitalist consulted for admission. Consulted with Dr Loney Loh who will admit.   The patient appears reasonably stabilized for admission considering the current resources, flow, and capabilities available in the ED at this time, and I doubt any other [facility] requiring further screening and/or treatment in the ED prior to admission.   Su Monks, PA-C 08/04/23 0308    Gilda Crease, MD 08/04/23 [phone]

## 2023-08-03 NOTE — ED Provider Notes (Signed)
[place] EMERGENCY DEPARTMENT AT [facility] Provider Note   CSN: [id number] Arrival date & time: 08/03/23  1817     History  Chief Complaint  Patient presents with   Abnormal ECG    SHAUNIKA RICHBERG is a 82 y.o. female with past medical history of OP, diverticulosis, HLD, CKD, anemia, carotid stenosis, hypertension, presents to emergency department for evaluation of slurred speech that started at 1130 this morning.  She was sent to emergency department following evaluation for symptoms at urgent care.  She reports that the slurred speech has never resolved but has been waxing and waning in intensity.  Family reports that they noticed slurred speech and some "slight left facial droop" when she was reading a Christmas card.  Her speech was described as "slower and muffled".  They deny confusion, recent falls, visual disturbances, weakness, paresthesia.  She is not on blood thinners.  Of note, she reports that she has been having increased difficulty with sleep over the past 4 nights due to her left leg hurting at night causing her to sleep for about "3 to 4 hours at night"  HPI    Home Medications Prior to Admission medications   Medication Sig Start Date End Date Taking? Authorizing Provider  acetaminophen (TYLENOL) 500 MG tablet Take 1,000 mg by mouth daily.    [provider]  ALPRAZolam Prudy Feeler) 0.25 MG tablet Take 0.125 mg by mouth 2 (two) times daily as needed for anxiety.     [provider]  aspirin EC 81 MG tablet Take 243 mg by mouth daily.    [provider]  azelastine (ASTELIN) 0.1 % nasal spray Place 2 sprays into both nostrils 2 (two) times daily as needed for rhinitis or allergies. 11/15/14   [provider]  benzonatate (TESSALON) 200 MG capsule Take 200 mg by mouth 3 (three) times daily as needed for cough.    [provider]  Calcium-Vitamin D (CALTRATE 600 PLUS-VIT D PO) Take 1 tablet by mouth daily at 12 noon. Soft  chews    [provider]  celecoxib (CELEBREX) 200 MG capsule Take 200 mg by mouth 2 (two) times a week.    [provider]  cholecalciferol (VITAMIN D3) 25 MCG (1000 UNIT) tablet Take 1,000 Units by mouth daily at 12 noon.    [provider]  escitalopram (LEXAPRO) 20 MG tablet Take 20 mg by mouth daily.    [provider]  hydrochlorothiazide (HYDRODIURIL) 25 MG tablet Take 25 mg by mouth daily.  11/15/14   [provider]  lisinopril (ZESTRIL) 10 MG tablet Take 10 mg by mouth daily.    [provider]  oxyCODONE-acetaminophen (PERCOCET/ROXICET) 5-325 MG tablet Take 1 tablet by mouth every 6 (six) hours as needed for moderate pain. 01/14/23   Baglia, Corrina, PA-C  Probiotic Product (ALIGN) 4 MG CAPS Take 4 mg by mouth daily.    [provider]  Pseudoephedrine HCl (SUDAFED PO) Take 30 mg by mouth daily as needed (allergies).    [provider]  rosuvastatin (CRESTOR) 5 MG tablet Take 1 tablet (5 mg total) by mouth daily. 06/17/23 06/11/24  Chrystie Nose, MD      Allergies    Benicar hct [olmesartan medoxomil-hctz], Buspar [buspirone], Norvasc [amlodipine besylate], Zoloft [sertraline hcl], Lipitor [atorvastatin], Lisinopril, Motrin [ibuprofen], Amitriptyline hcl, and Pravastatin    Review of Systems   Review of Systems  Constitutional:  Negative for chills, fatigue and fever.  Respiratory:  Negative  for cough, chest tightness, shortness of breath and wheezing.   Cardiovascular:  Negative for chest pain and palpitations.  Gastrointestinal:  Negative for abdominal pain, constipation, diarrhea, nausea and vomiting.  Neurological:  Positive for facial asymmetry and speech difficulty. Negative for dizziness, seizures, weakness, light-headedness, numbness and headaches.    Physical Exam Updated Vital Signs BP (!) 161/74   Pulse 72   Temp 98.6 F (37 C) (Axillary)   Resp 18   Ht 4\' 10"  (1.473 m)   Wt 56.2 kg   LMP  08/11/2002   SpO2 100%   BMI 25.92 kg/m  Physical Exam Vitals and nursing note reviewed.  Constitutional:      General: She is not in acute distress.    Appearance: Normal appearance. She is not ill-appearing or diaphoretic.  HENT:     Head: Normocephalic and atraumatic.  Eyes:     General: Lids are everted, no foreign bodies appreciated. Vision grossly intact. No visual field deficit or scleral icterus.    Extraocular Movements:     Right eye: Normal extraocular motion and no nystagmus.     Left eye: Normal extraocular motion and no nystagmus.     Conjunctiva/sclera: Conjunctivae normal.  Cardiovascular:     Rate and Rhythm: Normal rate.  Pulmonary:     Effort: Pulmonary effort is normal. No respiratory distress.  Abdominal:     General: Abdomen is flat. Bowel sounds are normal. There is no distension.     Palpations: Abdomen is soft.     Tenderness: There is no abdominal tenderness. There is no guarding or rebound.  Skin:    General: Skin is warm.     Capillary Refill: Capillary refill takes less than 2 seconds.     Coloration: Skin is not jaundiced or pale.  Neurological:     General: No focal deficit present.     Mental Status: She is alert and oriented to person, place, and time. Mental status is at baseline.     GCS: GCS eye subscore is 4. GCS verbal subscore is 5. GCS motor subscore is 6.     Cranial Nerves: No cranial nerve deficit or facial asymmetry.     Sensory: No sensory deficit.     Motor: No weakness, atrophy, abnormal muscle tone, seizure activity or pronator drift.     Coordination: Coordination is intact. Coordination normal. Finger-Nose-Finger Test and Heel to [facility] Test normal. Rapid alternating movements normal.     Gait: Gait normal.     Deep Tendon Reflexes: Reflexes normal.     Reflex Scores:      Bicep reflexes are 2+ on the right side and 2+ on the left side.      Patellar reflexes are 2+ on the right side and 2+ on the left side.   ED Results /  Procedures / Treatments   Labs (all labs ordered are listed, but only abnormal results are displayed) Labs Reviewed  CBC - Abnormal; Notable for the following components:      Result Value   RBC 3.73 (*)    Hemoglobin 11.2 (*)    HCT 34.3 (*)    All other components within normal limits  COMPREHENSIVE METABOLIC PANEL - Abnormal; Notable for the following components:   Sodium 133 (*)    Glucose, Bld 134 (*)    BUN 28 (*)    Creatinine, Ser 1.26 (*)    GFR, Estimated 42 (*)    All other components within normal limits  I-STAT CHEM  8, ED - Abnormal; Notable for the following components:   BUN 30 (*)    Creatinine, Ser 1.40 (*)    Glucose, Bld 133 (*)    Hemoglobin 11.6 (*)    HCT 34.0 (*)    All other components within normal limits  CBG MONITORING, ED - Abnormal; Notable for the following components:   Glucose-Capillary 101 (*)    All other components within normal limits  PROTIME-INR  APTT  DIFFERENTIAL  ETHANOL  URINALYSIS, ROUTINE W REFLEX MICROSCOPIC    EKG EKG Interpretation Date/Time:  Monday August 03 2023 18:37:20 EST Ventricular Rate:  83 PR Interval:  106 QRS Duration:  122 QT Interval:  398 QTC Calculation: 467 R Axis:   74  Text Interpretation: Sinus rhythm with short PR Right bundle branch block Abnormal ECG When compared with ECG of 03-Aug-2023 17:31, PREVIOUS ECG IS PRESENT No significant change since last tracing Confirmed by Alvira Monday ([id number]) on 08/03/2023 9:01:11 PM  Radiology CT Head Wo Contrast Result Date: 08/03/2023 CLINICAL DATA:  Facial paralysis and weakness. EXAM: CT HEAD WITHOUT CONTRAST TECHNIQUE: Contiguous axial images were obtained from the base of the skull through the vertex without intravenous contrast. RADIATION DOSE REDUCTION: This exam was performed according to the departmental dose-optimization program which includes automated exposure control, adjustment of the mA and/or kV according to patient size and/or use of  iterative reconstruction technique. COMPARISON:  MRI brain 08/29/2022.  CT head 08/28/2022 FINDINGS: Brain: No evidence of acute infarction, hemorrhage, hydrocephalus, extra-axial collection or mass lesion/mass effect. Vascular: No hyperdense vessel or unexpected calcification. Skull: Normal. Negative for fracture or focal lesion. Sinuses/Orbits: Paranasal sinuses and mastoid air cells are clear. Other: None. IMPRESSION: No acute intracranial abnormalities. Electronically Signed   By: Burman Nieves M.D.   On: 08/03/2023 20:36    Procedures Procedures    Medications Ordered in ED Medications  sodium chloride flush (NS) 0.9 % injection 3 mL (0 mLs Intravenous Hold 08/03/23 2100)  LORazepam (ATIVAN) tablet 1 mg (1 mg Oral Given 08/03/23 2344)    ED Course/ Medical Decision Making/ A&P                                 Medical Decision Making Amount and/or Complexity of Data Reviewed Labs: ordered. Radiology: ordered.  Risk Prescription drug management.   Patient presents to the ED for concern of slurred speech and EKG abnormality, this involves an extensive number of treatment options, and is a complaint that carries with it a high risk of complications and morbidity.  The differential diagnosis includes TIA/CVA, electrolyte abnormality   Co morbidities that complicate the patient evaluation  OP, diverticulosis, HLD, CKD, anemia, carotid stenosis, hypertension   Additional history obtained:  Additional history obtained from Family, Nursing, Outside Medical Records, and Past Admission   External records from outside source obtained and reviewed including  Family at bedside who witnessed slurred speech and facial droop at 1130 today and continue to report that she does not "sound normal" Triage RN note Urgent care note    Lab Tests:  I Ordered, and personally interpreted labs.  The pertinent results include:   Hemoglobin 11.6 (baseline is 11.2-11.8) CBG 133 Creatinine 1.4  (baseline has been 1.08-1.26 since 01/06/2023)   Imaging Studies ordered:  I ordered imaging studies including CT head  I independently visualized and interpreted imaging which showed no ICH I agree with the radiologist interpretation   Cardiac  Monitoring:  The patient was maintained on a cardiac monitor.  I personally viewed and interpreted the cardiac monitored which showed an underlying rhythm of: RBBB   Medicines ordered and prescription drug management:  I ordered medication including ativan  for anxiety  Reevaluation of the patient after these medicines showed that the patient improved I have reviewed the patients home medicines and have made adjustments as needed    Consultations Obtained:  I requested consultation with neuro (Dr. Milon Dikes),  and discussed lab and imaging findings as well as pertinent plan - they recommend: MR brain   Problem List / ED Course:  Slurred speech, facial droop Family reports that patient is still having "mild" slurred speech and that she "does not like her self fully yet" Neurological exam is unremarkable.  She is neurologically intact Consulted neurology who recommends MRI and reassessment following results Pending at signout EKG change Sent from [facility] regarding possible EKG change Video visit from cardiology from 06/17/2023 notes right bundle branch block on her medical history however cardiology visit from 08/30/22 does not show history of RBBB  Right bundle branch block noted on EKG tracing from 12/02/2022 and unchanged from today Patient has no complaints of chest pain, shortness of breath, weakness.  I do not believe that there is any ACS going on   Reevaluation:  After the interventions noted above, I reevaluated the patient and found that they have :stayed the same   Social Determinants of Health:  Has PCP, cardiology f/u   Dispostion:  Signed out to [employer] pending MRI brain results  Final Clinical Impression(s) /  ED Diagnoses Final diagnoses:  Slurred speech  Facial droop  RBBB    Rx / DC Orders ED Discharge Orders     None        Judithann Sheen, PA 08/03/23 3086    Alvira Monday, MD 08/04/23 1117

## 2023-08-03 NOTE — ED Triage Notes (Addendum)
Pt presents with c/o fatigue for the last few days. States she lt leg has been bothering her, has worsened. Reports around 1230 her speech was slurred.   Denies dizziness, HA and chest pain

## 2023-08-03 NOTE — ED Notes (Signed)
..  The patient is A&OX4, ambulatory at d/c with independent steady gait, NAD. Pt verbalized understanding of d/c instructions, prescriptions and follow up care.  

## 2023-08-03 NOTE — ED Provider Notes (Addendum)
[facility]    CSN: [id number] Arrival date & time: 08/03/23  1734      History   Chief Complaint No chief complaint on file.   HPI Amber Barry is a 82 y.o. female.   HPI Patient here today accompanied by family with concerns for worsening fatigue and slurring of speech.  Patient reports around noon today that she noticed that her speech was slurring and irregularity of speech has persisted throughout the day.  Patient denies any dizziness, lightheadedness or generalized weakness.  Past Medical History:  Diagnosis Date   Anemia    pt denies   Anxiety    Arthritis    Osteoarthritis   Bursitis    left hip-had injection 2015   Carotid artery occlusion 11/2012   Left Bruit, 80% blockage   CKD (chronic kidney disease)    Stage III   Diverticulitis    Hematuria    Hypertension    Interstitial cystitis    Lower back pain 01/2014   Ocular migraine    Osteopenia    PONV (postoperative nausea and vomiting)     Patient Active Problem List   Diagnosis Date Noted   Carotid stenosis 01/13/2023   Asymptomatic carotid artery stenosis without infarction, right 01/13/2023   Hypertension 08/12/2018   Osteopenia 08/12/2018   Constipation 12/05/2013   Occlusion and stenosis of carotid artery without mention of cerebral infarction 11/30/2012    Past Surgical History:  Procedure Laterality Date   ENDARTERECTOMY Right 12/22/2012   Procedure: Right Carotid Endarterectomy with hemashield patch angioplasty;  Surgeon: Pryor Ochoa, MD;  Location: [facility] OR;  Service: Vascular;  Laterality: Right;   ENDARTERECTOMY Right 01/13/2023   Procedure: RIGHT ENDARTERECTOMY COMMON CAROTID;  Surgeon: Maeola Harman, MD;  Location: [facility] OR;  Service: Vascular;  Laterality: Right;   PATCH ANGIOPLASTY Right 01/13/2023   Procedure: PATCH ANGIOPLASTY USING HEMASHIELD PLATINUM PATCH 0.8CM X 7.6CM;  Surgeon: Maeola Harman, MD;  Location: [facility] OR;  Service: Vascular;   Laterality: Right;   TONSILLECTOMY  1947    OB History     Gravida  2   Para  2   Term      Preterm      AB      Living  2      SAB      IAB      Ectopic      Multiple      Live Births               Home Medications    Prior to Admission medications   Medication Sig Start Date End Date Taking? Authorizing Provider  acetaminophen (TYLENOL) 500 MG tablet Take 1,000 mg by mouth daily.    [provider]  ALPRAZolam Prudy Feeler) 0.25 MG tablet Take 0.125 mg by mouth 2 (two) times daily as needed for anxiety.     [provider]  aspirin EC 81 MG tablet Take 243 mg by mouth daily.    [provider]  azelastine (ASTELIN) 0.1 % nasal spray Place 2 sprays into both nostrils 2 (two) times daily as needed for rhinitis or allergies. 11/15/14   [provider]  benzonatate (TESSALON) 200 MG capsule Take 200 mg by mouth 3 (three) times daily as needed for cough.    [provider]  Calcium-Vitamin D (CALTRATE 600 PLUS-VIT D PO) Take 1 tablet by mouth daily at 12 noon. Soft chews    [provider]  celecoxib (  CELEBREX) 200 MG capsule Take 200 mg by mouth 2 (two) times a week.    [provider]  cholecalciferol (VITAMIN D3) 25 MCG (1000 UNIT) tablet Take 1,000 Units by mouth daily at 12 noon.    [provider]  escitalopram (LEXAPRO) 20 MG tablet Take 20 mg by mouth daily.    [provider]  hydrochlorothiazide (HYDRODIURIL) 25 MG tablet Take 25 mg by mouth daily.  11/15/14   [provider]  lisinopril (ZESTRIL) 10 MG tablet Take 10 mg by mouth daily.    [provider]  oxyCODONE-acetaminophen (PERCOCET/ROXICET) 5-325 MG tablet Take 1 tablet by mouth every 6 (six) hours as needed for moderate pain. 01/14/23   Baglia, Corrina, PA-C  Probiotic Product (ALIGN) 4 MG CAPS Take 4 mg by mouth daily.    [provider]  Pseudoephedrine HCl (SUDAFED PO) Take 30 mg by mouth daily as  needed (allergies).    [provider]  rosuvastatin (CRESTOR) 5 MG tablet Take 1 tablet (5 mg total) by mouth daily. 06/17/23 06/11/24  Chrystie Nose, MD    Family History Family History  Problem Relation Age of Onset   COPD Mother        Respiratory Disease   Rheumatic fever Mother    Pulmonary disease Mother    Heart attack Mother    Stroke Father    Hypertension Father    Diabetes Paternal Aunt    Prostate cancer Maternal Grandfather    Hypertension Paternal Aunt    Hypertension Paternal Grandmother    Other Maternal Grandmother        hardening arteries   Stroke Other        paternal side    Social History Social History   Tobacco Use   Smoking status: Never    Passive exposure: Never   Smokeless tobacco: Never  Vaping Use   Vaping status: Never Used  Substance Use Topics   Alcohol use: No   Drug use: No     Allergies   Benicar hct [olmesartan medoxomil-hctz], Buspar [buspirone], Norvasc [amlodipine besylate], Zoloft [sertraline hcl], Lipitor [atorvastatin], Lisinopril, Motrin [ibuprofen], Amitriptyline hcl, and Pravastatin   Review of Systems Review of Systems  Constitutional:  Positive for fatigue.  Musculoskeletal:  Positive for myalgias.  Neurological:  Positive for speech difficulty. Negative for dizziness, facial asymmetry, light-headedness, numbness and headaches.     Physical Exam Triage Vital Signs ED Triage Vitals  Encounter Vitals Group     BP 08/03/23 1743 (!) 150/64     Systolic BP Percentile --      Diastolic BP Percentile --      Pulse Rate 08/03/23 1743 99     Resp 08/03/23 1743 18     Temp --      Temp src --      SpO2 --      Weight --      Height --      Head Circumference --      Peak Flow --      Pain Score 08/03/23 1800 0     Pain Loc --      Pain Education --      Exclude from Growth Chart --    No data found.  Updated Vital Signs BP (!) 150/64 (BP Location: Left Leg)   Pulse 99   Resp 18   LMP  08/11/2002   Visual Acuity Right Eye Distance:   Left Eye Distance:   Bilateral Distance:  Right Eye Near:   Left Eye Near:    Bilateral Near:     Physical Exam Constitutional:      Appearance: Normal appearance.  HENT:     Head: Normocephalic and atraumatic.     Comments: Negative for facial asymmetry or facial drooping    Nose: Nose normal.  Eyes:     Extraocular Movements: Extraocular movements intact.     Pupils: Pupils are equal, round, and reactive to light.  Cardiovascular:     Rate and Rhythm: Tachycardia present. Rhythm regularly irregular.  Pulmonary:     Effort: Pulmonary effort is normal.     Breath sounds: Normal breath sounds.  Musculoskeletal:        General: Normal range of motion.     Cervical back: Normal range of motion.     Right lower leg: No edema.     Left lower leg: No edema.  Skin:    General: Skin is warm and dry.  Neurological:     Mental Status: She is alert and oriented to person, place, and time. Mental status is at baseline.     Cranial Nerves: No cranial nerve deficit.     Motor: No weakness.     Coordination: Coordination normal.      UC Treatments / Results  Labs (all labs ordered are listed, but only abnormal results are displayed) Labs Reviewed  POCT FASTING CBG [facility] MANUAL ENTRY - Abnormal; Notable for the following components:      Result Value   POCT Glucose ([facility]) 142 (*)    All other components within normal limits    EKG EKG shows wide QRS complexes and absent P waves, per this writer's interpretation flutter waves noted concern for possible onset of atrial flutter.  BPM 100  Radiology No results found.  Procedures Procedures (including critical care time)  Medications Ordered in UC Medications - No data to display  Initial Impression / Assessment and Plan / UC Course  I have reviewed the triage vital signs and the nursing notes.  Pertinent labs & imaging results that were available during my care of the  patient were reviewed by me and considered in my medical decision making (see chart for details).    Patient in today with acute onset speech abnormalities although no obvious abnormalities of speech noted during evaluation.  However patient has high risk factors for stroke or already a vascular event given history of carotid stenosis and hyperlipidemia and age.  Blood pressure is slightly elevated.  Glucose 142 patient is prediabetic. EKG tracing shows widened QRS complexes and absent of P waves concerned heart is going into an arrhythmia therefore patient warrants emergent evaluation in the setting of the emergency department for further workup and evaluation of neurological and cardiovascular symptoms.  Patient is accompanied by family who will transport her to [facility] emergency department. Final Clinical Impressions(s) / UC Diagnoses   Final diagnoses:  Abnormal ECG  Wide QRS ventricular tachycardia (HCC)  Slurred speech     Discharge Instructions      Go to [facility] for further evaluation of abnormal ECG    ED Prescriptions   None    PDMP not reviewed this encounter.   Bing Neighbors, NP 08/03/23 1819    Bing Neighbors, NP 08/03/23 1820

## 2023-08-03 NOTE — ED Provider Triage Note (Signed)
Emergency Medicine Provider Triage Evaluation Note  Amber Barry , a 82 y.o. female  was evaluated in triage.  Pt complains of difficulty speaking since this am  Review of Systems  Positive:  Negative:   Physical Exam  BP (!) 170/69 (BP Location: Right Arm)   Pulse 85   Temp 98.5 F (36.9 C) (Oral)   Resp 20   Ht 4\' 10"  (1.473 m)   Wt 56.2 kg   LMP 08/11/2002   SpO2 97%   BMI 25.92 kg/m  Gen:   Awake, no distress   Resp:  Normal effort  MSK:   Moves extremities without difficulty  Other:    Medical Decision Making  Medically screening exam initiated at 6:56 PM.  Appropriate orders placed.  Amber Barry was informed that the remainder of the evaluation will be completed by another provider, this initial triage assessment does not replace that evaluation, and the importance of remaining in the ED until their evaluation is complete.     Elson Areas, [place] 08/03/23 1857

## 2023-08-04 ENCOUNTER — Emergency Department (HOSPITAL_COMMUNITY): Payer: Medicare Other

## 2023-08-04 ENCOUNTER — Observation Stay (HOSPITAL_COMMUNITY): Payer: Medicare Other

## 2023-08-04 DIAGNOSIS — E785 Hyperlipidemia, unspecified: Secondary | ICD-10-CM

## 2023-08-04 DIAGNOSIS — I6381 Other cerebral infarction due to occlusion or stenosis of small artery: Secondary | ICD-10-CM | POA: Diagnosis not present

## 2023-08-04 DIAGNOSIS — R531 Weakness: Secondary | ICD-10-CM | POA: Diagnosis not present

## 2023-08-04 DIAGNOSIS — I639 Cerebral infarction, unspecified: Secondary | ICD-10-CM | POA: Diagnosis not present

## 2023-08-04 DIAGNOSIS — I6522 Occlusion and stenosis of left carotid artery: Secondary | ICD-10-CM | POA: Diagnosis not present

## 2023-08-04 DIAGNOSIS — E871 Hypo-osmolality and hyponatremia: Secondary | ICD-10-CM

## 2023-08-04 DIAGNOSIS — I6389 Other cerebral infarction: Secondary | ICD-10-CM | POA: Diagnosis not present

## 2023-08-04 DIAGNOSIS — D649 Anemia, unspecified: Secondary | ICD-10-CM

## 2023-08-04 DIAGNOSIS — I6502 Occlusion and stenosis of left vertebral artery: Secondary | ICD-10-CM | POA: Diagnosis not present

## 2023-08-04 DIAGNOSIS — N183 Chronic kidney disease, stage 3 unspecified: Secondary | ICD-10-CM

## 2023-08-04 DIAGNOSIS — I672 Cerebral atherosclerosis: Secondary | ICD-10-CM | POA: Diagnosis not present

## 2023-08-04 LAB — URINALYSIS, ROUTINE W REFLEX MICROSCOPIC
Bilirubin Urine: NEGATIVE
Glucose, UA: NEGATIVE mg/dL
Hgb urine dipstick: NEGATIVE
Ketones, ur: NEGATIVE mg/dL
Nitrite: NEGATIVE
Protein, ur: NEGATIVE mg/dL
Specific Gravity, Urine: 1.01 (ref 1.005–1.030)
WBC, UA: >50 WBC/hpf (ref 0–5)
pH: 5 (ref 5.0–8.0)

## 2023-08-04 LAB — BASIC METABOLIC PANEL
Anion gap: 11 (ref 5–15)
BUN: 23 mg/dL (ref 8–23)
CO2: 21 mmol/L — ABNORMAL LOW (ref 22–32)
Calcium: 9.5 mg/dL (ref 8.9–10.3)
Chloride: 102 mmol/L (ref 98–111)
Creatinine, Ser: 1.11 mg/dL — ABNORMAL HIGH (ref 0.44–1.00)
GFR, Estimated: 49 mL/min — ABNORMAL LOW (ref >60)
Glucose, Bld: 115 mg/dL — ABNORMAL HIGH (ref 70–99)
Potassium: 3.9 mmol/L (ref 3.5–5.1)
Sodium: 134 mmol/L — ABNORMAL LOW (ref 135–145)

## 2023-08-04 LAB — ECHOCARDIOGRAM COMPLETE
AR max vel: 1.73 cm2
AV Area VTI: 1.87 cm2
AV Area mean vel: 1.65 cm2
AV Mean grad: 7 mm[Hg]
AV Peak grad: 13 mm[Hg]
Ao pk vel: 1.8 m/s
Area-P 1/2: 4.06 cm2
Height: 58 in
S' Lateral: 3 cm
Single Plane A4C EF: 53.9 %
Weight: 1984 [oz_av]

## 2023-08-04 LAB — HEMOGLOBIN A1C
Hgb A1c MFr Bld: 6 % — ABNORMAL HIGH (ref 4.8–5.6)
Mean Plasma Glucose: 125.5 mg/dL

## 2023-08-04 LAB — LIPID PANEL
Cholesterol: 88 mg/dL (ref 0–200)
HDL: 41 mg/dL (ref >40)
LDL Cholesterol: 43 mg/dL (ref 0–99)
Total CHOL/HDL Ratio: 2.1 {ratio}
Triglycerides: 19 mg/dL (ref <150)
VLDL: 4 mg/dL (ref 0–40)

## 2023-08-04 MED ORDER — ENOXAPARIN SODIUM 30 MG/0.3ML IJ SOSY
30.0000 mg | PREFILLED_SYRINGE | INTRAMUSCULAR | Status: DC
  Administered 2023-08-04: 30 mg via SUBCUTANEOUS
  Filled 2023-08-04: qty 0.3

## 2023-08-04 MED ORDER — ASPIRIN 81 MG PO TBEC: 81.0000 mg | DELAYED_RELEASE_TABLET | Freq: Every day | ORAL | 0 refills | Status: DC

## 2023-08-04 MED ORDER — IOHEXOL 350 MG/ML SOLN
75.0000 mL | Freq: Once | INTRAVENOUS | Status: AC | PRN
  Administered 2023-08-04: 75 mL via INTRAVENOUS

## 2023-08-04 MED ORDER — CLOPIDOGREL BISULFATE 75 MG PO TABS: 75.0000 mg | ORAL_TABLET | Freq: Every day | ORAL | 0 refills | Status: DC

## 2023-08-04 MED ORDER — ACETAMINOPHEN 325 MG PO TABS: 650.0000 mg | ORAL_TABLET | Freq: Four times a day (QID) | ORAL | Status: DC | PRN

## 2023-08-04 MED ORDER — ASPIRIN 81 MG PO TBEC
81.0000 mg | DELAYED_RELEASE_TABLET | Freq: Every day | ORAL | Status: DC
Start: 2023-08-04 — End: 2023-08-04
  Administered 2023-08-04: 81 mg via ORAL
  Filled 2023-08-04: qty 1

## 2023-08-04 MED ORDER — ESCITALOPRAM OXALATE 10 MG PO TABS
20.0000 mg | ORAL_TABLET | Freq: Every day | ORAL | Status: DC
  Administered 2023-08-04: 20 mg via ORAL
  Filled 2023-08-04: qty 2

## 2023-08-04 MED ORDER — ROSUVASTATIN CALCIUM 5 MG PO TABS
5.0000 mg | ORAL_TABLET | Freq: Every day | ORAL | Status: DC
  Administered 2023-08-04: 5 mg via ORAL
  Filled 2023-08-04: qty 1

## 2023-08-04 MED ORDER — PERFLUTREN LIPID MICROSPHERE
1.0000 mL | INTRAVENOUS | Status: AC | PRN
  Administered 2023-08-04: 3 mL via INTRAVENOUS

## 2023-08-04 MED ORDER — PANTOPRAZOLE SODIUM 40 MG PO TBEC
40.0000 mg | DELAYED_RELEASE_TABLET | Freq: Every day | ORAL | Status: DC
  Administered 2023-08-04: 40 mg via ORAL
  Filled 2023-08-04: qty 1

## 2023-08-04 MED ORDER — CLOPIDOGREL BISULFATE 75 MG PO TABS
75.0000 mg | ORAL_TABLET | Freq: Every day | ORAL | Status: DC
  Administered 2023-08-04: 75 mg via ORAL
  Filled 2023-08-04: qty 1

## 2023-08-04 MED ORDER — CLOPIDOGREL BISULFATE 75 MG PO TABS: 75.0000 mg | ORAL_TABLET | Freq: Every day | ORAL | 11 refills | Status: AC

## 2023-08-04 MED ORDER — ASPIRIN 81 MG PO TBEC: 81.0000 mg | DELAYED_RELEASE_TABLET | Freq: Every day | ORAL | 12 refills | Status: DC

## 2023-08-04 MED ORDER — STROKE: EARLY STAGES OF RECOVERY BOOK
Freq: Once | Status: DC
  Filled 2023-08-04: qty 1

## 2023-08-04 MED ORDER — ALPRAZOLAM 0.25 MG PO TABS
0.1250 mg | ORAL_TABLET | Freq: Two times a day (BID) | ORAL | Status: DC | PRN
  Administered 2023-08-04: 0.125 mg via ORAL
  Filled 2023-08-04: qty 1

## 2023-08-04 NOTE — Evaluation (Signed)
Physical Therapy Evaluation Patient Details Name: AMIRACLE RZONCA MRN: [id number] DOB: 1940-08-29 Today's Date: 08/04/2023  History of Present Illness  82 y.o. female presents to [facility] hospital on 08/03/2023 with slurred speech and L facial droop. MRI with acute infarct in R lentiform nucleus/external capsule. PMH includes anxiety, OA, CKD III, diverticulitis, HTN.  Clinical Impression  Pt presents to PT with deficits in communication, but otherwise does not appear to be far from her baseline in regards to mobility. PT notes no focal deficits in strength or sensation. Pt is able to ambulate independently, at a slower rate more indicative of a household ambulator but no different than baseline per pt. Pt and pt's son due report chronic OA which has seemed to progress recently and affected her mobility. Pt will follow up with her PCP about this and elect to pursue outpatient PT at a later time. Pt does not have any immediate post-acute PT needs.        If plan is discharge home, recommend the following:     Can travel by private vehicle        Equipment Recommendations None recommended by PT  Recommendations for Other Services       Functional Status Assessment Patient has had a recent decline in their functional status and demonstrates the ability to make significant improvements in function in a reasonable and predictable amount of time.     Precautions / Restrictions Precautions Precautions: None Restrictions Weight Bearing Restrictions Per Provider Order: No      Mobility  Bed Mobility Overal bed mobility: Independent                  Transfers Overall transfer level: Independent                      Ambulation/Gait Ambulation/Gait assistance: Modified independent (Device/Increase time) Gait Distance (Feet): 400 Feet Assistive device: None Gait Pattern/deviations: Step-through pattern Gait velocity: reduced Gait velocity interpretation: 1.31 - 2.62  ft/sec, indicative of limited community ambulator   General Gait Details: pt with slowed step-through gait, tolerates head turns, changes in direction. pt is able to minimally increase gait speed.  Stairs            Wheelchair Mobility     Tilt Bed    Modified Rankin (Stroke Patients Only) Modified Rankin (Stroke Patients Only) Pre-Morbid Rankin Score: No symptoms Modified Rankin: No significant disability     Balance Overall balance assessment: Modified Independent                                           Pertinent Vitals/Pain Pain Assessment Pain Assessment: No/denies pain    Home Living Family/patient expects to be discharged to:: Private residence Living Arrangements: Alone Available Help at Discharge: Family;Available PRN/intermittently Type of Home: House Home Access: Stairs to enter Entrance Stairs-Rails: Can reach both Entrance Stairs-Number of Steps: 2   Home Layout: One level Home Equipment: Agricultural consultant (2 wheels);Cane - single point;Shower seat;Hand held shower head      Prior Function Prior Level of Function : Independent/Modified Independent;Driving                     Extremity/Trunk Assessment   Upper Extremity Assessment Upper Extremity Assessment: Overall WFL for tasks assessed    Lower Extremity Assessment Lower Extremity Assessment: Overall WFL for tasks assessed  Cervical / Trunk Assessment Cervical / Trunk Assessment: Normal  Communication   Communication Communication: No apparent difficulties (mild dysarthria) Cueing Techniques: Verbal cues  Cognition Arousal: Alert Behavior During Therapy: WFL for tasks assessed/performed Overall Cognitive Status: Within Functional Limits for tasks assessed                                          General Comments General comments (skin integrity, edema, etc.): VSS on RA    Exercises     Assessment/Plan    PT Assessment Patient needs  continued PT services  PT Problem List Decreased activity tolerance;Pain       PT Treatment Interventions Gait training;Balance training;Therapeutic exercise    PT Goals (Current goals can be found in the Care Plan section)  Acute Rehab PT Goals Patient Stated Goal: to go home PT Goal Formulation: With patient Time For Goal Achievement: 08/18/23 Potential to Achieve Goals: Good Additional Goals Additional Goal #1: Pt will score >19/24 on the DGI to indicate a reduced risk for falls    Frequency Min 1X/week     Co-evaluation               AM-PAC PT "6 Clicks" Mobility  Outcome Measure Help needed turning from your back to your side while in a flat bed without using bedrails?: None Help needed moving from lying on your back to sitting on the side of a flat bed without using bedrails?: None Help needed moving to and from a bed to a chair (including a wheelchair)?: None Help needed standing up from a chair using your arms (e.g., wheelchair or bedside chair)?: None Help needed to walk in hospital room?: None Help needed climbing 3-5 steps with a railing? : None 6 Click Score: 24    End of Session Equipment Utilized During Treatment: Gait belt Activity Tolerance: Patient tolerated treatment well Patient left: in bed;with call bell/phone within reach Nurse Communication: Mobility status PT Visit Diagnosis: Other abnormalities of gait and mobility (R26.89)    Time: 5284-1324 PT Time Calculation (min) (ACUTE ONLY): 19 min   Charges:   PT Evaluation $PT Eval Low Complexity: 1 Low   PT General Charges $$ ACUTE PT VISIT: 1 Visit         Arlyss Gandy, PT, DPT Acute Rehabilitation Office [phone]   Arlyss Gandy 08/04/2023, 11:54 AM

## 2023-08-04 NOTE — Progress Notes (Addendum)
STROKE TEAM PROGRESS NOTE   SUBJECTIVE (INTERVAL HISTORY) Her daughter and son are at the bedside.  Overall her condition is gradually improving.  Patient still has mild left facial droop and mild slurred speech.  But denies any arm or leg weakness.   OBJECTIVE Temp:  [98 F (36.7 C)-99.1 F (37.3 C)] 98.6 F (37 C) (12/24 1534) Pulse Rate:  [72-112] 84 (12/24 1534) Cardiac Rhythm: Normal sinus rhythm (12/24 1432) Resp:  [17-26] 18 (12/24 1534) BP: (110-170)/(62-82) 156/71 (12/24 1534) SpO2:  [96 %-100 %] 97 % (12/24 1534) FiO2 (%):  [100 %] 100 % (12/24 1402) Weight:  [56.2 kg] 56.2 kg (12/23 1832)  Recent Labs  Lab 08/03/23 2106  GLUCAP 101*   Recent Labs  Lab 08/03/23 1840 08/03/23 1905 08/04/23 0444  NA 133* 137 134*  K 4.5 4.4 3.9  CL 102 103 102  CO2 23  --  21*  GLUCOSE 134* 133* 115*  BUN 28* 30* 23  CREATININE 1.26* 1.40* 1.11*  CALCIUM 9.7  --  9.5   Recent Labs  Lab 08/03/23 1840  AST 21  ALT 13  ALKPHOS 59  BILITOT 0.2  PROT 7.3  ALBUMIN 3.8   Recent Labs  Lab 08/03/23 1840 08/03/23 1905  WBC 6.5  --   NEUTROABS 3.9  --   HGB 11.2* 11.6*  HCT 34.3* 34.0*  MCV 92.0  --   PLT 230  --    No results for input(s): "CKTOTAL", "CKMB", "CKMBINDEX", "TROPONINI" in the last 168 hours. Recent Labs    08/03/23 1840  LABPROT 14.3  INR 1.1   Recent Labs    08/03/23 2353  COLORURINE STRAW*  LABSPEC 1.010  PHURINE 5.0  GLUCOSEU NEGATIVE  HGBUR NEGATIVE  BILIRUBINUR NEGATIVE  KETONESUR NEGATIVE  PROTEINUR NEGATIVE  NITRITE NEGATIVE  LEUKOCYTESUR LARGE*       Component Value Date/Time   CHOL 88 08/04/2023 0444   TRIG 19 08/04/2023 0444   HDL 41 08/04/2023 0444   CHOLHDL 2.1 08/04/2023 0444   VLDL 4 08/04/2023 0444   LDLCALC 43 08/04/2023 0444   Lab Results  Component Value Date   HGBA1C 6.0 (H) 08/04/2023      Component Value Date/Time   LABOPIA NONE DETECTED 08/28/2022 2238   COCAINSCRNUR NONE DETECTED 08/28/2022 2238    LABBENZ NONE DETECTED 08/28/2022 2238   AMPHETMU NONE DETECTED 08/28/2022 2238   THCU NONE DETECTED 08/28/2022 2238   LABBARB NONE DETECTED 08/28/2022 2238    Recent Labs  Lab 08/03/23 1859  ETH <10    I have personally reviewed the radiological images below and agree with the radiology interpretations.  ECHOCARDIOGRAM COMPLETE Result Date: 08/04/2023    ECHOCARDIOGRAM REPORT   Patient Name:   TANYJAH DUNSTON Date of Exam: 08/04/2023 Medical Rec #:  [id number]      Height:       58.0 in Accession #:    [id number]     Weight:       124.0 lb Date of Birth:  25-Mar-1941      BSA:          1.486 m Patient Age:    82 years       BP:           110/79 mmHg Patient Gender: F              HR:           82 bpm. Exam Location:  Inpatient Procedure: 2D Echo, Cardiac  Doppler, Color Doppler and Intracardiac            Opacification Agent Indications:    Stroke  History:        Patient has no prior history of Echocardiogram examinations.                 Risk Factors:Hypertension.  Sonographer:    Karma Ganja Referring Phys: [id number] QIONGEXBM RATHORE  Sonographer Comments: Technically challenging study due to limited acoustic windows. Image acquisition challenging due to respiratory motion. IMPRESSIONS  1. Left ventricular ejection fraction, by estimation, is 55 to 60%. The left ventricle has normal function. The left ventricle has no regional wall motion abnormalities. Left ventricular diastolic parameters are consistent with Grade I diastolic dysfunction (impaired relaxation).  2. Right ventricular systolic function is normal. The right ventricular size is normal. There is mildly elevated pulmonary artery systolic pressure. The estimated right ventricular systolic pressure is 44.5 mmHg.  3. The mitral valve is normal in structure. Trivial mitral valve regurgitation.  4. Color evaluation of TR technically difficult.  5. The aortic valve was not well visualized. Aortic valve regurgitation is not visualized.  6. The  inferior vena cava is normal in size with greater than 50% respiratory variability, suggesting right atrial pressure of 3 mmHg. Comparison(s): No prior Echocardiogram. FINDINGS  Left Ventricle: Left ventricular ejection fraction, by estimation, is 55 to 60%. The left ventricle has normal function. The left ventricle has no regional wall motion abnormalities. Definity contrast agent was given IV to delineate the left ventricular  endocardial borders. The left ventricular internal cavity size was normal in size. There is no left ventricular hypertrophy. Left ventricular diastolic parameters are consistent with Grade I diastolic dysfunction (impaired relaxation). Right Ventricle: The right ventricular size is normal. No increase in right ventricular wall thickness. Right ventricular systolic function is normal. There is mildly elevated pulmonary artery systolic pressure. The tricuspid regurgitant velocity is 3.22  m/s, and with an assumed right atrial pressure of 3 mmHg, the estimated right ventricular systolic pressure is 44.5 mmHg. Left Atrium: Left atrial size was normal in size. Right Atrium: Right atrial size was normal in size. Pericardium: There is no evidence of pericardial effusion. Presence of epicardial fat layer. Mitral Valve: The mitral valve is normal in structure. Trivial mitral valve regurgitation. Tricuspid Valve: Color evaluation of TR technically difficult. The tricuspid valve is not well visualized. Tricuspid valve regurgitation is mild. Aortic Valve: The aortic valve was not well visualized. Aortic valve regurgitation is not visualized. Aortic valve mean gradient measures 7.0 mmHg. Aortic valve peak gradient measures 13.0 mmHg. Aortic valve area, by VTI measures 1.87 cm. Pulmonic Valve: The pulmonic valve was normal in structure. Pulmonic valve regurgitation is not visualized. Aorta: The ascending aorta was not well visualized and the aortic root is normal in size and structure. Venous: The  inferior vena cava is normal in size with greater than 50% respiratory variability, suggesting right atrial pressure of 3 mmHg. IAS/Shunts: No atrial level shunt detected by color flow Doppler.  LEFT VENTRICLE PLAX 2D LVIDd:         4.10 cm     Diastology LVIDs:         3.00 cm     LV e' medial:    5.28 cm/s LV PW:         1.00 cm     LV E/e' medial:  18.2 LV IVS:        1.00 cm     LV  e' lateral:   10.40 cm/s LVOT diam:     2.00 cm     LV E/e' lateral: 9.2 LV SV:         65 LV SV Index:   44 LVOT Area:     3.14 cm  LV Volumes (MOD) LV vol d, MOD A4C: 61.8 ml LV vol s, MOD A4C: 28.5 ml LV SV MOD A4C:     61.8 ml RIGHT VENTRICLE             IVC RV Basal diam:  3.60 cm     IVC diam: 1.40 cm RV S prime:     10.40 cm/s TAPSE (M-mode): 2.3 cm LEFT ATRIUM             Index        RIGHT ATRIUM           Index LA diam:        2.90 cm 1.95 cm/m   RA Area:     14.00 cm LA Vol (A2C):   67.0 ml 45.08 ml/m  RA Volume:   32.80 ml  22.07 ml/m LA Vol (A4C):   36.9 ml 24.83 ml/m LA Biplane Vol: 50.1 ml 33.71 ml/m  AORTIC VALVE AV Area (Vmax):    1.73 cm AV Area (Vmean):   1.65 cm AV Area (VTI):     1.87 cm AV Vmax:           180.00 cm/s AV Vmean:          127.000 cm/s AV VTI:            0.347 m AV Peak Grad:      13.0 mmHg AV Mean Grad:      7.0 mmHg LVOT Vmax:         99.20 cm/s LVOT Vmean:        66.800 cm/s LVOT VTI:          0.207 m LVOT/AV VTI ratio: 0.60  AORTA Ao Root diam: 3.20 cm MITRAL VALVE                TRICUSPID VALVE MV Area (PHT): 4.06 cm     TR Peak grad:   41.5 mmHg MV Decel Time: 187 msec     TR Vmax:        322.00 cm/s MV E velocity: 96.00 cm/s MV A velocity: 129.00 cm/s  SHUNTS MV E/A ratio:  0.74         Systemic VTI:  0.21 m                             Systemic Diam: 2.00 cm Riley Lam MD Electronically signed by Riley Lam MD Signature Date/Time: 08/04/2023/11:51:13 AM    Final    CT ANGIO HEAD NECK W WO CM Result Date: 08/04/2023 CLINICAL DATA:  82 year old female with  neurologic deficit, right basal ganglia lacunar infarct on MRI 0100 hours today. EXAM: CT ANGIOGRAPHY HEAD AND NECK WITH AND WITHOUT CONTRAST TECHNIQUE: Multidetector CT imaging of the head and neck was performed using the standard protocol during bolus administration of intravenous contrast. Multiplanar CT image reconstructions and MIPs were obtained to evaluate the vascular anatomy. Carotid stenosis measurements (when applicable) are obtained utilizing NASCET criteria, using the distal internal carotid diameter as the denominator. RADIATION DOSE REDUCTION: This exam was performed according to the departmental dose-optimization program which includes automated exposure control, adjustment of the mA and/or kV according  to patient size and/or use of iterative reconstruction technique. CONTRAST:  75mL OMNIPAQUE IOHEXOL 350 MG/ML SOLN COMPARISON:  Brain MRI 0100 hours today. Head CT yesterday. Previous CTA head and neck 08/28/2022. FINDINGS: CT HEAD Brain: Subtle hypodensity in the right basal ganglia corresponding to DWI positive lacunar infarct on MRI (series 6, image 16). No associated hemorrhage or mass effect. Elsewhere Stable non contrast CT appearance of the brain. Calvarium and skull base: No acute osseous abnormality identified. Paranasal sinuses: Visualized paranasal sinuses and mastoids are stable and well aerated. Orbits: No acute orbit or scalp soft tissue finding. CTA NECK Skeleton: Bulky cervical spine degeneration, including developing degenerative facet ankylosis bilaterally at C3-C4. Advanced sternoclavicular degeneration. No acute osseous abnormality identified. Upper chest: Upper lung subpleural scarring is stable. Negative visible superior mediastinum. Other neck: No acute finding. Aortic arch: 3 vessel arch with no significant arch atherosclerosis visible. Right carotid system: Previous right carotid endarterectomy with capacious proximal right ICA. No significant stenosis to the skull base. Left  carotid system: Negative left CCA and carotid bifurcation. Mild to moderate left ICA bulb calcified plaque with less than 50 % stenosis with respect to the distal vessel. Vertebral arteries: Mild right subclavian origin plaque without stenosis. Normal right vertebral artery origin. Right vertebral artery is mildly dominant, patent to the skull base with no significant plaque or stenosis. Mild proximal left subclavian plaque without stenosis. Normal left vertebral artery origin. Tortuous left V1 segment. Non dominant although fairly normal size left vertebral artery is patent to the skull base with no significant plaque or stenosis. CTA HEAD Posterior circulation: Left vertebral artery mild to moderate atherosclerosis and stenosis as it crosses the dura is stable since January on series 12, image 139. Non dominant left V4 segment than is normal to the vertebrobasilar junction. Normal left PICA origin. Minimal distal right vertebral artery plaque without stenosis. Dominant right V4 segment. Normal right PICA origin. Normal vertebrobasilar junction. Patent basilar artery without stenosis. Patent SCA and PCA origins. Posterior communicating arteries are diminutive or absent. Bilateral PCA branches are within normal limits. Anterior circulation: Both ICA siphons are patent. Left siphon moderate mostly cavernous segment calcified plaque, only mild left siphon stenosis. Similar right siphon moderate calcified plaque at the distal cavernous segment, anterior genu. Mild to moderate stenosis there. Patent carotid termini, MCA and ACA origins. Diminutive or absent anterior communicating artery. Bilateral ACA branches are stable and within normal limits. Left MCA M1 segment and trifurcation are patent without stenosis. Left MCA branches are stable and within normal limits. Right MCA M1 segment and trifurcation are patent without stenosis. Right MCA branches are stable and within normal limits. Venous sinuses: Patent. Anatomic  variants: Dominant right vertebral artery. Review of the MIP images confirms the above findings IMPRESSION: 1. Negative for large vessel occlusion and stable CTA since January: - Previous right carotid endarterectomy with no adverse features. - bilateral ICA siphon and left ICA bulb calcified atherosclerosis with mild to moderate right siphon stenosis. - dominant right vertebral artery with minimal atherosclerosis. Stable mild to moderate distal left vertebral artery atherosclerotic stenosis at the skull base. 2. Expected CT appearance of right basal ganglia lacunar infarct with no hemorrhagic transformation or mass effect. No new intracranial abnormality. Electronically Signed   By: Odessa Fleming M.D.   On: 08/04/2023 08:01   MR BRAIN WO CONTRAST Result Date: 08/04/2023 CLINICAL DATA:  Stroke suspected, facial paralysis and weakness EXAM: MRI HEAD WITHOUT CONTRAST TECHNIQUE: Multiplanar, multiecho pulse sequences of the brain and surrounding  structures were obtained without intravenous contrast. COMPARISON:  08/29/2022 MRI head, correlation is also made with 08/03/2023 CT head FINDINGS: Brain: Restricted diffusion with ADC correlate in the right lentiform nucleus/external capsule (series 2, images 25-31), compatible with acute infarct. The series associated with increased T2 hyperintense signal. No acute hemorrhage, mass, mass effect, or midline shift. No hydrocephalus or extra-axial collection. No hemosiderin deposition to suggest remote hemorrhage. Scattered T2 hyperintense signal in the periventricular white matter and pons, likely the sequela of mild chronic small vessel ischemic disease. Vascular: Normal arterial flow voids. Skull and upper cervical spine: Normal marrow signal. Sinuses/Orbits: Clear paranasal sinuses. No acute finding in the orbits. Other: The mastoid air cells are well aerated. IMPRESSION: Acute infarct in the right lentiform nucleus/external capsule. No evidence of hemorrhage. These results  will be called to the ordering clinician or representative by the Radiologist Assistant, and communication documented in the PACS or [employer]. Electronically Signed   By: Wiliam Ke M.D.   On: 08/04/2023 02:28   CT Head Wo Contrast Result Date: 08/03/2023 CLINICAL DATA:  Facial paralysis and weakness. EXAM: CT HEAD WITHOUT CONTRAST TECHNIQUE: Contiguous axial images were obtained from the base of the skull through the vertex without intravenous contrast. RADIATION DOSE REDUCTION: This exam was performed according to the departmental dose-optimization program which includes automated exposure control, adjustment of the mA and/or kV according to patient size and/or use of iterative reconstruction technique. COMPARISON:  MRI brain 08/29/2022.  CT head 08/28/2022 FINDINGS: Brain: No evidence of acute infarction, hemorrhage, hydrocephalus, extra-axial collection or mass lesion/mass effect. Vascular: No hyperdense vessel or unexpected calcification. Skull: Normal. Negative for fracture or focal lesion. Sinuses/Orbits: Paranasal sinuses and mastoid air cells are clear. Other: None. IMPRESSION: No acute intracranial abnormalities. Electronically Signed   By: Burman Nieves M.D.   On: 08/03/2023 20:36     PHYSICAL EXAM  Temp:  [98 F (36.7 C)-99.1 F (37.3 C)] 98.6 F (37 C) (12/24 1534) Pulse Rate:  [72-112] 84 (12/24 1534) Resp:  [17-26] 18 (12/24 1534) BP: (110-170)/(62-82) 156/71 (12/24 1534) SpO2:  [96 %-100 %] 97 % (12/24 1534) FiO2 (%):  [100 %] 100 % (12/24 1402) Weight:  [56.2 kg] 56.2 kg (12/23 1832)  General - Well nourished, well developed, in no apparent distress.  Ophthalmologic - fundi not visualized due to noncooperation.  Cardiovascular - Regular rhythm and rate.  Mental Status -  Level of arousal and orientation to time, place, and person were intact. Language including expression, naming, repetition, comprehension was assessed and found intact.  Fund of Knowledge  was assessed and was intact.  Cranial Nerves II - XII - II - Visual field intact OU. III, IV, VI - Extraocular movements intact. V - Facial sensation intact bilaterally. VII - mild left facial droop VIII - Hearing & vestibular intact bilaterally. X - Palate elevates symmetrically.  Mild dysarthria XI - Chin turning & shoulder shrug intact bilaterally. XII - Tongue protrusion intact.  Motor Strength - The patient's strength was normal in all extremities and pronator drift was absent.  Bulk was normal and fasciculations were absent.   Motor Tone - Muscle tone was assessed at the neck and appendages and was normal.  Reflexes - The patient's reflexes were symmetrical in all extremities and she had no pathological reflexes.  Sensory - Light touch, temperature/pinprick were assessed and were symmetrical.    Coordination - The patient had normal movements in the right hand and feet with no ataxia or dysmetria.  Slight  dysmetria on the left finger-to-nose.  Tremor was absent.  Gait and Station - deferred.   ASSESSMENT/PLAN Ms. AMBREEN VUU is a 82 y.o. female with history of hypertension, CKD, lower back pain, ocular migraine, right CEA x 2 admitted for left facial droop, slurred speech and fatigue. No tPA given due to outside window.    Stroke:  right external capsule/CR infarct likely secondary to small vessel disease source CT no acute abnormality MRI Small right external capsule and CR infarct CT head and neck status post right CEA, atherosclerosis left ICA bulb and bilateral ICA siphon 2D Echo EF 55 to 60% LDL 43 HgbA1c 6.0 Lovenox for VTE prophylaxis aspirin 81 mg daily prior to admission, now on aspirin 81 mg daily and clopidogrel 75 mg daily DAPT for 3 weeks and then Plavix alone. Ongoing aggressive stroke risk factor management Therapy recommendations: None Disposition: Home  Hypertension Stable Avoid low BP Long term BP goal normotensive  Hyperlipidemia Home meds:  Crestor 5 LDL 43, goal < 70 Now on Crestor 5 Continue statin at discharge  Other Stroke Risk Factors Advanced age  Other Active Problems S/p R CEA in 2014 and 01/2023, no history of stroke Lower back pain Ocular migraine, last event at least 6 months ago per daughter CKD 3A, creatinine 1.40--1.11  Hospital day # 0  Neurology will sign off. Please call with questions. Pt will follow up with stroke clinic NP at [facility] in about 4 weeks. Thanks for the consult.  Marvel Plan, MD PhD Stroke Neurology 08/04/2023 5:56 PM  I discussed with Dr. Kirke Corin. I spent additional 30 inpatient minute face-to-face time with the patient and her daughter and son, more than 50% of which was spent in counseling and coordination of care, reviewing test results, images and medication, and discussing the diagnosis, treatment plan and potential prognosis. This patient's care requiresreview of multiple databases, neurological assessment, discussion with family, other specialists and medical decision making of high complexity.    To contact Stroke Continuity provider, please refer to [email or web address]. After hours, contact General Neurology

## 2023-08-04 NOTE — Evaluation (Signed)
Occupational Therapy Evaluation Patient Details Name: Amber Barry MRN: [id number] DOB: November 20, 1940 Today's Date: 08/04/2023   History of Present Illness 82 y.o. female presents to [facility] hospital on 08/03/2023 with slurred speech and L facial droop. MRI with acute infarct in R lentiform nucleus/external capsule. PMH includes anxiety, OA, CKD III, diverticulitis, HTN.   Clinical Impression   Patient admitted for the diagnosis above.  PTA she lives alone, and remains independent with all ADL,iADL, mobility and continues to drive.  Patient is very close to her baseline.  OT will continue efforts in the acute setting to ensure no functional changes.  No post acute OT is recommended.  BEFAST reviewed.         If plan is discharge home, recommend the following: Assist for transportation    Functional Status Assessment  Patient has not had a recent decline in their functional status  Equipment Recommendations  None recommended by OT    Recommendations for Other Services       Precautions / Restrictions Precautions Precautions: None Restrictions Weight Bearing Restrictions Per Provider Order: No      Mobility Bed Mobility Overal bed mobility: Independent                  Transfers Overall transfer level: Independent                        Balance Overall balance assessment: No apparent balance deficits (not formally assessed)                                         ADL either performed or assessed with clinical judgement   ADL                                         General ADL Comments: Generalized supervision with no AD or physical assist     Vision Baseline Vision/History: 1 Wears glasses Patient Visual Report: No change from baseline       Perception Perception: Within Functional Limits       Praxis Praxis: WFL       Pertinent Vitals/Pain Pain Assessment Pain Assessment: No/denies pain      Extremity/Trunk Assessment Upper Extremity Assessment Upper Extremity Assessment: Overall WFL for tasks assessed   Lower Extremity Assessment Lower Extremity Assessment: Defer to PT evaluation   Cervical / Trunk Assessment Cervical / Trunk Assessment: Normal   Communication Communication Communication: No apparent difficulties Cueing Techniques: Verbal cues   Cognition Arousal: Alert Behavior During Therapy: WFL for tasks assessed/performed Overall Cognitive Status: Within Functional Limits for tasks assessed                                       General Comments  VSS on RA    Exercises     Shoulder Instructions      Home Living Family/patient expects to be discharged to:: Private residence Living Arrangements: Alone Available Help at Discharge: Family;Available PRN/intermittently Type of Home: House Home Access: Stairs to enter [employer] of Steps: 2 Entrance Stairs-Rails: Can reach both Home Layout: One level     Bathroom Shower/Tub: Chief Strategy Officer: Standard  Home Equipment: Agricultural consultant (2 wheels);Cane - single point;Shower seat;Hand held shower head          Prior Functioning/Environment Prior Level of Function : Independent/Modified Independent;Driving                        OT Problem List: Decreased activity tolerance      OT Treatment/Interventions: Self-care/ADL training;Therapeutic activities    OT Goals(Current goals can be found in the care plan section) Acute Rehab OT Goals Patient Stated Goal: Return home OT Goal Formulation: With patient Time For Goal Achievement: 08/18/23 Potential to Achieve Goals: Good ADL Goals Pt Will Perform Grooming: Independently Pt Will Perform Lower Body Dressing: Independently Pt Will Transfer to Toilet: Independently  OT Frequency: Min 1X/week    Co-evaluation              AM-PAC OT "6 Clicks" Daily Activity     Outcome Measure  Help from another person eating meals?: None Help from another person taking care of personal grooming?: None Help from another person toileting, which includes using toliet, bedpan, or urinal?: A Little Help from another person bathing (including washing, rinsing, drying)?: A Little Help from another person to put on and taking off regular upper body clothing?: None Help from another person to put on and taking off regular lower body clothing?: A Little 6 Click Score: 21   End of Session Equipment Utilized During Treatment: Gait belt Nurse Communication: Mobility status  Activity Tolerance: Patient tolerated treatment well Patient left: in bed;with call bell/phone within reach;with family/visitor present  OT Visit Diagnosis: Other symptoms and signs involving the nervous system (R29.898)                Time: 1610-9604 OT Time Calculation (min): 18 min Charges:  OT General Charges $OT Visit: 1 Visit OT Evaluation $OT Eval Moderate Complexity: 1 Mod  08/04/2023  RP, OTR/L  Acute Rehabilitation Services  Office:  [phone]   Suzanna Obey 08/04/2023, 12:21 PM

## 2023-08-04 NOTE — Progress Notes (Incomplete)
Progress Note   Patient: Amber Barry ZSW:[id number] DOB: [birth year] DOA: 08/03/2023     0 DOS: the patient was seen and examined on 08/04/2023   Brief hospital course: Amber Barry is a 82 y.o. female with medical history significant of severe right internal carotid artery stenosis status post right carotid endarterectomy in 2014 and redo right carotid endarterectomy in June 2024, RBBB, CKD stage III, hypertension, hyperlipidemia, anemia, anxiety, arthritis presented to the ED with complaints of slurred speech and left facial droop since 11:30 AM yesterday. Blood pressure 170/69 on arrival to the ED, remainder of vital signs stable.  Labs showing no leukocytosis, hemoglobin 11.2 (stable), sodium 133 (chronically low and stable), glucose 134, BUN 28, creatinine 1.2 (baseline 1.0), ethanol <10. UA with negative nitrite, large amount of leukocytes, and microscopy showing >50 WBCs and rare bacteria. CT head negative for acute intracranial abnormality. Brain MRI showing acute infarct in the right lentiform nucleus/external capsule without evidence of hemorrhage. Patient was given Ativan 1 mg in the ED.  Neurology consulted (Dr. Wilford Corner). [facility] called to admit.   Assessment and Plan:  # Acute CVA Patient presenting with complaints of slurred speech and left facial droop. Brain MRI showing acute infarct in the right lentiform nucleus/external capsule without evidence of hemorrhage. -Neurology following, appreciate recs -Recommends DAPT with ASA 81 mg plus Plavix 75 mg for now, length to be determined later -CTA head and neck negative for LVO -Telemetry monitoring -TTE still pending -Hemoglobin A1c 6.0%, LDL 43 -Frequent neurochecks -PT, OT, speech therapy.   # Asymptomatic bacteriuria UA with negative nitrite, large amount of leukocytes, and microscopy showing >50 WBCs and rare bacteria.  Patient is not endorsing any urinary symptoms.  -Urine culture pending   # CKD stage III Creatinine 1.26 on  admission, slightly improved to 1.11. Baseline appears to be around 1.0.  -Continue to hold hydrochlorothiazide and lisinopril at this time. -Avoid nephrotoxic agents   # Hypertension -Remains normotensive   # Hyperlipidemia -Continue Crestor.   # Chronic anemia -Hemoglobin stable.   # Chronic mild hyponatremia -Stable, continue to monitor labs.   # Anxiety -Continue home Lexapro and Xanax PRN.   # GERD -Continue PPI.    {Tip this will not be part of the note when signed Body mass index is 25.92 kg/m. , ,  (Optional):26781}  Subjective: ***  Physical Exam: Vitals:   08/03/23 2330 08/03/23 2346 08/04/23 0316 08/04/23 0515  BP: 128/69  127/82 110/69  Pulse: 91  84 76  Resp: 17  (!) 24 (!) 24  Temp:  98.6 F (37 C) 98 F (36.7 C)   TempSrc:  Axillary Oral   SpO2: 97%  96% 96%  Weight:      Height:       Data Reviewed: Pertinent Labs:    Latest Ref Rng & Units 08/03/2023    7:05 PM 08/03/2023    6:40 PM 01/14/2023    3:27 AM  CBC  WBC 4.0 - 10.5 K/uL  6.5  10.5   Hemoglobin 12.0 - 15.0 g/dL 32.2  02.5  9.2   Hematocrit 36.0 - 46.0 % 34.0  34.3  27.6   Platelets 150 - 400 K/uL  230  136        Latest Ref Rng & Units 08/04/2023    4:44 AM 08/03/2023    7:05 PM 08/03/2023    6:40 PM  CMP  Glucose 70 - 99 mg/dL 427  062  376   BUN 8 -  23 mg/dL 23  30  28    Creatinine 0.44 - 1.00 mg/dL 6.94  8.54  6.27   Sodium 135 - 145 mmol/L 134  137  133   Potassium 3.5 - 5.1 mmol/L 3.9  4.4  4.5   Chloride 98 - 111 mmol/L 102  103  102   CO2 22 - 32 mmol/L 21   23   Calcium 8.9 - 10.3 mg/dL 9.5   9.7   Total Protein 6.5 - 8.1 g/dL   7.3   Total Bilirubin <1.2 mg/dL   0.2   Alkaline Phos 38 - 126 U/L   59   AST 15 - 41 U/L   21   ALT 0 - 44 U/L   13     Family Communication: ***  Disposition: Status is: Observation {Observation:23811}  Planned Discharge Destination: {DISCHARGE DESTINATION_TRH:27031} {Tip this will not be part of the note when signed  DVT  Prophylaxis  ., Enoxaparin (lovenox) injection 30 mg  (Optional):26781}   Time spent: *** minutes  Author: Steffanie Rainwater, MD 08/04/2023 8:57 AM  For on call review [email or web address].

## 2023-08-04 NOTE — Evaluation (Signed)
Speech Language Pathology Evaluation Patient Details Name: Amber Barry MRN: [id number] DOB: 11-13-1940 Today's Date: 08/04/2023 Time: 5643-3295 SLP Time Calculation (min) (ACUTE ONLY): 31 min  Problem List:  Patient Active Problem List   Diagnosis Date Noted   Acute CVA (cerebrovascular accident) (HCC) 08/04/2023   CKD (chronic kidney disease) stage 3, GFR 30-59 ml/min (HCC) 08/04/2023   HLD (hyperlipidemia) 08/04/2023   Anemia 08/04/2023   Hyponatremia 08/04/2023   Carotid stenosis 01/13/2023   Asymptomatic carotid artery stenosis without infarction, right 01/13/2023   Hypertension 08/12/2018   Osteopenia 08/12/2018   Constipation 12/05/2013   Occlusion and stenosis of carotid artery without mention of cerebral infarction 11/30/2012   Past Medical History:  Past Medical History:  Diagnosis Date   Anemia    pt denies   Anxiety    Arthritis    Osteoarthritis   Bursitis    left hip-had injection 2015   Carotid artery occlusion 11/2012   Left Bruit, 80% blockage   CKD (chronic kidney disease)    Stage III   Diverticulitis    Hematuria    Hypertension    Interstitial cystitis    Lower back pain 01/2014   Ocular migraine    Osteopenia    PONV (postoperative nausea and vomiting)    Past Surgical History:  Past Surgical History:  Procedure Laterality Date   ENDARTERECTOMY Right 12/22/2012   Procedure: Right Carotid Endarterectomy with hemashield patch angioplasty;  Surgeon: Pryor Ochoa, MD;  Location: [facility] OR;  Service: Vascular;  Laterality: Right;   ENDARTERECTOMY Right 01/13/2023   Procedure: RIGHT ENDARTERECTOMY COMMON CAROTID;  Surgeon: Maeola Harman, MD;  Location: [facility] OR;  Service: Vascular;  Laterality: Right;   PATCH ANGIOPLASTY Right 01/13/2023   Procedure: PATCH ANGIOPLASTY USING HEMASHIELD PLATINUM PATCH 0.8CM X 7.6CM;  Surgeon: Maeola Harman, MD;  Location: [facility] OR;  Service: Vascular;  Laterality: Right;   TONSILLECTOMY  1947   HPI:   82 y.o. female presents to [facility] hospital on 08/03/2023 with slurred speech and L facial droop. MRI with acute infarct in R lentiform nucleus/external capsule. PMH includes anxiety, OA, CKD III, diverticulitis, HTN.   Assessment / Plan / Recommendation Clinical Impression  Pt scored 26/30 on the SLUMS, which is just below the range of normal (27 or above); however, in conversation with pt and family, this appears to be her baseline. Her daughter lives down the street and already assists with certain tasks, like managing finances. Discussed precautions to take upon initial discharge home. Note that although pt's speech is intelligible at the conversational level, and pt believes it is at her baseline, family thinks that her speech is still a little altered (especialy upon waking from sleep). Discussed that OP SLP could be an option if she wants to work on her speech any further.    SLP Assessment  SLP Recommendation/Assessment: All further Speech Lanaguage Pathology  needs can be addressed in the next venue of care SLP Visit Diagnosis: Dysarthria and anarthria (R47.1)    Recommendations for follow up therapy are one component of a multi-disciplinary discharge planning process, led by the attending physician.  Recommendations may be updated based on patient status, additional functional criteria and insurance authorization.    Follow Up Recommendations  Outpatient SLP (if pt desires for speech)    Assistance Recommended at Discharge  Intermittent Supervision/Assistance  Functional Status Assessment Patient has had a recent decline in their functional status and demonstrates the ability to make significant improvements in function in  a reasonable and predictable amount of time.  Frequency and Duration           SLP Evaluation Cognition  Overall Cognitive Status: History of cognitive impairments - at baseline       Comprehension  Auditory Comprehension Overall Auditory Comprehension: Appears  within functional limits for tasks assessed    Expression Expression Primary Mode of Expression: Verbal Verbal Expression Overall Verbal Expression: Appears within functional limits for tasks assessed   Oral / Motor  Motor Speech Overall Motor Speech: Other (comment) (Family thinks she's not back to baseline but speech is intelligible)             Mahala Menghini., M.A. CCC-SLP Acute Rehabilitation Services Office [phone]  Secure chat preferred  08/04/2023, 12:44 PM

## 2023-08-04 NOTE — ED Notes (Signed)
Echo at [facility]. Son at [facility]. PT alert, NAD, calm, interactive, resps e/u, speaking clearly.

## 2023-08-04 NOTE — ED Notes (Signed)
Back from CT, family at [facility].

## 2023-08-04 NOTE — ED Notes (Signed)
Patient transported to MRI 

## 2023-08-04 NOTE — ED Notes (Signed)
Stroke book given

## 2023-08-04 NOTE — ED Notes (Signed)
Echo out of room. Speech/ SLP into room.

## 2023-08-04 NOTE — Hospital Course (Signed)
JAYNI TRACH is a 82 y.o. female with medical history significant of severe right internal carotid artery stenosis status post right carotid endarterectomy in 2014 and redo right carotid endarterectomy in June 2024, RBBB, CKD stage III, hypertension, hyperlipidemia, anemia, anxiety, arthritis presented to the ED with complaints of slurred speech and left facial droop since 11:30 AM yesterday. Blood pressure 170/69 on arrival to the ED, remainder of vital signs stable.  Labs showing no leukocytosis, hemoglobin 11.2 (stable), sodium 133 (chronically low and stable), glucose 134, BUN 28, creatinine 1.2 (baseline 1.0), ethanol <10. UA with negative nitrite, large amount of leukocytes, and microscopy showing >50 WBCs and rare bacteria. CT head negative for acute intracranial abnormality. Brain MRI showing acute infarct in the right lentiform nucleus/external capsule without evidence of hemorrhage. Patient was given Ativan 1 mg in the ED.  Neurology consulted (Dr. Wilford Corner). [facility] called to admit.

## 2023-08-04 NOTE — Discharge Summary (Addendum)
Physician Discharge Summary   Patient: Amber Barry MRN: [id number] DOB: March 29, 1941  Admit date:     08/03/2023  Discharge date: 08/04/23  Discharge Physician: Steffanie Rainwater   PCP: Debroah Loop, DO   Recommendations at discharge:   Reevaluate her neuro exam to ensure no new neuro deficits Ensure patient was adherent to aspirin and Plavix for additional 20 days after discharge then Plavix alone Ensure patient follows up with neurology in the outpatient  Discharge Diagnoses: Principal Problem:   Acute CVA (cerebrovascular accident) [facility]) Active Problems:   Hypertension   CKD (chronic kidney disease) stage 3, GFR 30-59 ml/min (HCC)   HLD (hyperlipidemia)   Anemia   Hyponatremia  Resolved Problems:   * No resolved hospital problems. *   Hospital Course: Amber Barry is a 82 y.o. female with medical history significant of severe right internal carotid artery stenosis status post right carotid endarterectomy in 2014 and redo right carotid endarterectomy in June 2024, RBBB, CKD stage III, hypertension, hyperlipidemia, anemia, anxiety, arthritis presented to the ED with complaints of slurred speech and left facial droop since 11:30 AM yesterday. Blood pressure 170/69 on arrival to the ED, remainder of vital signs stable.  Labs showing no leukocytosis, hemoglobin 11.2 (stable), sodium 133 (chronically low and stable), glucose 134, BUN 28, creatinine 1.2 (baseline 1.0), ethanol <10. UA with negative nitrite, large amount of leukocytes, and microscopy showing >50 WBCs and rare bacteria. CT head negative for acute intracranial abnormality. Brain MRI showing acute infarct in the right lentiform nucleus/external capsule without evidence of hemorrhage. Patient was given Ativan 1 mg in the ED.  Neurology consulted (Dr. Wilford Corner). [facility] called to admit.   Assessment and Plan: # Acute CVA Patient presenting with complaints of slurred speech and left facial droop. Brain MRI showing acute infarct  in the right lentiform nucleus/external capsule without evidence of hemorrhage. -Neurology following, appreciate recs -Recommends DAPT with ASA 81 mg plus Plavix 75 mg for now, length to be determined later -CTA head and neck negative for LVO -Telemetry monitoring -TTE still pending -Hemoglobin A1c 6.0%, LDL 43 -Frequent neurochecks -PT, OT, speech therapy.   # Asymptomatic bacteriuria UA with negative nitrite, large amount of leukocytes, and microscopy showing >50 WBCs and rare bacteria.  Patient is not endorsing any urinary symptoms.  -Urine culture pending   # CKD stage III Creatinine 1.26 on admission, slightly improved to 1.11. Baseline appears to be around 1.0.  -Continue to hold hydrochlorothiazide and lisinopril at this time. -Avoid nephrotoxic agents   # Hypertension -Remains normotensive   # Hyperlipidemia -Continue Crestor.   # Chronic anemia -Hemoglobin stable.   # Chronic mild hyponatremia -Stable, continue to monitor labs.   # Anxiety -Continue home Lexapro and Xanax PRN.   # GERD -Continue PPI.       Consultants: Neurology Procedures performed: None  Disposition: Home Diet recommendation:  Discharge Diet Orders (From admission, onward)     Start     Ordered   08/04/23 0000  Diet - low sodium heart healthy        08/04/23 1720           Regular diet  DISCHARGE MEDICATION: Allergies as of 08/04/2023       Reactions   Benicar Hct [olmesartan Medoxomil-hctz] Swelling   Facial swelling   Buspar [buspirone] Other (See Comments)   Facial Dysesthesia   Norvasc [amlodipine Besylate] Swelling   legs   Zoloft [sertraline Hcl] Anxiety   Increased anxiety, dysesthesia  Lipitor [atorvastatin] Other (See Comments)   Generalized muscle aches   Lisinopril Swelling   Around eyes   Motrin [ibuprofen] Other (See Comments)    Contraindicated with Celebrex. Pt states MD said to not take together.   Amitriptyline Hcl Anxiety   And Paresthesias    Pravastatin Other (See Comments)   Pt. Wanted to sleep, no energy.        Medication List     TAKE these medications    acetaminophen 500 MG tablet Commonly known as: TYLENOL Take 1,000 mg by mouth daily.   Align 4 MG Caps Take 4 mg by mouth daily.   ALPRAZolam 0.25 MG tablet Commonly known as: XANAX Take 0.125 mg by mouth 2 (two) times daily as needed for anxiety.   aspirin EC 81 MG tablet Take 1 tablet (81 mg total) by mouth daily. Swallow whole. Start taking on: August 05, 2023 What changed:  how much to take additional instructions   azelastine 0.1 % nasal spray Commonly known as: ASTELIN Place 2 sprays into both nostrils 2 (two) times daily as needed for rhinitis or allergies.   CALTRATE 600 PLUS-VIT D PO Take 1 tablet by mouth daily at 12 noon. Soft chews   celecoxib 200 MG capsule Commonly known as: CELEBREX Take 200 mg by mouth 2 (two) times a week.   cholecalciferol 25 MCG (1000 UNIT) tablet Commonly known as: VITAMIN D3 Take 1,000 Units by mouth daily at 12 noon.   clopidogrel 75 MG tablet Commonly known as: PLAVIX Take 1 tablet (75 mg total) by mouth daily. Start taking on: August 05, 2023   escitalopram 20 MG tablet Commonly known as: LEXAPRO Take 20 mg by mouth daily.   hydrochlorothiazide 25 MG tablet Commonly known as: HYDRODIURIL Take 25 mg by mouth daily.   lisinopril 10 MG tablet Commonly known as: ZESTRIL Take 10 mg by mouth daily.   omeprazole 40 MG capsule Commonly known as: PRILOSEC Take 40 mg by mouth daily.   rosuvastatin 5 MG tablet Commonly known as: CRESTOR Take 1 tablet (5 mg total) by mouth daily.        Follow-up Information     [facility]. Schedule an appointment as soon as possible for a visit in 3 week(s).   Specialty: Neurology Contact information: [address] [place] [address] [phone]                Discharge Exam: Ceasar Mons  Weights   08/03/23 1832  Weight: 56.2 kg   General: Pleasant, well-appearing elderly woman laying in bed. No acute distress. HEENT: [place]/AT. Anicteric sclera.  PERRLA.  EOMI.  MMM. CV: RRR. No murmurs, rubs, or gallops. No LE edema Pulmonary: Lungs CTAB. Normal effort. No wheezing or rales. Abdominal: Soft, nontender, nondistended. Normal bowel sounds. Extremities: Palpable radial and DP pulses. Normal ROM. Skin: Warm and dry. No obvious rash or lesions. Neuro: A&Ox3. Speech is clear and comprehensible. Tongue is midline. Normal finger-nose testing. No drift. Mild left facial droop. Strength 5/5 in all extremities. Normal sensation to light touch. No focal deficit. Psych: Normal mood and affect   Condition at discharge: good  The results of significant diagnostics from this hospitalization (including imaging, microbiology, ancillary and laboratory) are listed below for reference.   Imaging Studies: ECHOCARDIOGRAM COMPLETE Result Date: 08/04/2023    ECHOCARDIOGRAM REPORT   Patient Name:   Amber Barry Date of Exam: 08/04/2023 Medical Rec #:  [id number]  Height:       58.0 in Accession #:    [id number]     Weight:       124.0 lb Date of Birth:  12/23/1940      BSA:          1.486 m Patient Age:    82 years       BP:           110/79 mmHg Patient Gender: F              HR:           82 bpm. Exam Location:  Inpatient Procedure: 2D Echo, Cardiac Doppler, Color Doppler and Intracardiac            Opacification Agent Indications:    Stroke  History:        Patient has no prior history of Echocardiogram examinations.                 Risk Factors:Hypertension.  Sonographer:    Karma Ganja Referring Phys: [id number] YNWGNFAOZ RATHORE  Sonographer Comments: Technically challenging study due to limited acoustic windows. Image acquisition challenging due to respiratory motion. IMPRESSIONS  1. Left ventricular ejection fraction, by estimation, is 55 to 60%. The left ventricle has normal function. The left  ventricle has no regional wall motion abnormalities. Left ventricular diastolic parameters are consistent with Grade I diastolic dysfunction (impaired relaxation).  2. Right ventricular systolic function is normal. The right ventricular size is normal. There is mildly elevated pulmonary artery systolic pressure. The estimated right ventricular systolic pressure is 44.5 mmHg.  3. The mitral valve is normal in structure. Trivial mitral valve regurgitation.  4. Color evaluation of TR technically difficult.  5. The aortic valve was not well visualized. Aortic valve regurgitation is not visualized.  6. The inferior vena cava is normal in size with greater than 50% respiratory variability, suggesting right atrial pressure of 3 mmHg. Comparison(s): No prior Echocardiogram. FINDINGS  Left Ventricle: Left ventricular ejection fraction, by estimation, is 55 to 60%. The left ventricle has normal function. The left ventricle has no regional wall motion abnormalities. Definity contrast agent was given IV to delineate the left ventricular  endocardial borders. The left ventricular internal cavity size was normal in size. There is no left ventricular hypertrophy. Left ventricular diastolic parameters are consistent with Grade I diastolic dysfunction (impaired relaxation). Right Ventricle: The right ventricular size is normal. No increase in right ventricular wall thickness. Right ventricular systolic function is normal. There is mildly elevated pulmonary artery systolic pressure. The tricuspid regurgitant velocity is 3.22  m/s, and with an assumed right atrial pressure of 3 mmHg, the estimated right ventricular systolic pressure is 44.5 mmHg. Left Atrium: Left atrial size was normal in size. Right Atrium: Right atrial size was normal in size. Pericardium: There is no evidence of pericardial effusion. Presence of epicardial fat layer. Mitral Valve: The mitral valve is normal in structure. Trivial mitral valve regurgitation.  Tricuspid Valve: Color evaluation of TR technically difficult. The tricuspid valve is not well visualized. Tricuspid valve regurgitation is mild. Aortic Valve: The aortic valve was not well visualized. Aortic valve regurgitation is not visualized. Aortic valve mean gradient measures 7.0 mmHg. Aortic valve peak gradient measures 13.0 mmHg. Aortic valve area, by VTI measures 1.87 cm. Pulmonic Valve: The pulmonic valve was normal in structure. Pulmonic valve regurgitation is not visualized. Aorta: The ascending aorta was not well visualized and the aortic root is normal in size and structure.  Venous: The inferior vena cava is normal in size with greater than 50% respiratory variability, suggesting right atrial pressure of 3 mmHg. IAS/Shunts: No atrial level shunt detected by color flow Doppler.  LEFT VENTRICLE PLAX 2D LVIDd:         4.10 cm     Diastology LVIDs:         3.00 cm     LV e' medial:    5.28 cm/s LV PW:         1.00 cm     LV E/e' medial:  18.2 LV IVS:        1.00 cm     LV e' lateral:   10.40 cm/s LVOT diam:     2.00 cm     LV E/e' lateral: 9.2 LV SV:         65 LV SV Index:   44 LVOT Area:     3.14 cm  LV Volumes (MOD) LV vol d, MOD A4C: 61.8 ml LV vol s, MOD A4C: 28.5 ml LV SV MOD A4C:     61.8 ml RIGHT VENTRICLE             IVC RV Basal diam:  3.60 cm     IVC diam: 1.40 cm RV S prime:     10.40 cm/s TAPSE (M-mode): 2.3 cm LEFT ATRIUM             Index        RIGHT ATRIUM           Index LA diam:        2.90 cm 1.95 cm/m   RA Area:     14.00 cm LA Vol (A2C):   67.0 ml 45.08 ml/m  RA Volume:   32.80 ml  22.07 ml/m LA Vol (A4C):   36.9 ml 24.83 ml/m LA Biplane Vol: 50.1 ml 33.71 ml/m  AORTIC VALVE AV Area (Vmax):    1.73 cm AV Area (Vmean):   1.65 cm AV Area (VTI):     1.87 cm AV Vmax:           180.00 cm/s AV Vmean:          127.000 cm/s AV VTI:            0.347 m AV Peak Grad:      13.0 mmHg AV Mean Grad:      7.0 mmHg LVOT Vmax:         99.20 cm/s LVOT Vmean:        66.800 cm/s LVOT VTI:           0.207 m LVOT/AV VTI ratio: 0.60  AORTA Ao Root diam: 3.20 cm MITRAL VALVE                TRICUSPID VALVE MV Area (PHT): 4.06 cm     TR Peak grad:   41.5 mmHg MV Decel Time: 187 msec     TR Vmax:        322.00 cm/s MV E velocity: 96.00 cm/s MV A velocity: 129.00 cm/s  SHUNTS MV E/A ratio:  0.74         Systemic VTI:  0.21 m                             Systemic Diam: 2.00 cm Riley Lam MD Electronically signed by Riley Lam MD Signature Date/Time: 08/04/2023/11:51:13 AM    Final    CT ANGIO HEAD  NECK W WO CM Result Date: 08/04/2023 CLINICAL DATA:  82 year old female with neurologic deficit, right basal ganglia lacunar infarct on MRI 0100 hours today. EXAM: CT ANGIOGRAPHY HEAD AND NECK WITH AND WITHOUT CONTRAST TECHNIQUE: Multidetector CT imaging of the head and neck was performed using the standard protocol during bolus administration of intravenous contrast. Multiplanar CT image reconstructions and MIPs were obtained to evaluate the vascular anatomy. Carotid stenosis measurements (when applicable) are obtained utilizing NASCET criteria, using the distal internal carotid diameter as the denominator. RADIATION DOSE REDUCTION: This exam was performed according to the departmental dose-optimization program which includes automated exposure control, adjustment of the mA and/or kV according to patient size and/or use of iterative reconstruction technique. CONTRAST:  75mL OMNIPAQUE IOHEXOL 350 MG/ML SOLN COMPARISON:  Brain MRI 0100 hours today. Head CT yesterday. Previous CTA head and neck 08/28/2022. FINDINGS: CT HEAD Brain: Subtle hypodensity in the right basal ganglia corresponding to DWI positive lacunar infarct on MRI (series 6, image 16). No associated hemorrhage or mass effect. Elsewhere Stable non contrast CT appearance of the brain. Calvarium and skull base: No acute osseous abnormality identified. Paranasal sinuses: Visualized paranasal sinuses and mastoids are stable and well aerated.  Orbits: No acute orbit or scalp soft tissue finding. CTA NECK Skeleton: Bulky cervical spine degeneration, including developing degenerative facet ankylosis bilaterally at C3-C4. Advanced sternoclavicular degeneration. No acute osseous abnormality identified. Upper chest: Upper lung subpleural scarring is stable. Negative visible superior mediastinum. Other neck: No acute finding. Aortic arch: 3 vessel arch with no significant arch atherosclerosis visible. Right carotid system: Previous right carotid endarterectomy with capacious proximal right ICA. No significant stenosis to the skull base. Left carotid system: Negative left CCA and carotid bifurcation. Mild to moderate left ICA bulb calcified plaque with less than 50 % stenosis with respect to the distal vessel. Vertebral arteries: Mild right subclavian origin plaque without stenosis. Normal right vertebral artery origin. Right vertebral artery is mildly dominant, patent to the skull base with no significant plaque or stenosis. Mild proximal left subclavian plaque without stenosis. Normal left vertebral artery origin. Tortuous left V1 segment. Non dominant although fairly normal size left vertebral artery is patent to the skull base with no significant plaque or stenosis. CTA HEAD Posterior circulation: Left vertebral artery mild to moderate atherosclerosis and stenosis as it crosses the dura is stable since January on series 12, image 139. Non dominant left V4 segment than is normal to the vertebrobasilar junction. Normal left PICA origin. Minimal distal right vertebral artery plaque without stenosis. Dominant right V4 segment. Normal right PICA origin. Normal vertebrobasilar junction. Patent basilar artery without stenosis. Patent SCA and PCA origins. Posterior communicating arteries are diminutive or absent. Bilateral PCA branches are within normal limits. Anterior circulation: Both ICA siphons are patent. Left siphon moderate mostly cavernous segment calcified  plaque, only mild left siphon stenosis. Similar right siphon moderate calcified plaque at the distal cavernous segment, anterior genu. Mild to moderate stenosis there. Patent carotid termini, MCA and ACA origins. Diminutive or absent anterior communicating artery. Bilateral ACA branches are stable and within normal limits. Left MCA M1 segment and trifurcation are patent without stenosis. Left MCA branches are stable and within normal limits. Right MCA M1 segment and trifurcation are patent without stenosis. Right MCA branches are stable and within normal limits. Venous sinuses: Patent. Anatomic variants: Dominant right vertebral artery. Review of the MIP images confirms the above findings IMPRESSION: 1. Negative for large vessel occlusion and stable CTA since January: - Previous right  carotid endarterectomy with no adverse features. - bilateral ICA siphon and left ICA bulb calcified atherosclerosis with mild to moderate right siphon stenosis. - dominant right vertebral artery with minimal atherosclerosis. Stable mild to moderate distal left vertebral artery atherosclerotic stenosis at the skull base. 2. Expected CT appearance of right basal ganglia lacunar infarct with no hemorrhagic transformation or mass effect. No new intracranial abnormality. Electronically Signed   By: Odessa Fleming M.D.   On: 08/04/2023 08:01   MR BRAIN WO CONTRAST Result Date: 08/04/2023 CLINICAL DATA:  Stroke suspected, facial paralysis and weakness EXAM: MRI HEAD WITHOUT CONTRAST TECHNIQUE: Multiplanar, multiecho pulse sequences of the brain and surrounding structures were obtained without intravenous contrast. COMPARISON:  08/29/2022 MRI head, correlation is also made with 08/03/2023 CT head FINDINGS: Brain: Restricted diffusion with ADC correlate in the right lentiform nucleus/external capsule (series 2, images 25-31), compatible with acute infarct. The series associated with increased T2 hyperintense signal. No acute hemorrhage, mass, mass  effect, or midline shift. No hydrocephalus or extra-axial collection. No hemosiderin deposition to suggest remote hemorrhage. Scattered T2 hyperintense signal in the periventricular white matter and pons, likely the sequela of mild chronic small vessel ischemic disease. Vascular: Normal arterial flow voids. Skull and upper cervical spine: Normal marrow signal. Sinuses/Orbits: Clear paranasal sinuses. No acute finding in the orbits. Other: The mastoid air cells are well aerated. IMPRESSION: Acute infarct in the right lentiform nucleus/external capsule. No evidence of hemorrhage. These results will be called to the ordering clinician or representative by the Radiologist Assistant, and communication documented in the PACS or [employer]. Electronically Signed   By: Wiliam Ke M.D.   On: 08/04/2023 02:28   CT Head Wo Contrast Result Date: 08/03/2023 CLINICAL DATA:  Facial paralysis and weakness. EXAM: CT HEAD WITHOUT CONTRAST TECHNIQUE: Contiguous axial images were obtained from the base of the skull through the vertex without intravenous contrast. RADIATION DOSE REDUCTION: This exam was performed according to the departmental dose-optimization program which includes automated exposure control, adjustment of the mA and/or kV according to patient size and/or use of iterative reconstruction technique. COMPARISON:  MRI brain 08/29/2022.  CT head 08/28/2022 FINDINGS: Brain: No evidence of acute infarction, hemorrhage, hydrocephalus, extra-axial collection or mass lesion/mass effect. Vascular: No hyperdense vessel or unexpected calcification. Skull: Normal. Negative for fracture or focal lesion. Sinuses/Orbits: Paranasal sinuses and mastoid air cells are clear. Other: None. IMPRESSION: No acute intracranial abnormalities. Electronically Signed   By: Burman Nieves M.D.   On: 08/03/2023 20:36    Microbiology: Results for orders placed or performed during the hospital encounter of 01/06/23  Surgical pcr  screen     Status: None   Collection Time: 01/06/23  2:30 PM   Specimen: Nasal Mucosa; Nasal Swab  Result Value Ref Range Status   MRSA, PCR NEGATIVE NEGATIVE Final   Staphylococcus aureus NEGATIVE NEGATIVE Final    Comment: (NOTE) The Xpert SA Assay (FDA approved for NASAL specimens in patients 32 years of age and older), is one component of a comprehensive surveillance program. It is not intended to diagnose infection nor to guide or monitor treatment. Performed at [facility], [address], [place], [place] [address]     Labs: CBC: Recent Labs  Lab 08/03/23 1840 08/03/23 1905  WBC 6.5  --   NEUTROABS 3.9  --   HGB 11.2* 11.6*  HCT 34.3* 34.0*  MCV 92.0  --   PLT 230  --    Basic Metabolic Panel: Recent Labs  Lab 08/03/23 1840 08/03/23 1905 08/04/23 0444  NA 133* 137 134*  K 4.5 4.4 3.9  CL 102 103 102  CO2 23  --  21*  GLUCOSE 134* 133* 115*  BUN 28* 30* 23  CREATININE 1.26* 1.40* 1.11*  CALCIUM 9.7  --  9.5   Liver Function Tests: Recent Labs  Lab 08/03/23 1840  AST 21  ALT 13  ALKPHOS 59  BILITOT 0.2  PROT 7.3  ALBUMIN 3.8   CBG: Recent Labs  Lab 08/03/23 2106  GLUCAP 101*    Discharge time spent: less than 30 minutes.  Signed: Steffanie Rainwater, MD [facility] 08/04/2023

## 2023-08-04 NOTE — Discharge Instructions (Addendum)
Amber Barry,  It was a pleasure taking care of you at [facility]. You were admitted for a stroke and started on a new medication to help decrease your risk of future strokes. You do not have any therapy needs at this point. We are discharging you home now that you are doing better. Please follow the following instructions.  1) Take Aspirin 81 mg daily for 20 more days, THEN STOP 2) Continue taking Plavix 75 mg daily and Crestor 5 mg indefinitely 3) Follow with your primary care doctor in 1 week 4) Schedule a follow up with the neurology clinic in 3 weeks  Take care,  Dr. Sharrell Ku, MD, MPH

## 2023-08-04 NOTE — Care Management Obs Status (Signed)
MEDICARE OBSERVATION STATUS NOTIFICATION   Patient Details  Name: Amber Barry MRN: [id number] Date of Birth: [birth year]   Medicare Observation Status Notification Given:  Yes    Gordy Clement, RN 08/04/2023, 4:07 PM

## 2023-08-04 NOTE — ED Notes (Signed)
Up to b/r, steady gait, recent sedating med for MRI, with family at side

## 2023-08-04 NOTE — H&P (Signed)
History and Physical    Amber Barry XLK:[id number] DOB: 03-09-1941 DOA: 08/03/2023  PCP: Debroah Loop, DO  Patient coming from: Home  Chief Complaint: Slurred speech  HPI: Amber Barry is a 82 y.o. female with medical history significant of severe right internal carotid artery stenosis status post right carotid endarterectomy in 2014 and redo right carotid endarterectomy in June 2024, RBBB, CKD stage III, hypertension, hyperlipidemia, anemia, anxiety, arthritis presented to the ED with complaints of slurred speech and left facial droop since 11:30 AM yesterday.  Blood pressure 170/69 on arrival to the ED, remainder of vital signs stable.  Labs showing no leukocytosis, hemoglobin 11.2 (stable), sodium 133 (chronically low and stable), glucose 134, BUN 28, creatinine 1.2 (baseline 1.0), ethanol <10.  UA with negative nitrite, large amount of leukocytes, and microscopy showing >50 WBCs and rare bacteria.  CT head negative for acute intracranial abnormality.  Brain MRI showing acute infarct in the right lentiform nucleus/external capsule without evidence of hemorrhage. Patient was given Ativan 1 mg in the ED.  Neurology consulted (Dr. Wilford Corner).  [facility] called to admit.  History provided by the patient and her family at bedside.  Yesterday around 11:30 AM she started having slurred speech and left-sided facial droop.  Symptoms resolved initially but then started again around 2 PM in the afternoon and since then have been waxing and waning.  No history of previous stroke.  No weakness of arm or leg.  She takes 3 baby aspirins (243 mg) daily.  Patient has no other complaints.  Denies fevers, cough, shortness breath, chest pain, nausea, vomiting, abdominal pain, diarrhea, dysuria, or urinary frequency/urgency.  Review of Systems:  Review of Systems  All other systems reviewed and are negative.   Past Medical History:  Diagnosis Date   Anemia    pt denies   Anxiety    Arthritis    Osteoarthritis    Bursitis    left hip-had injection 2015   Carotid artery occlusion 11/2012   Left Bruit, 80% blockage   CKD (chronic kidney disease)    Stage III   Diverticulitis    Hematuria    Hypertension    Interstitial cystitis    Lower back pain 01/2014   Ocular migraine    Osteopenia    PONV (postoperative nausea and vomiting)     Past Surgical History:  Procedure Laterality Date   ENDARTERECTOMY Right 12/22/2012   Procedure: Right Carotid Endarterectomy with hemashield patch angioplasty;  Surgeon: Pryor Ochoa, MD;  Location[facility] OR;  Service: Vascular;  Laterality: Right;   ENDARTERECTOMY Right 01/13/2023   Procedure: RIGHT ENDARTERECTOMY COMMON CAROTID;  Surgeon: Maeola Harman, MD;  Location: [facility] OR;  Service: Vascular;  Laterality: Right;   PATCH ANGIOPLASTY Right 01/13/2023   Procedure: PATCH ANGIOPLASTY USING HEMASHIELD PLATINUM PATCH 0.8CM X 7.6CM;  Surgeon: Maeola Harman, MD;  Location: [facility] OR;  Service: Vascular;  Laterality: Right;   TONSILLECTOMY  1947     reports that she has never smoked. She has never been exposed to tobacco smoke. She has never used smokeless tobacco. She reports that she does not drink alcohol and does not use drugs.  Allergies  Allergen Reactions   Benicar Hct [Olmesartan Medoxomil-Hctz] Swelling    Facial swelling   Buspar [Buspirone] Other (See Comments)    Facial Dysesthesia   Norvasc [Amlodipine Besylate] Swelling    legs   Zoloft [Sertraline Hcl] Anxiety    Increased anxiety, dysesthesia   Lipitor [Atorvastatin] Other (See  Comments)    Generalized muscle aches   Lisinopril Swelling    Around eyes   Motrin [Ibuprofen] Other (See Comments)     Contraindicated with Celebrex. Pt states MD said to not take together.   Amitriptyline Hcl Anxiety    And Paresthesias   Pravastatin Other (See Comments)    Pt. Wanted to sleep, no energy.    Family History  Problem Relation Age of Onset   COPD Mother        Respiratory Disease    Rheumatic fever Mother    Pulmonary disease Mother    Heart attack Mother    Stroke Father    Hypertension Father    Diabetes Paternal Aunt    Prostate cancer Maternal Grandfather    Hypertension Paternal Aunt    Hypertension Paternal Grandmother    Other Maternal Grandmother        hardening arteries   Stroke Other        paternal side    Prior to Admission medications   Medication Sig Start Date End Date Taking? Authorizing Provider  acetaminophen (TYLENOL) 500 MG tablet Take 1,000 mg by mouth daily.    [provider]  ALPRAZolam Prudy Feeler) 0.25 MG tablet Take 0.125 mg by mouth 2 (two) times daily as needed for anxiety.     [provider]  aspirin EC 81 MG tablet Take 243 mg by mouth daily.    [provider]  azelastine (ASTELIN) 0.1 % nasal spray Place 2 sprays into both nostrils 2 (two) times daily as needed for rhinitis or allergies. 11/15/14   [provider]  benzonatate (TESSALON) 200 MG capsule Take 200 mg by mouth 3 (three) times daily as needed for cough.    [provider]  Calcium-Vitamin D (CALTRATE 600 PLUS-VIT D PO) Take 1 tablet by mouth daily at 12 noon. Soft chews    [provider]  celecoxib (CELEBREX) 200 MG capsule Take 200 mg by mouth 2 (two) times a week.    [provider]  cholecalciferol (VITAMIN D3) 25 MCG (1000 UNIT) tablet Take 1,000 Units by mouth daily at 12 noon.    [provider]  escitalopram (LEXAPRO) 20 MG tablet Take 20 mg by mouth daily.    [provider]  hydrochlorothiazide (HYDRODIURIL) 25 MG tablet Take 25 mg by mouth daily.  11/15/14   [provider]  lisinopril (ZESTRIL) 10 MG tablet Take 10 mg by mouth daily.    [provider]  oxyCODONE-acetaminophen (PERCOCET/ROXICET) 5-325 MG tablet Take 1 tablet by mouth every 6 (six) hours as needed for moderate pain. 01/14/23   Baglia, Corrina, PA-C  Probiotic Product (ALIGN) 4 MG CAPS Take 4 mg by mouth  daily.    [provider]  Pseudoephedrine HCl (SUDAFED PO) Take 30 mg by mouth daily as needed (allergies).    [provider]  rosuvastatin (CRESTOR) 5 MG tablet Take 1 tablet (5 mg total) by mouth daily. 06/17/23 06/11/24  Chrystie Nose, MD    Physical Exam: Vitals:   08/03/23 2100 08/03/23 2330 08/03/23 2346 08/04/23 0316  BP: (!) 161/74 128/69  127/82  Pulse: 72 91  84  Resp: 18 17  (!) 24  Temp:   98.6 F (37 C) 98 F (36.7 C)  TempSrc:   Axillary Oral  SpO2: 100% 97%  96%  Weight:      Height:        Physical Exam Vitals reviewed.  Constitutional:  General: She is not in acute distress. HENT:     Head: Normocephalic and atraumatic.  Eyes:     Extraocular Movements: Extraocular movements intact.  Cardiovascular:     Rate and Rhythm: Normal rate and regular rhythm.     Pulses: Normal pulses.  Pulmonary:     Effort: Pulmonary effort is normal. No respiratory distress.     Breath sounds: Normal breath sounds. No wheezing or rales.  Abdominal:     General: Bowel sounds are normal. There is no distension.     Palpations: Abdomen is soft.     Tenderness: There is no abdominal tenderness. There is no guarding.  Musculoskeletal:     Cervical back: Normal range of motion.     Right lower leg: No edema.     Left lower leg: No edema.  Skin:    General: Skin is warm and dry.  Neurological:     Mental Status: She is alert and oriented to person, place, and time.     Sensory: No sensory deficit.     Motor: No weakness.     Comments: Speech fluent Strength 5 out of 5 in bilateral upper and lower extremities.     Labs on Admission: I have personally reviewed following labs and imaging studies  CBC: Recent Labs  Lab 08/03/23 1840 08/03/23 1905  WBC 6.5  --   NEUTROABS 3.9  --   HGB 11.2* 11.6*  HCT 34.3* 34.0*  MCV 92.0  --   PLT 230  --    Basic Metabolic Panel: Recent Labs  Lab 08/03/23 1840 08/03/23 1905  NA 133* 137  K 4.5 4.4   CL 102 103  CO2 23  --   GLUCOSE 134* 133*  BUN 28* 30*  CREATININE 1.26* 1.40*  CALCIUM 9.7  --    GFR: Estimated Creatinine Clearance: 22.6 mL/min (A) (by C-G formula based on SCr of 1.4 mg/dL (H)). Liver Function Tests: Recent Labs  Lab 08/03/23 1840  AST 21  ALT 13  ALKPHOS 59  BILITOT 0.2  PROT 7.3  ALBUMIN 3.8   No results for input(s): "LIPASE", "AMYLASE" in the last 168 hours. No results for input(s): "AMMONIA" in the last 168 hours. Coagulation Profile: Recent Labs  Lab 08/03/23 1840  INR 1.1   Cardiac Enzymes: No results for input(s): "CKTOTAL", "CKMB", "CKMBINDEX", "TROPONINI" in the last 168 hours. BNP (last 3 results) No results for input(s): "PROBNP" in the last 8760 hours. HbA1C: No results for input(s): "HGBA1C" in the last 72 hours. CBG: Recent Labs  Lab 08/03/23 2106  GLUCAP 101*   Lipid Profile: No results for input(s): "CHOL", "HDL", "LDLCALC", "TRIG", "CHOLHDL", "LDLDIRECT" in the last 72 hours. Thyroid Function Tests: No results for input(s): "TSH", "T4TOTAL", "FREET4", "T3FREE", "THYROIDAB" in the last 72 hours. Anemia Panel: No results for input(s): "VITAMINB12", "FOLATE", "FERRITIN", "TIBC", "IRON", "RETICCTPCT" in the last 72 hours. Urine analysis:    Component Value Date/Time   COLORURINE STRAW (A) 08/03/2023 2353   APPEARANCEUR HAZY (A) 08/03/2023 2353   LABSPEC 1.010 08/03/2023 2353   PHURINE 5.0 08/03/2023 2353   GLUCOSEU NEGATIVE 08/03/2023 2353   HGBUR NEGATIVE 08/03/2023 2353   BILIRUBINUR NEGATIVE 08/03/2023 2353   KETONESUR NEGATIVE 08/03/2023 2353   PROTEINUR NEGATIVE 08/03/2023 2353   UROBILINOGEN 0.2 12/10/2012 0939   NITRITE NEGATIVE 08/03/2023 2353   LEUKOCYTESUR LARGE (A) 08/03/2023 2353    Radiological Exams on Admission: MR BRAIN WO CONTRAST Result Date: 08/04/2023 CLINICAL DATA:  Stroke suspected, facial  paralysis and weakness EXAM: MRI HEAD WITHOUT CONTRAST TECHNIQUE: Multiplanar, multiecho pulse  sequences of the brain and surrounding structures were obtained without intravenous contrast. COMPARISON:  08/29/2022 MRI head, correlation is also made with 08/03/2023 CT head FINDINGS: Brain: Restricted diffusion with ADC correlate in the right lentiform nucleus/external capsule (series 2, images 25-31), compatible with acute infarct. The series associated with increased T2 hyperintense signal. No acute hemorrhage, mass, mass effect, or midline shift. No hydrocephalus or extra-axial collection. No hemosiderin deposition to suggest remote hemorrhage. Scattered T2 hyperintense signal in the periventricular white matter and pons, likely the sequela of mild chronic small vessel ischemic disease. Vascular: Normal arterial flow voids. Skull and upper cervical spine: Normal marrow signal. Sinuses/Orbits: Clear paranasal sinuses. No acute finding in the orbits. Other: The mastoid air cells are well aerated. IMPRESSION: Acute infarct in the right lentiform nucleus/external capsule. No evidence of hemorrhage. These results will be called to the ordering clinician or representative by the Radiologist Assistant, and communication documented in the PACS or [employer]. Electronically Signed   By: Wiliam Ke M.D.   On: 08/04/2023 02:28   CT Head Wo Contrast Result Date: 08/03/2023 CLINICAL DATA:  Facial paralysis and weakness. EXAM: CT HEAD WITHOUT CONTRAST TECHNIQUE: Contiguous axial images were obtained from the base of the skull through the vertex without intravenous contrast. RADIATION DOSE REDUCTION: This exam was performed according to the departmental dose-optimization program which includes automated exposure control, adjustment of the mA and/or kV according to patient size and/or use of iterative reconstruction technique. COMPARISON:  MRI brain 08/29/2022.  CT head 08/28/2022 FINDINGS: Brain: No evidence of acute infarction, hemorrhage, hydrocephalus, extra-axial collection or mass lesion/mass effect.  Vascular: No hyperdense vessel or unexpected calcification. Skull: Normal. Negative for fracture or focal lesion. Sinuses/Orbits: Paranasal sinuses and mastoid air cells are clear. Other: None. IMPRESSION: No acute intracranial abnormalities. Electronically Signed   By: Burman Nieves M.D.   On: 08/03/2023 20:36    EKG: Independently reviewed.  Sinus rhythm with short PR interval, RBBB is not new.  Assessment and Plan  Acute CVA Patient presenting with complaints of slurred speech and left facial droop.  Symptoms waxing and waning since 11:30 AM yesterday.  Brain MRI showing acute infarct in the right lentiform nucleus/external capsule without evidence of hemorrhage. -Neurology consulted.  Antiplatelet therapy per neurology recommendations. -History of severe right internal carotid artery stenosis status post right carotid endarterectomy in 2014 and redo right carotid endarterectomy in June 2024.  Will defer decision regarding carotid imaging to neurology. -Telemetry monitoring -Echocardiogram -Hemoglobin A1c, fasting lipid panel -Frequent neurochecks -PT, OT, speech therapy. -N.p.o. until cleared by bedside swallow evaluation or formal speech evaluation  ?UTI UA with negative nitrite, large amount of leukocytes, and microscopy showing >50 WBCs and rare bacteria.  Patient is not endorsing any urinary symptoms.  No fever or leukocytosis.  Urine culture ordered.  CKD stage III Creatinine 1.2 and baseline appears to be around 1.0.  Continue to monitor renal function.  Hold hydrochlorothiazide and lisinopril at this time.  Hypertension Currently normotensive.  Hold antihypertensives to allow permissive hypertension at this time.  Hyperlipidemia Continue Crestor.  Chronic anemia Hemoglobin stable.  Chronic mild hyponatremia Stable, continue to monitor labs.  Anxiety Continue home Lexapro and Xanax PRN.  GERD Continue PPI.  DVT prophylaxis: Lovenox Code Status: Full Code  (discussed with the patient) Family Communication: Patient's son and daughter at bedside. Level of care: Telemetry bed Admission status: It is my clinical opinion that referral  for OBSERVATION is reasonable and necessary in this patient based on the above information provided. The aforementioned taken together are felt to place the patient at high risk for further clinical deterioration. However, it is anticipated that the patient may be medically stable for discharge from the hospital within 24 to 48 hours.  John Giovanni MD [facility]  If 7PM-7AM, please contact night-coverage [email or web address]  08/04/2023, 3:43 AM

## 2023-08-04 NOTE — Consult Note (Addendum)
NEUROLOGY CONSULT NOTE   Date of service: August 04, 2023 Patient Name: Amber Barry MRN:  [id number] DOB:  16-Nov-1940 Chief Complaint: "Left facial weakness, left-sided weakness, slurred speech" Requesting Provider: John Giovanni, MD  History of Present Illness  Amber Barry is a 82 y.o. female  has a past medical history of Anemia, Anxiety, Arthritis, Bursitis, Carotid artery occlusion (11/2012), CKD (chronic kidney disease), Diverticulitis, Hematuria, Hypertension, Interstitial cystitis, Lower back pain (01/2014), Ocular migraine, Osteopenia, and PONV (postoperative nausea and vomiting).   Prior history of right carotid endarterectomy twice with most recent 1 in June 2024-presented to the ED for evaluation of recurrent episodes of slurred speech and left-sided facial drooping since this afternoon along with slow and muffled speech as well. She was in her usual state of health around noon today, when after that it was noticed that she has been having slurred speech and left facial droop when she was trying to read a Christmas card.  There was no confusion described by the family but the report that she has been having insomnia and some abnormal movements in her body when she suddenly falls asleep while sitting in chair or bed for the last few days. She was taken to an urgent care this afternoon/early evening with complaints of fatigue and left leg weakness L as well as slurred speech.  EKG done at urgent care was concerning for new findings and was transferred to the ER for evaluation of slurred speech as well as EKG changes.  EKG changes were deemed to be chronic-see EDP report.  MRI of the brain was performed and it showed right external capsule/lentiform nucleus acute infarct.  Neurology was consulted   LKW: Noon 08/03/2023 Modified rankin score: 0-Completely asymptomatic and back to baseline post- stroke IV Thrombolysis: Outside the window EVT: No-symptoms are not consistent with  LVO  NIHSS components Score: Comment  1a Level of Conscious 0[x]  1[]  2[]  3[]      1b LOC Questions 0[x]  1[]  2[]       1c LOC Commands 0[x]  1[]  2[]       2 Best Gaze 0[x]  1[]  2[]       3 Visual 0[x]  1[]  2[]  3[]      4 Facial Palsy 0[x]  1[]  2[]  3[]      5a Motor Arm - left 0[x]  1[]  2[]  3[]  4[]  UN[]    5b Motor Arm - Right 0[x]  1[]  2[]  3[]  4[]  UN[]    6a Motor Leg - Left 0[x]  1[]  2[]  3[]  4[]  UN[]    6b Motor Leg - Right 0[x]  1[]  2[]  3[]  4[]  UN[]    7 Limb Ataxia 0[x]  1[]  2[]  3[]  UN[]     8 Sensory 0[x]  1[]  2[]  UN[]      9 Best Language 0[x]  1[]  2[]  3[]      10 Dysarthria 0[]  1[x]  2[]  UN[]      11 Extinct. and Inattention 0[x]  1[]  2[]       TOTAL: 1      ROS  Comprehensive ROS performed and pertinent positives documented in HPI    Past History   Past Medical History:  Diagnosis Date   Anemia    pt denies   Anxiety    Arthritis    Osteoarthritis   Bursitis    left hip-had injection 2015   Carotid artery occlusion 11/2012   Left Bruit, 80% blockage   CKD (chronic kidney disease)    Stage III   Diverticulitis    Hematuria    Hypertension    Interstitial cystitis    Lower back pain  01/2014   Ocular migraine    Osteopenia    PONV (postoperative nausea and vomiting)     Past Surgical History:  Procedure Laterality Date   ENDARTERECTOMY Right 12/22/2012   Procedure: Right Carotid Endarterectomy with hemashield patch angioplasty;  Surgeon: Pryor Ochoa, MD;  Location: [facility] OR;  Service: Vascular;  Laterality: Right;   ENDARTERECTOMY Right 01/13/2023   Procedure: RIGHT ENDARTERECTOMY COMMON CAROTID;  Surgeon: Maeola Harman, MD;  Location: [facility] OR;  Service: Vascular;  Laterality: Right;   PATCH ANGIOPLASTY Right 01/13/2023   Procedure: PATCH ANGIOPLASTY USING HEMASHIELD PLATINUM PATCH 0.8CM X 7.6CM;  Surgeon: Maeola Harman, MD;  Location: [facility] OR;  Service: Vascular;  Laterality: Right;   TONSILLECTOMY  1947    Family History: Family History  Problem Relation Age of  Onset   COPD Mother        Respiratory Disease   Rheumatic fever Mother    Pulmonary disease Mother    Heart attack Mother    Stroke Father    Hypertension Father    Diabetes Paternal Aunt    Prostate cancer Maternal Grandfather    Hypertension Paternal Aunt    Hypertension Paternal Grandmother    Other Maternal Grandmother        hardening arteries   Stroke Other        paternal side    Social History  reports that she has never smoked. She has never been exposed to tobacco smoke. She has never used smokeless tobacco. She reports that she does not drink alcohol and does not use drugs.  Allergies  Allergen Reactions   Benicar Hct [Olmesartan Medoxomil-Hctz] Swelling    Facial swelling   Buspar [Buspirone] Other (See Comments)    Facial Dysesthesia   Norvasc [Amlodipine Besylate] Swelling    legs   Zoloft [Sertraline Hcl] Anxiety    Increased anxiety, dysesthesia   Lipitor [Atorvastatin] Other (See Comments)    Generalized muscle aches   Lisinopril Swelling    Around eyes   Motrin [Ibuprofen] Other (See Comments)     Contraindicated with Celebrex. Pt states MD said to not take together.   Amitriptyline Hcl Anxiety    And Paresthesias   Pravastatin Other (See Comments)    Pt. Wanted to sleep, no energy.    Medications   Current Facility-Administered Medications:    sodium chloride flush (NS) 0.9 % injection 3 mL, 3 mL, Intravenous, Once, Alvira Monday, MD  Current Outpatient Medications:    acetaminophen (TYLENOL) 500 MG tablet, Take 1,000 mg by mouth daily., Disp: , Rfl:    ALPRAZolam (XANAX) 0.25 MG tablet, Take 0.125 mg by mouth 2 (two) times daily as needed for anxiety. , Disp: , Rfl:    aspirin EC 81 MG tablet, Take 243 mg by mouth daily., Disp: , Rfl:    azelastine (ASTELIN) 0.1 % nasal spray, Place 2 sprays into both nostrils 2 (two) times daily as needed for rhinitis or allergies., Disp: , Rfl: 12   benzonatate (TESSALON) 200 MG capsule, Take 200 mg by  mouth 3 (three) times daily as needed for cough., Disp: , Rfl:    Calcium-Vitamin D (CALTRATE 600 PLUS-VIT D PO), Take 1 tablet by mouth daily at 12 noon. Soft chews, Disp: , Rfl:    celecoxib (CELEBREX) 200 MG capsule, Take 200 mg by mouth 2 (two) times a week., Disp: , Rfl:    cholecalciferol (VITAMIN D3) 25 MCG (1000 UNIT) tablet, Take 1,000 Units by mouth daily at 12  noon., Disp: , Rfl:    escitalopram (LEXAPRO) 20 MG tablet, Take 20 mg by mouth daily., Disp: , Rfl:    hydrochlorothiazide (HYDRODIURIL) 25 MG tablet, Take 25 mg by mouth daily. , Disp: , Rfl: 3   lisinopril (ZESTRIL) 10 MG tablet, Take 10 mg by mouth daily., Disp: , Rfl:    oxyCODONE-acetaminophen (PERCOCET/ROXICET) 5-325 MG tablet, Take 1 tablet by mouth every 6 (six) hours as needed for moderate pain., Disp: 12 tablet, Rfl: 0   Probiotic Product (ALIGN) 4 MG CAPS, Take 4 mg by mouth daily., Disp: , Rfl:    Pseudoephedrine HCl (SUDAFED PO), Take 30 mg by mouth daily as needed (allergies)., Disp: , Rfl:    rosuvastatin (CRESTOR) 5 MG tablet, Take 1 tablet (5 mg total) by mouth daily., Disp: 90 tablet, Rfl: 3  Vitals   Vitals:   08-31-2023 2100 2023-08-31 2330 08-31-23 2346 08/04/23 0316  BP: (!) 161/74 128/69  127/82  Pulse: 72 91  84  Resp: 18 17  (!) 24  Temp:   98.6 F (37 C) 98 F (36.7 C)  TempSrc:   Axillary Oral  SpO2: 100% 97%  96%  Weight:      Height:        Body mass index is 25.92 kg/m.  Physical Exam  General: Well-developed well-nourished in no acute distress HEENT: Normocephalic atraumatic Lungs: Clear Cardiovascular: Regular rhythm Neurological exam Comfortably sleeping in bed.  Opens eyes to voice.  Participates with a full exam.  Able to provide reasonable history but family at bedside says that she is very tired from not being able to sleep while waiting in the waiting room and has had episodes of slurred speech and facial droop multiple times while waiting in the waiting room and once or twice in  the room itself. At the time of my evaluation, had mild dysarthria. No evidence of aphasia Cranial nerves II to XII grossly intact on my examination Motor examination with no drift Sensation intact to light touch No gross dysmetria  Labs/Imaging/Neurodiagnostic studies   CBC:  Recent Labs  Lab August 31, 2023 1840 08/31/23 1905  WBC 6.5  --   NEUTROABS 3.9  --   HGB 11.2* 11.6*  HCT 34.3* 34.0*  MCV 92.0  --   PLT 230  --    Basic Metabolic Panel:  Lab Results  Component Value Date   NA 137 Aug 31, 2023   K 4.4 08/31/23   CO2 23 31-Aug-2023   GLUCOSE 133 (H) August 31, 2023   BUN 30 (H) 31-Aug-2023   CREATININE 1.40 (H) 31-Aug-2023   CALCIUM 9.7 August 31, 2023   GFRNONAA 42 (L) 08/31/23   GFRAA 56 (L) 02/13/2017   Lipid Panel:  Lab Results  Component Value Date   LDLCALC 28 01/14/2023   HgbA1c: No results found for: "HGBA1C" Urine Drug Screen:     Component Value Date/Time   LABOPIA NONE DETECTED 08/28/2022 2238   COCAINSCRNUR NONE DETECTED 08/28/2022 2238   LABBENZ NONE DETECTED 08/28/2022 2238   AMPHETMU NONE DETECTED 08/28/2022 2238   THCU NONE DETECTED 08/28/2022 2238   LABBARB NONE DETECTED 08/28/2022 2238    Alcohol Level     Component Value Date/Time   ETH <10 Aug 31, 2023 1859   INR  Lab Results  Component Value Date   INR 1.1 Aug 31, 2023   APTT  Lab Results  Component Value Date   APTT 31 August 31, 2023    MRI head without contrast-right external capsule/lentiform nucleus acute infarction.  ASSESSMENT   Fernande Boyden  is a 82 y.o. female  has a past medical history of Anemia, Anxiety, Arthritis, Bursitis, Carotid artery occlusion (11/2012), CKD (chronic kidney disease), Diverticulitis, Hematuria, Hypertension, Interstitial cystitis, Lower back pain (01/2014), Ocular migraine, Osteopenia, and PONV (postoperative nausea and vomiting).  Right CEA x 2 with the most recent 1 in June 2024 presented for evaluation of intermittent episodes of slurred speech left-sided  facial drooping left-sided weakness since this afternoon. MRI brain reveals a lacunar appearing infarct in the right external capsule/lentiform nucleus. Due to her history of right carotid disease, although the location and appearance of the stroke appears that from small vessel etiology, large vessel source should be evaluated.  Impression: Acute ischemic stroke-etiology under investigation  RECOMMENDATIONS  Admit to hospitalist Frequent rechecks Telemetry She takes 3 baby aspirin's at home daily due to increased bruising with full dose aspirin. I recommend that we change her antiplatelet regimen to aspirin 81+ Plavix 75 for now.  Duration to be determined after vessel imaging High intensity statin for goal LDL less than 70. 2D echo CTA head/neck A1c Lipid panel Physical therapy, Occupational Therapy, speech therapy Permissive hypertension-treat only if systolic blood pressure is greater than 220 on appearing basis for the next 24 to 48 hours.  Then start normalizing blood pressure for goal discharge blood pressure of 140/90 or below. Plan was discussed with patient and family at bedside Plan was discussed with Dr. Loney Loh via secure chat Stroke team to follow ______________________________________________________________________    Signed, Milon Dikes, MD [facility]

## 2023-08-04 NOTE — ED Notes (Signed)
To CTA

## 2023-08-04 NOTE — ED Notes (Signed)
Pt returned from MRI °

## 2023-08-04 NOTE — ED Notes (Signed)
Patient ambulated to bathroom.

## 2023-08-04 NOTE — Progress Notes (Signed)
SLP Cancellation Note  Patient Details Name: Amber Barry MRN: [id number] DOB: 1941/08/05   Cancelled treatment:       Reason Eval/Treat Not Completed: Patient at procedure or test/unavailable (ECHO in room). Will f/u as able.    Mahala Menghini., M.A. CCC-SLP Acute Rehabilitation Services Office [phone]  Secure chat preferred  08/04/2023, 10:49 AM

## 2023-08-04 NOTE — Progress Notes (Signed)
Echocardiogram 2D Echocardiogram has been performed.  Amber Barry 08/04/2023, 10:56 AM

## 2023-08-05 LAB — URINE CULTURE: Culture: <10000 — AB

## 2023-08-11 DIAGNOSIS — E782 Mixed hyperlipidemia: Secondary | ICD-10-CM | POA: Diagnosis not present

## 2023-08-11 DIAGNOSIS — M1990 Unspecified osteoarthritis, unspecified site: Secondary | ICD-10-CM | POA: Diagnosis not present

## 2023-08-11 DIAGNOSIS — F331 Major depressive disorder, recurrent, moderate: Secondary | ICD-10-CM | POA: Diagnosis not present

## 2023-08-11 DIAGNOSIS — G2581 Restless legs syndrome: Secondary | ICD-10-CM | POA: Diagnosis not present

## 2023-08-11 DIAGNOSIS — R7303 Prediabetes: Secondary | ICD-10-CM | POA: Diagnosis not present

## 2023-08-11 DIAGNOSIS — I10 Essential (primary) hypertension: Secondary | ICD-10-CM | POA: Diagnosis not present

## 2023-08-11 DIAGNOSIS — N912 Amenorrhea, unspecified: Secondary | ICD-10-CM | POA: Diagnosis not present

## 2023-08-11 DIAGNOSIS — I639 Cerebral infarction, unspecified: Secondary | ICD-10-CM | POA: Diagnosis not present

## 2023-08-11 DIAGNOSIS — Z0000 Encounter for general adult medical examination without abnormal findings: Secondary | ICD-10-CM | POA: Diagnosis not present

## 2023-08-11 DIAGNOSIS — N183 Chronic kidney disease, stage 3 unspecified: Secondary | ICD-10-CM | POA: Diagnosis not present

## 2023-08-11 DIAGNOSIS — I129 Hypertensive chronic kidney disease with stage 1 through stage 4 chronic kidney disease, or unspecified chronic kidney disease: Secondary | ICD-10-CM | POA: Diagnosis not present

## 2023-09-02 NOTE — Progress Notes (Deleted)
[facility] [address] [place]. [place] [address] [phone]       HOSPITAL FOLLOW UP NOTE  Amber Barry Date of Birth:  1941-05-22 Medical Record Number:  [id number]   Reason for Referral:  hospital stroke follow up    SUBJECTIVE:   CHIEF COMPLAINT:  No chief complaint on file.   HPI:   Amber Barry is a 83 y.o. female with history of hypertension, CKD, lower back pain, ocular migraine, right CEA x 2 who presented on 08/03/23 with left facial droop, slurred speech and fatigue. Stroke work up revealed right external capsule/CR infarct likely secondary to small vessel disease.  Recommended DAPT for 3 weeks then Plavix alone and continued on Crestor 5 mg daily, LDL 43.  Evaluated by therapies without therapy needs, discharged home in stable condition.        PERTINENT IMAGING  CT no acute abnormality MRI Small right external capsule and CR infarct CT head and neck status post right CEA, atherosclerosis left ICA bulb and bilateral ICA siphon 2D Echo EF 55 to 60% LDL 43 HgbA1c 6.0    ROS:   14 system review of systems performed and negative with exception of ***  PMH:  Past Medical History:  Diagnosis Date   Anemia    pt denies   Anxiety    Arthritis    Osteoarthritis   Bursitis    left hip-had injection 2015   Carotid artery occlusion 11/2012   Left Bruit, 80% blockage   CKD (chronic kidney disease)    Stage III   Diverticulitis    Hematuria    Hypertension    Interstitial cystitis    Lower back pain 01/2014   Ocular migraine    Osteopenia    PONV (postoperative nausea and vomiting)     PSH:  Past Surgical History:  Procedure Laterality Date   ENDARTERECTOMY Right 12/22/2012   Procedure: Right Carotid Endarterectomy with hemashield patch angioplasty;  Surgeon: Pryor Ochoa, MD;  Location: [facility] OR;  Service: Vascular;  Laterality: Right;   ENDARTERECTOMY Right 01/13/2023   Procedure: RIGHT ENDARTERECTOMY COMMON  CAROTID;  Surgeon: Maeola Harman, MD;  Location: [facility] OR;  Service: Vascular;  Laterality: Right;   PATCH ANGIOPLASTY Right 01/13/2023   Procedure: PATCH ANGIOPLASTY USING HEMASHIELD PLATINUM PATCH 0.8CM X 7.6CM;  Surgeon: Maeola Harman, MD;  Location: [facility] OR;  Service: Vascular;  Laterality: Right;   TONSILLECTOMY  1947    Social History:  Social History   Socioeconomic History   Marital status: Widowed    Spouse name: Not on file   Number of children: Not on file   Years of education: Not on file   Highest education level: Not on file  Occupational History   Not on file  Tobacco Use   Smoking status: Never    Passive exposure: Never   Smokeless tobacco: Never  Vaping Use   Vaping status: Never Used  Substance and Sexual Activity   Alcohol use: No   Drug use: No   Sexual activity: Not Currently  Other Topics Concern   Not on file  Social History Narrative   Not on file   Social Drivers of Health   Financial Resource Strain: Not on file  Food Insecurity: No Food Insecurity (08/04/2023)   Hunger Vital Sign    Worried About Running Out of Food in the Last Year: Never true    Ran Out of Food in the Last Year: Never true  Transportation Needs: No Transportation Needs (08/04/2023)   PRAPARE - Administrator, Civil Service (Medical): No    Lack of Transportation (Non-Medical): No  Physical Activity: Not on file  Stress: Not on file  Social Connections: Not on file  Intimate Partner Violence: Not At Risk (08/04/2023)   Humiliation, Afraid, Rape, and Kick questionnaire    Fear of Current or Ex-Partner: No    Emotionally Abused: No    Physically Abused: No    Sexually Abused: No    Family History:  Family History  Problem Relation Age of Onset   COPD Mother        Respiratory Disease   Rheumatic fever Mother    Pulmonary disease Mother    Heart attack Mother    Stroke Father    Hypertension Father    Diabetes Paternal Aunt     Prostate cancer Maternal Grandfather    Hypertension Paternal Aunt    Hypertension Paternal Grandmother    Other Maternal Grandmother        hardening arteries   Stroke Other        paternal side    Medications:   Current Outpatient Medications on File Prior to Visit  Medication Sig Dispense Refill   acetaminophen (TYLENOL) 500 MG tablet Take 1,000 mg by mouth daily.     ALPRAZolam (XANAX) 0.25 MG tablet Take 0.125 mg by mouth 2 (two) times daily as needed for anxiety.      aspirin EC 81 MG tablet Take 1 tablet (81 mg total) by mouth daily. Swallow whole. 20 tablet 0   azelastine (ASTELIN) 0.1 % nasal spray Place 2 sprays into both nostrils 2 (two) times daily as needed for rhinitis or allergies.  12   Calcium-Vitamin D (CALTRATE 600 PLUS-VIT D PO) Take 1 tablet by mouth daily at 12 noon. Soft chews     celecoxib (CELEBREX) 200 MG capsule Take 200 mg by mouth 2 (two) times a week.     cholecalciferol (VITAMIN D3) 25 MCG (1000 UNIT) tablet Take 1,000 Units by mouth daily at 12 noon.     clopidogrel (PLAVIX) 75 MG tablet Take 1 tablet (75 mg total) by mouth daily. 30 tablet 11   escitalopram (LEXAPRO) 20 MG tablet Take 20 mg by mouth daily.     hydrochlorothiazide (HYDRODIURIL) 25 MG tablet Take 25 mg by mouth daily.   3   lisinopril (ZESTRIL) 10 MG tablet Take 10 mg by mouth daily.     omeprazole (PRILOSEC) 40 MG capsule Take 40 mg by mouth daily.     Probiotic Product (ALIGN) 4 MG CAPS Take 4 mg by mouth daily.     rosuvastatin (CRESTOR) 5 MG tablet Take 1 tablet (5 mg total) by mouth daily. 90 tablet 3   No current facility-administered medications on file prior to visit.    Allergies:   Allergies  Allergen Reactions   Benicar Hct [Olmesartan Medoxomil-Hctz] Swelling    Facial swelling   Buspar [Buspirone] Other (See Comments)    Facial Dysesthesia   Norvasc [Amlodipine Besylate] Swelling    legs   Zoloft [Sertraline Hcl] Anxiety    Increased anxiety, dysesthesia   Lipitor  [Atorvastatin] Other (See Comments)    Generalized muscle aches   Lisinopril Swelling    Around eyes   Motrin [Ibuprofen] Other (See Comments)     Contraindicated with Celebrex. Pt states MD said to not take together.   Amitriptyline Hcl Anxiety    And Paresthesias  Pravastatin Other (See Comments)    Pt. Wanted to sleep, no energy.      OBJECTIVE:  Physical Exam  There were no vitals filed for this visit. There is no height or weight on file to calculate BMI. No results found.      No data to display           General: well developed, well nourished, seated, in no evident distress Head: head normocephalic and atraumatic.   Neck: supple with no carotid or supraclavicular bruits Cardiovascular: regular rate and rhythm, no murmurs Musculoskeletal: no deformity Skin:  no rash/petichiae Vascular:  Normal pulses all extremities   Neurologic Exam Mental Status: Awake and fully alert. Oriented to place and time. Recent and remote memory intact. Attention span, concentration and fund of knowledge appropriate. Mood and affect appropriate.  Cranial Nerves: Fundoscopic exam reveals sharp disc margins. Pupils equal, briskly reactive to light. Extraocular movements full without nystagmus. Visual fields full to confrontation. Hearing intact. Facial sensation intact. Face, tongue, palate moves normally and symmetrically.  Motor: Normal bulk and tone. Normal strength in all tested extremity muscles Sensory.: intact to touch , pinprick , position and vibratory sensation.  Coordination: Rapid alternating movements normal in all extremities. Finger-to-nose and heel-to-shin performed accurately bilaterally. Gait and Station: Arises from chair without difficulty. Stance is normal. Gait demonstrates normal stride length and balance with ***. Tandem walk and heel toe ***.  Reflexes: 1+ and symmetric. Toes downgoing.     NIHSS  *** Modified Rankin  ***      ASSESSMENT: Amber Barry  is a 83 y.o. year old female with right external capsule/CR infarct on 08/03/2023 likely secondary to small vessel disease. Vascular risk factors include s/p R CEA x2, HTN, HLD, pre-DM ocular migraines and advanced age.      PLAN:  Ischemic stroke:  Residual deficit: ***.  Continue Plavix and rosuvastatin (Crestor) 5 mg daily for secondary stroke prevention.   Discussed secondary stroke prevention measures and importance of close PCP follow up for aggressive stroke risk factor management including BP goal<130/90, HLD with LDL goal<70 and pre-DM with A1c.<7 .  Stroke labs 07/2023: LDL 43, A1c 6.0 I have gone over the pathophysiology of stroke, warning signs and symptoms, risk factors and their management in some detail with instructions to go to the closest emergency room for symptoms of concern.     Follow up in *** or call earlier if needed   CC:  [facility] provider: Dr. Pearlean Brownie PCP: Debroah Loop, DO    I spent *** minutes of face-to-face and non-face-to-face time with patient.  This included previsit chart review including review of recent hospitalization, lab review, study review, order entry, electronic health record documentation, patient education regarding recent stroke including etiology, secondary stroke prevention measures and importance of managing stroke risk factors, residual deficits and typical recovery time and answered all other questions to patient satisfaction   Ihor Austin, AGNP-BC  [facility] [address] [place], [place] [address]  Phone [phone] Fax [phone] Note: This document was prepared with digital dictation and possible smart phrase technology. Any transcriptional errors that result from this process are unintentional.

## 2023-09-07 ENCOUNTER — Telehealth: Payer: Self-pay | Admitting: Adult Health

## 2023-09-07 ENCOUNTER — Inpatient Hospital Stay: Payer: Medicare Other | Admitting: Adult Health

## 2023-09-07 NOTE — Telephone Encounter (Signed)
Had to r/s pt HFU from 09/07/23 to 10/05/23 with Shanda Bumps. Pt dtr wants to know if they should continue with meds from hospital until then

## 2023-09-07 NOTE — Telephone Encounter (Signed)
Called the daughter back and advised that she should continue the plavix alone daily. Per the notes from Dr Roda Shutters. Continue asa and plavix 3 weeks and then plavix alone. She was appreciative for the call back and clarification. I have placed the patient on wait list.

## 2023-09-21 DIAGNOSIS — M5416 Radiculopathy, lumbar region: Secondary | ICD-10-CM | POA: Diagnosis not present

## 2023-09-21 DIAGNOSIS — M25511 Pain in right shoulder: Secondary | ICD-10-CM | POA: Diagnosis not present

## 2023-10-02 NOTE — Progress Notes (Signed)
 [facility] [address] [place]. [place] [address] [phone]       HOSPITAL FOLLOW UP NOTE  [name] Date of Birth:  [birth year] Medical Record Number:  [id number]   Reason for Referral:  hospital stroke follow up    SUBJECTIVE:   CHIEF COMPLAINT:  Chief Complaint  Patient presents with   Cerebrovascular Accident    Rm 3 with daughter Pt is well, reports she is having some fatigue and insomnia but overall stable. No other concerns.     HPI:   [name] is a 83 y.o. female with history of hypertension, CKD, lower back pain, ocular migraine, right CEA x 2 who presented on 08/03/23 with left facial droop, slurred speech and fatigue. Stroke work up revealed right external capsule/CR infarct likely secondary to small vessel disease.  Recommended DAPT for 3 weeks then Plavix alone and continued on Crestor 5 mg daily, LDL 43.  Evaluated by therapies without therapy needs, discharged home in stable condition.   Today, 10/05/2023, patient is being seen for initial hospital follow-up accompanied by her daughter.  Overall has been doing well since discharge.  Denies any residual weakness or speech changes.  She does c/o continued fatigue especially after little activity.  She essentially has returned back to all prior activities including driving but admits to not being as physically active more so due to the colder weather. Lives alone, maintains ADLs and IADLs independently.  Denies new stroke/TIA symptoms.  She reports over the past 2 weeks, she has been experiencing discomfort in her lower extremities in the evening time where she feels like she needs to get up and move, at times can start having a cramping sensation.  This has been causing difficulty to sleep at night.  She questions if this is due to Crestor. She also reports nocturia about 3-4 times per night and snoring when laying on her back.  She has not previously had a sleep study.  Completed  3 weeks DAPT, remains on Plavix alone as well as Crestor without side effects.  Blood pressure today elevated, has not been monitoring at home recently but previously blood pressure has been stable.  Routinely follows with PCP Dr. Judithann Sheen for stroke risk factor management.  Also follows with cardiology and vascular surgery.    PERTINENT IMAGING  CT no acute abnormality MRI Small right external capsule and CR infarct CT head and neck status post right CEA, atherosclerosis left ICA bulb and bilateral ICA siphon 2D Echo EF 55 to 60% LDL 43 HgbA1c 6.0    ROS:   14 system review of systems performed and negative with exception of those listed in HPI  PMH:  Past Medical History:  Diagnosis Date   Anemia    pt denies   Anxiety    Arthritis    Osteoarthritis   Bursitis    left hip-had injection 2015   Carotid artery occlusion 11/2012   Left Bruit, 80% blockage   CKD (chronic kidney disease)    Stage III   Diverticulitis    Hematuria    Hypertension    Interstitial cystitis    Lower back pain 01/2014   Ocular migraine    Osteopenia    PONV (postoperative nausea and vomiting)     PSH:  Past Surgical History:  Procedure Laterality Date   ENDARTERECTOMY Right 12/22/2012   Procedure: Right Carotid Endarterectomy with hemashield patch angioplasty;  Surgeon: Pryor Ochoa, MD;  Location: [facility] OR;  Service: Vascular;  Laterality: Right;   ENDARTERECTOMY Right 01/13/2023   Procedure: RIGHT ENDARTERECTOMY COMMON CAROTID;  Surgeon: Maeola Harman, MD;  Location: [facility] OR;  Service: Vascular;  Laterality: Right;   PATCH ANGIOPLASTY Right 01/13/2023   Procedure: PATCH ANGIOPLASTY USING HEMASHIELD PLATINUM PATCH 0.8CM X 7.6CM;  Surgeon: Maeola Harman, MD;  Location: [facility] OR;  Service: Vascular;  Laterality: Right;   TONSILLECTOMY  1947    Social History:  Social History   Socioeconomic History   Marital status: Widowed    Spouse name: Not on file   Number of children:  Not on file   Years of education: Not on file   Highest education level: Not on file  Occupational History   Not on file  Tobacco Use   Smoking status: Never    Passive exposure: Never   Smokeless tobacco: Never  Vaping Use   Vaping status: Never Used  Substance and Sexual Activity   Alcohol use: No   Drug use: No   Sexual activity: Not Currently  Other Topics Concern   Not on file  Social History Narrative   Not on file   Social Drivers of Health   Financial Resource Strain: Not on file  Food Insecurity: No Food Insecurity (08/04/2023)   Hunger Vital Sign    Worried About Running Out of Food in the Last Year: Never true    Ran Out of Food in the Last Year: Never true  Transportation Needs: No Transportation Needs (08/04/2023)   PRAPARE - Administrator, Civil Service (Medical): No    Lack of Transportation (Non-Medical): No  Physical Activity: Not on file  Stress: Not on file  Social Connections: Not on file  Intimate Partner Violence: Not At Risk (08/04/2023)   Humiliation, Afraid, Rape, and Kick questionnaire    Fear of Current or Ex-Partner: No    Emotionally Abused: No    Physically Abused: No    Sexually Abused: No    Family History:  Family History  Problem Relation Age of Onset   COPD Mother        Respiratory Disease   Rheumatic fever Mother    Pulmonary disease Mother    Heart attack Mother    Stroke Father    Hypertension Father    Diabetes Paternal Aunt    Prostate cancer Maternal Grandfather    Hypertension Paternal Aunt    Hypertension Paternal Grandmother    Other Maternal Grandmother        hardening arteries   Stroke Other        paternal side    Medications:   Current Outpatient Medications on File Prior to Visit  Medication Sig Dispense Refill   acetaminophen (TYLENOL) 500 MG tablet Take 1,000 mg by mouth daily.     ALPRAZolam (XANAX) 0.25 MG tablet Take 0.125 mg by mouth 2 (two) times daily as needed for anxiety.       azelastine (ASTELIN) 0.1 % nasal spray Place 2 sprays into both nostrils 2 (two) times daily as needed for rhinitis or allergies.  12   Calcium-Vitamin D (CALTRATE 600 PLUS-VIT D PO) Take 1 tablet by mouth daily at 12 noon. Soft chews     celecoxib (CELEBREX) 200 MG capsule Take 200 mg by mouth 2 (two) times a week.     cholecalciferol (VITAMIN D3) 25 MCG (1000 UNIT) tablet Take 1,000 Units by mouth daily at 12 noon.     clopidogrel (PLAVIX) 75 MG tablet Take  1 tablet (75 mg total) by mouth daily. 30 tablet 11   escitalopram (LEXAPRO) 20 MG tablet Take 20 mg by mouth daily.     hydrochlorothiazide (HYDRODIURIL) 25 MG tablet Take 25 mg by mouth daily.   3   lisinopril (ZESTRIL) 10 MG tablet Take 10 mg by mouth daily.     omeprazole (PRILOSEC) 40 MG capsule Take 40 mg by mouth daily.     Probiotic Product (ALIGN) 4 MG CAPS Take 4 mg by mouth daily.     rosuvastatin (CRESTOR) 5 MG tablet Take 1 tablet (5 mg total) by mouth daily. 90 tablet 3   No current facility-administered medications on file prior to visit.    Allergies:   Allergies  Allergen Reactions   Benicar Hct [Olmesartan Medoxomil-Hctz] Swelling    Facial swelling   Buspar [Buspirone] Other (See Comments)    Facial Dysesthesia   Norvasc [Amlodipine Besylate] Swelling    legs   Zoloft [Sertraline Hcl] Anxiety    Increased anxiety, dysesthesia   Lipitor [Atorvastatin] Other (See Comments)    Generalized muscle aches   Lisinopril Swelling    Around eyes   Motrin [Ibuprofen] Other (See Comments)     Contraindicated with Celebrex. Pt states MD said to not take together.   Amitriptyline Hcl Anxiety    And Paresthesias   Pravastatin Other (See Comments)    Pt. Wanted to sleep, no energy.      OBJECTIVE:  Physical Exam  Vitals:   10/05/23 0906 10/05/23 0909  BP: (!) 175/75 (!) 182/79  Pulse: 80 75  Weight: 123 lb (55.8 kg)   Height: 4\' 10"  (1.473 m)    Body mass index is 25.71 kg/m. No results found.  General:  well developed, well nourished, very pleasant elderly Caucasian female, seated, in no evident distress Head: head normocephalic and atraumatic.   Neck: supple with no carotid or supraclavicular bruits Cardiovascular: regular rate and rhythm, no murmurs Musculoskeletal: no deformity Skin:  no rash/petichiae Vascular:  Normal pulses all extremities   Neurologic Exam Mental Status: Awake and fully alert.  Fluent speech and language.  Oriented to place and time. Recent and remote memory intact. Attention span, concentration and fund of knowledge appropriate. Mood and affect appropriate.  Cranial Nerves: Fundoscopic exam reveals sharp disc margins. Pupils equal, briskly reactive to light. Extraocular movements full without nystagmus. Visual fields full to confrontation. Hearing intact. Facial sensation intact.  Very slight left nasolabial fold flattening.  Tongue, palate moves normally and symmetrically.  Motor: Normal bulk and tone. Normal strength in all tested extremity muscles except very slight LUE weakness with right arm orbiting left arm Sensory.: intact to touch , pinprick , position and vibratory sensation.  Coordination: Rapid alternating movements normal in all extremities. Finger-to-nose and heel-to-shin performed accurately bilaterally. Gait and Station: Arises from chair without difficulty. Stance is normal. Gait demonstrates slow cautious steps without use of AD.  Tandem walk and heel toe not attempted.  Reflexes: 1+ and symmetric. Toes downgoing.     NIHSS  1 Modified Rankin  1      ASSESSMENT: Amber Barry is a 83 y.o. year old female with right external capsule/CR infarct on 08/03/2023 likely secondary to small vessel disease. Vascular risk factors include s/p R CEA x2, HTN, HLD, pre-DM ocular migraines and advanced age.      PLAN:  Ischemic stroke:  Residual deficit: very slight LUE weakness, patient working on exercises at home. Will likely continue to improve.   Residual  fatigue, discussed gradually increasing daytime activity/exercise.  Discussed typical recovery time. Continue Plavix and rosuvastatin (Crestor) 5 mg daily for secondary stroke prevention managed/prescribed by PCP/cardiology Discussed secondary stroke prevention measures and importance of close PCP follow up for aggressive stroke risk factor management including BP goal<130/90, HLD with LDL goal<70 and pre-DM with A1c.<7 . Advised to monitor BP at home, if remains elevated, was advised to f/u with PCP Stroke labs 07/2023: LDL 43, A1c 6.0 I have gone over the pathophysiology of stroke, warning signs and symptoms, risk factors and their management in some detail with instructions to go to the closest emergency room for symptoms of concern.  At risk for sleep apnea:  c/o daytime fatigue, nocturia, snoring and RLS symptoms.  Offered referral for sleep evaluation but declines interest at this time.   Discussed risk of untreated sleep apnea.   She will call if interested in pursuing in the future  Nocturnal cramping RLS symptoms Present over the past 2 weeks Discussed holding Crestor for 3-4 days to see if any improvement in symptoms. If improves, advised to contact cardiology to discuss other HLD treatment options. If symptoms persist, she was advised to restart Will check lab work today to look for other causes Discussed conservative measures to do at home If symptoms persist, would recommend f/u with PCP for further treatment options    Doing well from stroke standpoint without further recommendations and risk factors are managed by PCP. She may follow up PRN, as usual for our patients who are strictly being followed for stroke. If any new neurological issues should arise, request PCP place referral for evaluation by one of our neurologists. Thank you.     CC:  [facility] provider: Dr. Pearlean Brownie PCP: Debroah Loop, DO    I spent 59 minutes of face-to-face and non-face-to-face time with  patient and daughter.  This included previsit chart review including review of recent hospitalization, lab review, study review, order entry, electronic health record documentation, patient education regarding recent stroke including etiology, secondary stroke prevention measures and importance of managing stroke risk factors, residual deficits and typical recovery time and answered all other questions to patient and daughters satisfaction  Ihor Austin, AGNP-BC  [facility] [address] [place], [place] [address]  Phone [phone] Fax [phone] Note: This document was prepared with digital dictation and possible smart phrase technology. Any transcriptional errors that result from this process are unintentional.

## 2023-10-05 ENCOUNTER — Ambulatory Visit (INDEPENDENT_AMBULATORY_CARE_PROVIDER_SITE_OTHER): Payer: Medicare Other | Admitting: Adult Health

## 2023-10-05 ENCOUNTER — Encounter: Payer: Self-pay | Admitting: Adult Health

## 2023-10-05 VITALS — BP 182/79 | HR 75 | Ht <= 58 in | Wt 123.0 lb

## 2023-10-05 DIAGNOSIS — I639 Cerebral infarction, unspecified: Secondary | ICD-10-CM | POA: Diagnosis not present

## 2023-10-05 DIAGNOSIS — G2581 Restless legs syndrome: Secondary | ICD-10-CM

## 2023-10-05 DIAGNOSIS — G4762 Sleep related leg cramps: Secondary | ICD-10-CM | POA: Diagnosis not present

## 2023-10-05 DIAGNOSIS — Z9189 Other specified personal risk factors, not elsewhere classified: Secondary | ICD-10-CM

## 2023-10-05 NOTE — Patient Instructions (Signed)
 Continue clopidogrel 75 mg daily for secondary stroke prevention  Recommend holding Crestor for 3-4 days to see if cramping improves. If it does, please reach out to your cardiologist to discuss other medication options.  We will check lab work today to look for other causes, if this is all normal but your symptoms persist after holding Crestor, please follow up with your PCP to discuss other treatment options.   Consider sleep evaluation to evaluate for possible sleep apnea - if you are interested in pursuing, please let me know  Continue to follow up with PCP regarding blood pressure and cholesterol management  Maintain strict control of hypertension with blood pressure goal below 130/90 and cholesterol with LDL cholesterol (bad cholesterol) goal below 70 mg/dL.   Signs of a Stroke? Follow the BEFAST method:  Balance Watch for a sudden loss of balance, trouble with coordination or vertigo Eyes Is there a sudden loss of vision in one or both eyes? Or double vision?  Face: Ask the person to smile. Does one side of the face droop or is it numb?  Arms: Ask the person to raise both arms. Does one arm drift downward? Is there weakness or numbness of a leg? Speech: Ask the person to repeat a simple phrase. Does the speech sound slurred/strange? Is the person confused ? Time: If you observe any of these signs, call 911.      Thank you for coming to see [place] at [facility]. I hope we have been able to provide you high quality care today.  You may receive a patient satisfaction survey over the next few weeks. We would appreciate your feedback and comments so that we may continue to improve ourselves and the health of our patients.

## 2023-10-06 ENCOUNTER — Encounter: Payer: Self-pay | Admitting: Adult Health

## 2023-10-06 LAB — BASIC METABOLIC PANEL
BUN/Creatinine Ratio: 23 (ref 12–28)
BUN: 23 mg/dL (ref 8–27)
CO2: 20 mmol/L (ref 20–29)
Calcium: 9.9 mg/dL (ref 8.7–10.3)
Chloride: 91 mmol/L — ABNORMAL LOW (ref 96–106)
Creatinine, Ser: 1 mg/dL (ref 0.57–1.00)
Glucose: 93 mg/dL (ref 70–99)
Potassium: 5 mmol/L (ref 3.5–5.2)
Sodium: 129 mmol/L — ABNORMAL LOW (ref 134–144)
eGFR: 56 mL/min/{1.73_m2} — ABNORMAL LOW (ref >59)

## 2023-10-06 LAB — MAGNESIUM: Magnesium: 1.5 mg/dL — ABNORMAL LOW (ref 1.6–2.3)

## 2023-10-06 LAB — IRON,TIBC AND FERRITIN PANEL
Ferritin: 42 ng/mL (ref 15–150)
Iron Saturation: 24 % (ref 15–55)
Iron: 80 ug/dL (ref 27–139)
Total Iron Binding Capacity: 339 ug/dL (ref 250–450)
UIBC: 259 ug/dL (ref 118–369)

## 2023-10-06 LAB — THYROID PANEL WITH TSH
Free Thyroxine Index: 2 (ref 1.2–4.9)
T3 Uptake Ratio: 32 % (ref 24–39)
T4, Total: 6.4 ug/dL (ref 4.5–12.0)
TSH: 1.73 u[IU]/mL (ref 0.450–4.500)

## 2023-10-08 DIAGNOSIS — M6281 Muscle weakness (generalized): Secondary | ICD-10-CM | POA: Diagnosis not present

## 2023-10-08 DIAGNOSIS — S39012D Strain of muscle, fascia and tendon of lower back, subsequent encounter: Secondary | ICD-10-CM | POA: Diagnosis not present

## 2023-10-08 DIAGNOSIS — M5416 Radiculopathy, lumbar region: Secondary | ICD-10-CM | POA: Diagnosis not present

## 2023-10-13 ENCOUNTER — Encounter: Payer: Self-pay | Admitting: Internal Medicine

## 2023-10-14 DIAGNOSIS — S39012D Strain of muscle, fascia and tendon of lower back, subsequent encounter: Secondary | ICD-10-CM | POA: Diagnosis not present

## 2023-10-14 DIAGNOSIS — M5416 Radiculopathy, lumbar region: Secondary | ICD-10-CM | POA: Diagnosis not present

## 2023-10-14 DIAGNOSIS — M6281 Muscle weakness (generalized): Secondary | ICD-10-CM | POA: Diagnosis not present

## 2023-10-16 NOTE — Progress Notes (Signed)
 I agree with the above plan

## 2023-10-21 DIAGNOSIS — M5416 Radiculopathy, lumbar region: Secondary | ICD-10-CM | POA: Diagnosis not present

## 2023-10-21 DIAGNOSIS — S39012D Strain of muscle, fascia and tendon of lower back, subsequent encounter: Secondary | ICD-10-CM | POA: Diagnosis not present

## 2023-10-21 DIAGNOSIS — M6281 Muscle weakness (generalized): Secondary | ICD-10-CM | POA: Diagnosis not present

## 2023-10-26 ENCOUNTER — Other Ambulatory Visit: Payer: Self-pay

## 2023-10-26 DIAGNOSIS — I6521 Occlusion and stenosis of right carotid artery: Secondary | ICD-10-CM

## 2023-10-27 DIAGNOSIS — S39012D Strain of muscle, fascia and tendon of lower back, subsequent encounter: Secondary | ICD-10-CM | POA: Diagnosis not present

## 2023-10-27 DIAGNOSIS — M5416 Radiculopathy, lumbar region: Secondary | ICD-10-CM | POA: Diagnosis not present

## 2023-10-27 DIAGNOSIS — M6281 Muscle weakness (generalized): Secondary | ICD-10-CM | POA: Diagnosis not present

## 2023-11-02 DIAGNOSIS — M545 Low back pain, unspecified: Secondary | ICD-10-CM | POA: Diagnosis not present

## 2023-11-02 DIAGNOSIS — M25511 Pain in right shoulder: Secondary | ICD-10-CM | POA: Diagnosis not present

## 2023-11-04 ENCOUNTER — Ambulatory Visit (INDEPENDENT_AMBULATORY_CARE_PROVIDER_SITE_OTHER): Payer: Medicare Other | Admitting: Physician Assistant

## 2023-11-04 ENCOUNTER — Ambulatory Visit (HOSPITAL_COMMUNITY)
Admission: RE | Admit: 2023-11-04 | Discharge: 2023-11-04 | Disposition: A | Discharge status: Home / Self Care | Payer: Medicare Other | Source: Ambulatory Visit | Attending: Vascular Surgery | Admitting: Vascular Surgery

## 2023-11-04 ENCOUNTER — Encounter: Payer: Self-pay | Admitting: Physician Assistant

## 2023-11-04 VITALS — BP 136/86 | HR 84 | Temp 97.3°F | Ht <= 58 in | Wt 121.4 lb

## 2023-11-04 DIAGNOSIS — I6521 Occlusion and stenosis of right carotid artery: Secondary | ICD-10-CM | POA: Diagnosis not present

## 2023-11-04 DIAGNOSIS — I6523 Occlusion and stenosis of bilateral carotid arteries: Secondary | ICD-10-CM

## 2023-11-04 NOTE — Progress Notes (Signed)
 Office Note   History of Present Illness   Amber Barry is a 83 y.o. (25-Feb-1941) female who presents for surveillance of carotid artery stenosis.  She has a history of redo right carotid endarterectomy on 01/13/2023 by Dr. Randie Heinz.  This was done for asymptomatic recurrent critical right ICA stenosis.  Her first carotid endarterectomy on the right was done in 2014 by Dr. Hart Rochester.  She returns today for follow-up.  She states she is doing well at today's visit.  She was recently hospitalized in December with slurred speech, left-sided weakness, and left-sided facial droop.  She was found to have acute infarct of the right lentiform nucleus/external capsule secondary to small vessel disease.  Her slurred speech and weakness is resolved.  She has already completed a 3-week course of DAPT, and is now on Plavix alone.  She denies any new or worsening neurological symptoms since December.  Current Outpatient Medications  Medication Sig Dispense Refill   acetaminophen (TYLENOL) 500 MG tablet Take 1,000 mg by mouth daily.     ALPRAZolam (XANAX) 0.25 MG tablet Take 0.125 mg by mouth 2 (two) times daily as needed for anxiety.      azelastine (ASTELIN) 0.1 % nasal spray Place 2 sprays into both nostrils 2 (two) times daily as needed for rhinitis or allergies.  12   Calcium-Vitamin D (CALTRATE 600 PLUS-VIT D PO) Take 1 tablet by mouth daily at 12 noon. Soft chews     celecoxib (CELEBREX) 200 MG capsule Take 200 mg by mouth 2 (two) times a week.     cholecalciferol (VITAMIN D3) 25 MCG (1000 UNIT) tablet Take 1,000 Units by mouth daily at 12 noon.     clopidogrel (PLAVIX) 75 MG tablet Take 1 tablet (75 mg total) by mouth daily. 30 tablet 11   escitalopram (LEXAPRO) 20 MG tablet Take 20 mg by mouth daily.     hydrochlorothiazide (HYDRODIURIL) 25 MG tablet Take 25 mg by mouth daily.   3   lisinopril (ZESTRIL) 10 MG tablet Take 10 mg by mouth daily.     omeprazole (PRILOSEC) 40 MG capsule Take 40 mg by mouth  daily.     Probiotic Product (ALIGN) 4 MG CAPS Take 4 mg by mouth daily.     rosuvastatin (CRESTOR) 5 MG tablet Take 1 tablet (5 mg total) by mouth daily. 90 tablet 3   No current facility-administered medications for this visit.    REVIEW OF SYSTEMS (negative unless checked):   Cardiac:  []  Chest pain or chest pressure? []  Shortness of breath upon activity? []  Shortness of breath when lying flat? []  Irregular heart rhythm?  Vascular:  []  Pain in calf, thigh, or hip brought on by walking? []  Pain in feet at night that wakes you up from your sleep? []  Blood clot in your veins? []  Leg swelling?  Pulmonary:  []  Oxygen at home? []  Productive cough? []  Wheezing?  Neurologic:  []  Sudden weakness in arms or legs? []  Sudden numbness in arms or legs? []  Sudden onset of difficult speaking or slurred speech? []  Temporary loss of vision in one eye? []  Problems with dizziness?  Gastrointestinal:  []  Blood in stool? []  Vomited blood?  Genitourinary:  []  Burning when urinating? []  Blood in urine?  Psychiatric:  []  Major depression  Hematologic:  []  Bleeding problems? []  Problems with blood clotting?  Dermatologic:  []  Rashes or ulcers?  Constitutional:  []  Fever or chills?  Ear/Nose/Throat:  []  Change in hearing? []  Nose bleeds? []   Sore throat?  Musculoskeletal:  []  Back pain? []  Joint pain? []  Muscle pain?   Physical Examination   Vitals:   11/04/23 1337  BP: 136/86  Pulse: 84  Temp: (!) 97.3 F (36.3 C)  SpO2: 98%  Weight: 121 lb 6.4 oz (55.1 kg)  Height: 4\' 10"  (1.473 m)   Body mass index is 25.37 kg/m.  General:  WDWN in NAD; vital signs documented above Gait: Not observed HENT: WNL, normocephalic Pulmonary: normal non-labored breathing , without rales, rhonchi,  wheezing Cardiac: regular Abdomen: soft, NT, no masses Skin: without rashes Vascular Exam/Pulses: palpable radial pulses bilaterally Extremities: without ischemic changes, without  gangrene , without cellulitis; without open wounds;  Musculoskeletal: no muscle wasting or atrophy  Neurologic: A&O X 3;  No focal weakness or paresthesias are detected Psychiatric:  The pt has Normal affect.  Non-Invasive Vascular Imaging   Bilateral Carotid Duplex (11/04/2023):  R ICA stenosis:  1-39% R VA:  patent and antegrade L ICA stenosis:  1-39% L VA:  patent and antegrade   Medical Decision Making   Amber Barry is a 83 y.o. female who presents for surveillance of carotid artery stenosis  Based on the patient's vascular studies, her carotid artery stenosis is unchanged bilaterally at 1 to 39% She was recently hospitalized in December 2024 with acute infarct of the right lentiform nucleus.  She presented to the hospital with slurred speech, left-sided facial droop, and left-sided weakness.  Her symptoms are resolved now She denies any new or recurrent strokelike symptoms such as slurred speech, facial droop, sudden weakness/numbness, or amaurosis fugax She has no neuro deficits on exam. There are palpable and equal radial pulses bilaterally Recommend that she continue her daily Plavix and Crestor.  She can follow-up with our office in 1 year with repeat carotid duplex   Loel Dubonnet PA-C [facility] Office: [phone]  Clinic MD: Randie Heinz

## 2023-11-11 DIAGNOSIS — M6281 Muscle weakness (generalized): Secondary | ICD-10-CM | POA: Diagnosis not present

## 2023-11-11 DIAGNOSIS — S39012D Strain of muscle, fascia and tendon of lower back, subsequent encounter: Secondary | ICD-10-CM | POA: Diagnosis not present

## 2023-11-11 DIAGNOSIS — M5416 Radiculopathy, lumbar region: Secondary | ICD-10-CM | POA: Diagnosis not present

## 2023-11-16 DIAGNOSIS — H40053 Ocular hypertension, bilateral: Secondary | ICD-10-CM | POA: Diagnosis not present

## 2023-11-16 DIAGNOSIS — H2513 Age-related nuclear cataract, bilateral: Secondary | ICD-10-CM | POA: Diagnosis not present

## 2023-11-16 DIAGNOSIS — H25013 Cortical age-related cataract, bilateral: Secondary | ICD-10-CM | POA: Diagnosis not present

## 2023-11-18 DIAGNOSIS — M5416 Radiculopathy, lumbar region: Secondary | ICD-10-CM | POA: Diagnosis not present

## 2023-11-18 DIAGNOSIS — S39012D Strain of muscle, fascia and tendon of lower back, subsequent encounter: Secondary | ICD-10-CM | POA: Diagnosis not present

## 2023-11-18 DIAGNOSIS — M6281 Muscle weakness (generalized): Secondary | ICD-10-CM | POA: Diagnosis not present

## 2023-11-25 DIAGNOSIS — S39012D Strain of muscle, fascia and tendon of lower back, subsequent encounter: Secondary | ICD-10-CM | POA: Diagnosis not present

## 2023-11-25 DIAGNOSIS — M5416 Radiculopathy, lumbar region: Secondary | ICD-10-CM | POA: Diagnosis not present

## 2023-11-25 DIAGNOSIS — M6281 Muscle weakness (generalized): Secondary | ICD-10-CM | POA: Diagnosis not present

## 2023-12-02 DIAGNOSIS — M5416 Radiculopathy, lumbar region: Secondary | ICD-10-CM | POA: Diagnosis not present

## 2023-12-02 DIAGNOSIS — S39012D Strain of muscle, fascia and tendon of lower back, subsequent encounter: Secondary | ICD-10-CM | POA: Diagnosis not present

## 2023-12-02 DIAGNOSIS — M6281 Muscle weakness (generalized): Secondary | ICD-10-CM | POA: Diagnosis not present

## 2023-12-11 ENCOUNTER — Other Ambulatory Visit: Payer: Self-pay

## 2023-12-11 ENCOUNTER — Emergency Department (HOSPITAL_COMMUNITY)

## 2023-12-11 ENCOUNTER — Encounter (HOSPITAL_COMMUNITY): Payer: Self-pay

## 2023-12-11 ENCOUNTER — Emergency Department (HOSPITAL_COMMUNITY)
Admission: EM | Admit: 2023-12-11 | Discharge: 2023-12-11 | Disposition: A | Discharge status: Home / Self Care | Attending: Emergency Medicine | Admitting: Emergency Medicine

## 2023-12-11 DIAGNOSIS — S8002XA Contusion of left knee, initial encounter: Secondary | ICD-10-CM | POA: Insufficient documentation

## 2023-12-11 DIAGNOSIS — I129 Hypertensive chronic kidney disease with stage 1 through stage 4 chronic kidney disease, or unspecified chronic kidney disease: Secondary | ICD-10-CM | POA: Diagnosis not present

## 2023-12-11 DIAGNOSIS — M25561 Pain in right knee: Secondary | ICD-10-CM | POA: Diagnosis not present

## 2023-12-11 DIAGNOSIS — M25562 Pain in left knee: Secondary | ICD-10-CM | POA: Diagnosis not present

## 2023-12-11 DIAGNOSIS — R609 Edema, unspecified: Secondary | ICD-10-CM | POA: Diagnosis not present

## 2023-12-11 DIAGNOSIS — W01198A Fall on same level from slipping, tripping and stumbling with subsequent striking against other object, initial encounter: Secondary | ICD-10-CM | POA: Diagnosis not present

## 2023-12-11 DIAGNOSIS — S0181XA Laceration without foreign body of other part of head, initial encounter: Secondary | ICD-10-CM | POA: Diagnosis not present

## 2023-12-11 DIAGNOSIS — Z23 Encounter for immunization: Secondary | ICD-10-CM | POA: Diagnosis not present

## 2023-12-11 DIAGNOSIS — S199XXA Unspecified injury of neck, initial encounter: Secondary | ICD-10-CM | POA: Diagnosis not present

## 2023-12-11 DIAGNOSIS — S8991XA Unspecified injury of right lower leg, initial encounter: Secondary | ICD-10-CM | POA: Diagnosis not present

## 2023-12-11 DIAGNOSIS — S0990XA Unspecified injury of head, initial encounter: Secondary | ICD-10-CM | POA: Diagnosis not present

## 2023-12-11 DIAGNOSIS — M25551 Pain in right hip: Secondary | ICD-10-CM | POA: Diagnosis not present

## 2023-12-11 DIAGNOSIS — S299XXA Unspecified injury of thorax, initial encounter: Secondary | ICD-10-CM | POA: Diagnosis not present

## 2023-12-11 DIAGNOSIS — S3993XA Unspecified injury of pelvis, initial encounter: Secondary | ICD-10-CM | POA: Diagnosis not present

## 2023-12-11 DIAGNOSIS — M1711 Unilateral primary osteoarthritis, right knee: Secondary | ICD-10-CM | POA: Diagnosis not present

## 2023-12-11 DIAGNOSIS — Z79899 Other long term (current) drug therapy: Secondary | ICD-10-CM | POA: Diagnosis not present

## 2023-12-11 DIAGNOSIS — S8001XA Contusion of right knee, initial encounter: Secondary | ICD-10-CM | POA: Insufficient documentation

## 2023-12-11 DIAGNOSIS — R220 Localized swelling, mass and lump, head: Secondary | ICD-10-CM | POA: Diagnosis not present

## 2023-12-11 DIAGNOSIS — S0993XA Unspecified injury of face, initial encounter: Secondary | ICD-10-CM | POA: Diagnosis not present

## 2023-12-11 DIAGNOSIS — N189 Chronic kidney disease, unspecified: Secondary | ICD-10-CM | POA: Insufficient documentation

## 2023-12-11 DIAGNOSIS — S8992XA Unspecified injury of left lower leg, initial encounter: Secondary | ICD-10-CM | POA: Diagnosis not present

## 2023-12-11 DIAGNOSIS — I10 Essential (primary) hypertension: Secondary | ICD-10-CM | POA: Diagnosis not present

## 2023-12-11 DIAGNOSIS — M25461 Effusion, right knee: Secondary | ICD-10-CM | POA: Diagnosis not present

## 2023-12-11 DIAGNOSIS — M160 Bilateral primary osteoarthritis of hip: Secondary | ICD-10-CM | POA: Diagnosis not present

## 2023-12-11 DIAGNOSIS — W19XXXA Unspecified fall, initial encounter: Secondary | ICD-10-CM | POA: Diagnosis not present

## 2023-12-11 LAB — I-STAT CHEM 8, ED
BUN: 32 mg/dL — ABNORMAL HIGH (ref 8–23)
Calcium, Ion: 1.14 mmol/L — ABNORMAL LOW (ref 1.15–1.40)
Chloride: 102 mmol/L (ref 98–111)
Creatinine, Ser: 1.3 mg/dL — ABNORMAL HIGH (ref 0.44–1.00)
Glucose, Bld: 143 mg/dL — ABNORMAL HIGH (ref 70–99)
HCT: 39 % (ref 36.0–46.0)
Hemoglobin: 13.3 g/dL (ref 12.0–15.0)
Potassium: 4.4 mmol/L (ref 3.5–5.1)
Sodium: 134 mmol/L — ABNORMAL LOW (ref 135–145)
TCO2: 22 mmol/L (ref 22–32)

## 2023-12-11 LAB — CBC
HCT: 36.9 % (ref 36.0–46.0)
Hemoglobin: 12.3 g/dL (ref 12.0–15.0)
MCH: 30.4 pg (ref 26.0–34.0)
MCHC: 33.3 g/dL (ref 30.0–36.0)
MCV: 91.1 fL (ref 80.0–100.0)
Platelets: 248 10*3/uL (ref 150–400)
RBC: 4.05 MIL/uL (ref 3.87–5.11)
RDW: 12.8 % (ref 11.5–15.5)
WBC: 14.5 10*3/uL — ABNORMAL HIGH (ref 4.0–10.5)
nRBC: 0 % (ref 0.0–0.2)

## 2023-12-11 LAB — COMPREHENSIVE METABOLIC PANEL WITH GFR
ALT: 14 U/L (ref 0–44)
AST: 23 U/L (ref 15–41)
Albumin: 4.3 g/dL (ref 3.5–5.0)
Alkaline Phosphatase: 39 U/L (ref 38–126)
Anion gap: 13 (ref 5–15)
BUN: 29 mg/dL — ABNORMAL HIGH (ref 8–23)
CO2: 20 mmol/L — ABNORMAL LOW (ref 22–32)
Calcium: 9.7 mg/dL (ref 8.9–10.3)
Chloride: 101 mmol/L (ref 98–111)
Creatinine, Ser: 1.28 mg/dL — ABNORMAL HIGH (ref 0.44–1.00)
GFR, Estimated: 42 mL/min — ABNORMAL LOW (ref >60)
Glucose, Bld: 144 mg/dL — ABNORMAL HIGH (ref 70–99)
Potassium: 4.5 mmol/L (ref 3.5–5.1)
Sodium: 134 mmol/L — ABNORMAL LOW (ref 135–145)
Total Bilirubin: 0.6 mg/dL (ref 0.0–1.2)
Total Protein: 7.1 g/dL (ref 6.5–8.1)

## 2023-12-11 LAB — I-STAT CG4 LACTIC ACID, ED: Lactic Acid, Venous: 1.5 mmol/L (ref 0.5–1.9)

## 2023-12-11 LAB — CK TOTAL AND CKMB (NOT AT ARMC)
CK, MB: 3.1 ng/mL (ref 0.5–5.0)
Total CK: 88 U/L (ref 38–234)

## 2023-12-11 LAB — ETHANOL: Alcohol, Ethyl (B): <15 mg/dL (ref <15)

## 2023-12-11 LAB — SAMPLE TO BLOOD BANK

## 2023-12-11 LAB — PROTIME-INR
INR: 1.1 (ref 0.8–1.2)
Prothrombin Time: 14.4 s (ref 11.4–15.2)

## 2023-12-11 MED ORDER — TETANUS-DIPHTH-ACELL PERTUSSIS 5-2.5-18.5 LF-MCG/0.5 IM SUSY
0.5000 mL | PREFILLED_SYRINGE | Freq: Once | INTRAMUSCULAR | Status: AC
  Administered 2023-12-11: 0.5 mL via INTRAMUSCULAR
  Filled 2023-12-11: qty 0.5

## 2023-12-11 MED ORDER — MORPHINE SULFATE (PF) 4 MG/ML IV SOLN: 4.0000 mg | Freq: Once | INTRAVENOUS | Status: DC

## 2023-12-11 MED ORDER — OXYCODONE HCL 5 MG PO TABS
2.5000 mg | ORAL_TABLET | Freq: Once | ORAL | Status: AC
  Administered 2023-12-11: 2.5 mg via ORAL
  Filled 2023-12-11: qty 1

## 2023-12-11 MED ORDER — ACETAMINOPHEN 325 MG PO TABS
325.0000 mg | ORAL_TABLET | Freq: Once | ORAL | Status: AC
  Administered 2023-12-11: 325 mg via ORAL
  Filled 2023-12-11: qty 1

## 2023-12-11 MED ORDER — KETOROLAC TROMETHAMINE 15 MG/ML IJ SOLN: 15.0000 mg | Freq: Once | INTRAMUSCULAR | Status: DC

## 2023-12-11 MED ORDER — LACTATED RINGERS IV BOLUS
1000.0000 mL | Freq: Once | INTRAVENOUS | Status: AC
  Administered 2023-12-11: 1000 mL via INTRAVENOUS

## 2023-12-11 NOTE — ED Notes (Signed)
 Facial laceration cleansed with NS.

## 2023-12-11 NOTE — Discharge Instructions (Signed)
 You were seen in the ED for evaluation after fall.  CT imaging and x-ray imaging did not show any emergency causes to her symptoms today.  You can take Tylenol , 650 mg to 1 g every 6 hours as needed for pain.  If you take the maximum dose of 1 g every 6 hours, do not do this for more than 5 to 7 days.  you can also use IcyHot patches or lidocaine  patches to the areas that are sensitive.  Please make sure to use a walker over the next few days while you experience soreness, and follow-up with your PCP in 1 week for reevaluation.  You had a small cut over your nose, which was repaired with glue.  You do not have to have this removed.  Please return to the ED if you experience any worsening pain leading to inability to walk, or any new fevers, confusion, worsening headaches, or any other emergency medical symptoms.

## 2023-12-11 NOTE — ED Notes (Signed)
 Trauma Response Nurse Documentation   Amber Barry is a 83 y.o. female arriving to [facility] ED via EMS  On clopidogrel  75 mg daily. Trauma was activated as a Level 2 by ED Charge RN based on the following trauma criteria Elderly patients > 65 with head trauma on anti-coagulation (excluding ASA).  Patient cleared for CT by Dr. Delana Favors. Pt transported to CT with trauma response nurse present to monitor. RN remained with the patient throughout their absence from the department for clinical observation.   GCS 15.  History   Past Medical History:  Diagnosis Date   Anemia    pt denies   Anxiety    Arthritis    Osteoarthritis   Bursitis    left hip-had injection 2015   Carotid artery occlusion 11/2012   Left Bruit, 80% blockage   CKD (chronic kidney disease)    Stage III   Diverticulitis    Hematuria    Hypertension    Interstitial cystitis    Lower back pain 01/2014   Ocular migraine    Osteopenia    PONV (postoperative nausea and vomiting)      Past Surgical History:  Procedure Laterality Date   ENDARTERECTOMY Right 12/22/2012   Procedure: Right Carotid Endarterectomy with hemashield patch angioplasty;  Surgeon: Palma Bob, MD;  Location: [facility] OR;  Service: Vascular;  Laterality: Right;   ENDARTERECTOMY Right 01/13/2023   Procedure: RIGHT ENDARTERECTOMY COMMON CAROTID;  Surgeon: Adine Hoof, MD;  Location: [facility] OR;  Service: Vascular;  Laterality: Right;   PATCH ANGIOPLASTY Right 01/13/2023   Procedure: PATCH ANGIOPLASTY USING HEMASHIELD PLATINUM PATCH 0.8CM X 7.6CM;  Surgeon: Adine Hoof, MD;  Location: [facility] OR;  Service: Vascular;  Laterality: Right;   TONSILLECTOMY  1947     Initial Focused Assessment (If applicable, or please see trauma documentation): Airway: intact, patent Breathing: Breath sounds clear, equal bilaterally. SpO2 100% on RA. No CP or SOB. Circulation: Approx 1cm lac to bridge of nose with trickle blood.  Small horizontal lac/abrasion  to mid forehead.  Bleeding controlled. Pulses intact distal to injury.  Disability: A&Ox4. PERRLA 2's. MAE w/ exception to RLE due to pain.  VS: WDL C/O pain to RLE  CT's Completed:   CT Head, CT Maxillofacial, and CT C-Spine   Interventions:  20G PIV to L AC Trauma labs drawn CXR Pelvic XR Bilateral knee XR 1L LR bolus given Tdap given  Plan for disposition:  Other Unsure at this time.   Consults completed:  none at 1900.  Event Summary: Pt was BIB [facility] after sustaining injury due to a fall while attempting to carry groceries inside.  Pt struck her head on brick stairs and also struck her left leg and bilateral knees.  Pt had no LOC and denies neck pain.  Pt did receive tylenol  en route with EMS.  Bedside handoff with ED RN Jacqulyne Maxim.    Juan Noel  Trauma Response RN  Please call TRN at [phone] for further assistance.

## 2023-12-11 NOTE — ED Provider Notes (Signed)
 [place] EMERGENCY DEPARTMENT AT [facility] Provider Note   CSN: [id number] Arrival date & time: 12/11/23  1757     History  Chief Complaint  Patient presents with   Fall        HPI  Amber Barry is a 83 y.o. female with past medical history hypertension, CVA, CKD, hyperlipidemia presents for evaluation after fall.  Patient was bringing groceries into her house when she tripped and fell forward, hitting her head and also landing on the right side.  She did not lose consciousness.  She was unable to assist herself from the ground, had assistance after about 2 hours.  She is complaining of pain to her right knee.  She is on Plavix .  She received Tylenol  with EMS.   Fall       Home Medications Prior to Admission medications   Medication Sig Start Date End Date Taking? Authorizing Provider  acetaminophen  (TYLENOL ) 500 MG tablet Take 1,000 mg by mouth daily.    [provider]  ALPRAZolam  (XANAX ) 0.25 MG tablet Take 0.125 mg by mouth 2 (two) times daily as needed for anxiety.     [provider]  azelastine  (ASTELIN ) 0.1 % nasal spray Place 2 sprays into both nostrils 2 (two) times daily as needed for rhinitis or allergies. 11/15/14   [provider]  Calcium -Vitamin D  (CALTRATE 600 PLUS-VIT D PO) Take 1 tablet by mouth daily at 12 noon. Soft chews    [provider]  celecoxib (CELEBREX) 200 MG capsule Take 200 mg by mouth 2 (two) times a week.    [provider]  cholecalciferol  (VITAMIN D3) 25 MCG (1000 UNIT) tablet Take 1,000 Units by mouth daily at 12 noon.    [provider]  clopidogrel  (PLAVIX ) 75 MG tablet Take 1 tablet (75 mg total) by mouth daily. 08/05/23   Vita Grip, MD  escitalopram  (LEXAPRO ) 20 MG tablet Take 20 mg by mouth daily.    [provider]  hydrochlorothiazide  (HYDRODIURIL ) 25 MG tablet Take 25 mg by mouth daily.  11/15/14   [provider]  lisinopril (ZESTRIL) 10 MG  tablet Take 10 mg by mouth daily.    [provider]  omeprazole (PRILOSEC) 40 MG capsule Take 40 mg by mouth daily.    [provider]  Probiotic Product (ALIGN) 4 MG CAPS Take 4 mg by mouth daily.    [provider]  rosuvastatin  (CRESTOR ) 5 MG tablet Take 1 tablet (5 mg total) by mouth daily. 06/17/23 06/11/24  Hazle Lites, MD      Allergies    Benicar hct [olmesartan medoxomil-hctz], Buspar [buspirone], Norvasc [amlodipine besylate], Zoloft [sertraline hcl], Lipitor [atorvastatin ], Lisinopril, Motrin [ibuprofen], Amitriptyline hcl, and Pravastatin    Review of Systems   Review of Systems  Physical Exam Updated Vital Signs BP (!) 165/107   Pulse (!) 101   Temp 98.2 F (36.8 C) (Oral)   Resp 16   Ht 4\' 10"  (1.473 m)   Wt 56.2 kg   LMP 08/11/2002   SpO2 99%   BMI 25.92 kg/m  Physical Exam Vitals and nursing note reviewed.  Constitutional:      General: She is not in acute distress.    Appearance: She is well-developed.  HENT:     Head: Normocephalic.     Comments: Superficial laceration to the middle of the forehead, as well as over the nasal bridge, stable nose and midface on palpation    Right Ear:  External ear normal.     Left Ear: External ear normal.     Nose: Nose normal.     Mouth/Throat:     Mouth: Mucous membranes are moist.  Eyes:     Conjunctiva/sclera: Conjunctivae normal.  Cardiovascular:     Rate and Rhythm: Normal rate and regular rhythm.     Pulses:          Radial pulses are 2+ on the right side and 2+ on the left side.       Dorsalis pedis pulses are 2+ on the right side and 2+ on the left side.       Posterior tibial pulses are 2+ on the right side and 2+ on the left side.     Heart sounds: No murmur heard. Pulmonary:     Effort: Pulmonary effort is normal. No respiratory distress.     Breath sounds: Normal breath sounds.  Chest:     Comments: Clavicle stable, chest wall stable to anterior and lateral  compression Abdominal:     General: Abdomen is flat. There is no distension.     Palpations: Abdomen is soft.     Tenderness: There is no abdominal tenderness. There is no guarding or rebound.  Musculoskeletal:     Cervical back: Neck supple.     Right lower leg: No edema.     Left lower leg: No edema.     Comments: Pelvis stable to lateral compression Bruising to the bilateral knees with no swelling, intact hip flexion and extension bilaterally, intact knee flexion and extension bilaterally  Skin:    General: Skin is warm and dry.     Capillary Refill: Capillary refill takes less than 2 seconds.  Neurological:     Mental Status: She is alert.  Psychiatric:        Mood and Affect: Mood normal.     ED Results / Procedures / Treatments   Labs (all labs ordered are listed, but only abnormal results are displayed) Labs Reviewed  COMPREHENSIVE METABOLIC PANEL WITH GFR - Abnormal; Notable for the following components:      Result Value   Sodium 134 (*)    CO2 20 (*)    Glucose, Bld 144 (*)    BUN 29 (*)    Creatinine, Ser 1.28 (*)    GFR, Estimated 42 (*)    All other components within normal limits  CBC - Abnormal; Notable for the following components:   WBC 14.5 (*)    All other components within normal limits  I-STAT CHEM 8, ED - Abnormal; Notable for the following components:   Sodium 134 (*)    BUN 32 (*)    Creatinine, Ser 1.30 (*)    Glucose, Bld 143 (*)    Calcium , Ion 1.14 (*)    All other components within normal limits  CK TOTAL AND CKMB (NOT AT [facility])  ETHANOL  PROTIME-INR  I-STAT CG4 LACTIC ACID, ED  SAMPLE TO BLOOD BANK    EKG None  Radiology DG Femur Portable Min 2 Views Right Result Date: 12/11/2023 CLINICAL DATA:  Fall and right hip pain. EXAM: RIGHT FEMUR PORTABLE 2 VIEW COMPARISON:  None Available. FINDINGS: There is no acute fracture or dislocation. The bones are osteopenic. Severe arthritic changes of the right hip with bone-on-bone contact. The  soft tissues are unremarkable. IMPRESSION: 1. No acute fracture or dislocation. 2. Severe arthritic changes of the right hip. Electronically Signed   By: Marene Shape.D.  On: 12/11/2023 19:39   CT HEAD WO CONTRAST Result Date: 12/11/2023 CLINICAL DATA:  Trauma. EXAM: CT HEAD WITHOUT CONTRAST CT MAXILLOFACIAL WITHOUT CONTRAST CT CERVICAL SPINE WITHOUT CONTRAST TECHNIQUE: Multidetector CT imaging of the head, cervical spine, and maxillofacial structures were performed using the standard protocol without intravenous contrast. Multiplanar CT image reconstructions of the cervical spine and maxillofacial structures were also generated. RADIATION DOSE REDUCTION: This exam was performed according to the departmental dose-optimization program which includes automated exposure control, adjustment of the mA and/or kV according to patient size and/or use of iterative reconstruction technique. COMPARISON:  MRI head 11/02/2022. FINDINGS: CT HEAD FINDINGS Brain: No evidence of acute infarction, hemorrhage, hydrocephalus, extra-axial collection or mass lesion/mass effect. There is a small old infarct in the right corona radiata. Vascular: No hyperdense vessel or unexpected calcification. Skull: Normal. Negative for fracture or focal lesion. Other: There is soft tissue swelling laceration overlying the inferior frontal region. CT MAXILLOFACIAL FINDINGS Osseous: No fracture or mandibular dislocation. No destructive process. Orbits: Negative. No traumatic or inflammatory finding. Sinuses: Paranasal sinuses are clear. Right mastoid air cells are opacified with air-fluid levels. Soft tissues: There is soft tissue swelling over nose. No foreign body. CT CERVICAL SPINE FINDINGS Alignment: There is 2 mm of anterolisthesis at C4-C5. Alignment is otherwise anatomic. Skull base and vertebrae: No acute fracture. No primary bone lesion or focal pathologic process. Soft tissues and spinal canal: No prevertebral fluid or swelling. No  visible canal hematoma. Disc levels: There is disc space narrowing throughout the cervical spine. Osteophytes are seen throughout most significant at C5-C6 and C6-C7. Bilateral facet arthropathy present. There is moderate right neural foraminal stenosis at C3-C4 and C5-C6. No severe central canal stenosis at any level. Upper chest: Negative. Other: None IMPRESSION: 1. No acute intracranial process. 2. No acute facial bone fracture. 3. Soft tissue swelling over the nose. Swelling and laceration overlying the frontal region. 4. No acute fracture or traumatic subluxation of the cervical spine. 5. Multilevel degenerative changes of the cervical spine. 6. Right mastoid air cell opacification with air-fluid levels. Correlate for mastoiditis. Electronically Signed   By: Tyron Gallon M.D.   On: 12/11/2023 18:43   CT MAXILLOFACIAL WO CONTRAST Result Date: 12/11/2023 CLINICAL DATA:  Trauma. EXAM: CT HEAD WITHOUT CONTRAST CT MAXILLOFACIAL WITHOUT CONTRAST CT CERVICAL SPINE WITHOUT CONTRAST TECHNIQUE: Multidetector CT imaging of the head, cervical spine, and maxillofacial structures were performed using the standard protocol without intravenous contrast. Multiplanar CT image reconstructions of the cervical spine and maxillofacial structures were also generated. RADIATION DOSE REDUCTION: This exam was performed according to the departmental dose-optimization program which includes automated exposure control, adjustment of the mA and/or kV according to patient size and/or use of iterative reconstruction technique. COMPARISON:  MRI head 11/02/2022. FINDINGS: CT HEAD FINDINGS Brain: No evidence of acute infarction, hemorrhage, hydrocephalus, extra-axial collection or mass lesion/mass effect. There is a small old infarct in the right corona radiata. Vascular: No hyperdense vessel or unexpected calcification. Skull: Normal. Negative for fracture or focal lesion. Other: There is soft tissue swelling laceration overlying the  inferior frontal region. CT MAXILLOFACIAL FINDINGS Osseous: No fracture or mandibular dislocation. No destructive process. Orbits: Negative. No traumatic or inflammatory finding. Sinuses: Paranasal sinuses are clear. Right mastoid air cells are opacified with air-fluid levels. Soft tissues: There is soft tissue swelling over nose. No foreign body. CT CERVICAL SPINE FINDINGS Alignment: There is 2 mm of anterolisthesis at C4-C5. Alignment is otherwise anatomic. Skull base and vertebrae: No acute fracture. No  primary bone lesion or focal pathologic process. Soft tissues and spinal canal: No prevertebral fluid or swelling. No visible canal hematoma. Disc levels: There is disc space narrowing throughout the cervical spine. Osteophytes are seen throughout most significant at C5-C6 and C6-C7. Bilateral facet arthropathy present. There is moderate right neural foraminal stenosis at C3-C4 and C5-C6. No severe central canal stenosis at any level. Upper chest: Negative. Other: None IMPRESSION: 1. No acute intracranial process. 2. No acute facial bone fracture. 3. Soft tissue swelling over the nose. Swelling and laceration overlying the frontal region. 4. No acute fracture or traumatic subluxation of the cervical spine. 5. Multilevel degenerative changes of the cervical spine. 6. Right mastoid air cell opacification with air-fluid levels. Correlate for mastoiditis. Electronically Signed   By: Tyron Gallon M.D.   On: 12/11/2023 18:43   CT CERVICAL SPINE WO CONTRAST Result Date: 12/11/2023 CLINICAL DATA:  Trauma. EXAM: CT HEAD WITHOUT CONTRAST CT MAXILLOFACIAL WITHOUT CONTRAST CT CERVICAL SPINE WITHOUT CONTRAST TECHNIQUE: Multidetector CT imaging of the head, cervical spine, and maxillofacial structures were performed using the standard protocol without intravenous contrast. Multiplanar CT image reconstructions of the cervical spine and maxillofacial structures were also generated. RADIATION DOSE REDUCTION: This exam was  performed according to the departmental dose-optimization program which includes automated exposure control, adjustment of the mA and/or kV according to patient size and/or use of iterative reconstruction technique. COMPARISON:  MRI head 11/02/2022. FINDINGS: CT HEAD FINDINGS Brain: No evidence of acute infarction, hemorrhage, hydrocephalus, extra-axial collection or mass lesion/mass effect. There is a small old infarct in the right corona radiata. Vascular: No hyperdense vessel or unexpected calcification. Skull: Normal. Negative for fracture or focal lesion. Other: There is soft tissue swelling laceration overlying the inferior frontal region. CT MAXILLOFACIAL FINDINGS Osseous: No fracture or mandibular dislocation. No destructive process. Orbits: Negative. No traumatic or inflammatory finding. Sinuses: Paranasal sinuses are clear. Right mastoid air cells are opacified with air-fluid levels. Soft tissues: There is soft tissue swelling over nose. No foreign body. CT CERVICAL SPINE FINDINGS Alignment: There is 2 mm of anterolisthesis at C4-C5. Alignment is otherwise anatomic. Skull base and vertebrae: No acute fracture. No primary bone lesion or focal pathologic process. Soft tissues and spinal canal: No prevertebral fluid or swelling. No visible canal hematoma. Disc levels: There is disc space narrowing throughout the cervical spine. Osteophytes are seen throughout most significant at C5-C6 and C6-C7. Bilateral facet arthropathy present. There is moderate right neural foraminal stenosis at C3-C4 and C5-C6. No severe central canal stenosis at any level. Upper chest: Negative. Other: None IMPRESSION: 1. No acute intracranial process. 2. No acute facial bone fracture. 3. Soft tissue swelling over the nose. Swelling and laceration overlying the frontal region. 4. No acute fracture or traumatic subluxation of the cervical spine. 5. Multilevel degenerative changes of the cervical spine. 6. Right mastoid air cell  opacification with air-fluid levels. Correlate for mastoiditis. Electronically Signed   By: Tyron Gallon M.D.   On: 12/11/2023 18:43   DG Pelvis Portable Result Date: 12/11/2023 CLINICAL DATA:  Trauma EXAM: PORTABLE PELVIS 1-2 VIEWS COMPARISON:  None Available. FINDINGS: Degenerative changes in the hips bilaterally, right greater than left with joint space narrowing and spurring. No acute intracranial abnormality. Specifically, no hemorrhage, hydrocephalus, mass lesion, acute infarction, or significant intracranial injury. IMPRESSION: No acute intracranial abnormality. Bilateral hip osteoarthritis, right greater than left. Electronically Signed   By: Janeece Mechanic M.D.   On: 12/11/2023 18:27   DG Knee Right Port Result Date:  12/11/2023 CLINICAL DATA:  Blunt Trauma.  Pain. EXAM: PORTABLE RIGHT KNEE - 1-2 VIEW COMPARISON:  None Available. FINDINGS: Spurring in the patellofemoral compartment. Joint spaces maintained. Small joint effusion. No acute bony abnormality. Specifically, no fracture, subluxation, or dislocation. IMPRESSION: Early degenerative changes in the patellofemoral compartment. Small joint effusion. No acute bony abnormality. Electronically Signed   By: Janeece Mechanic M.D.   On: 12/11/2023 18:26   DG Knee Left Port Result Date: 12/11/2023 CLINICAL DATA:  Blunt trauma, pain EXAM: PORTABLE LEFT KNEE - 1-2 VIEW COMPARISON:  None Available. FINDINGS: Spurring in the patellofemoral compartment. Joint spaces maintained. No acute bony abnormality. Specifically, no fracture, subluxation, or dislocation. No joint effusion. IMPRESSION: No acute bony abnormality. Electronically Signed   By: Janeece Mechanic M.D.   On: 12/11/2023 18:26   DG Chest Port 1 View Result Date: 12/11/2023 CLINICAL DATA:  Trauma EXAM: PORTABLE CHEST 1 VIEW COMPARISON:  Chest x-ray 12/10/2012 FINDINGS: The heart size and mediastinal contours are within normal limits. Both lungs are clear. The visualized skeletal structures are  unremarkable. IMPRESSION: No active disease. Electronically Signed   By: Tyron Gallon M.D.   On: 12/11/2023 18:26    Procedures Procedures    Medications Ordered in ED Medications  oxyCODONE  (Oxy IR/ROXICODONE ) immediate release tablet 2.5 mg (has no administration in time range)  acetaminophen  (TYLENOL ) tablet 325 mg (has no administration in time range)  Tdap (BOOSTRIX ) injection 0.5 mL (0.5 mLs Intramuscular Given 12/11/23 1930)  lactated ringers  bolus 1,000 mL (1,000 mLs Intravenous New Bag/Given 12/11/23 1929)    ED Course/ Medical Decision Making/ A&P                                 Medical Decision Making Amount and/or Complexity of Data Reviewed Labs: ordered. Radiology: ordered.  Risk OTC drugs. Prescription drug management.   Patient is alert, afebrile, and hemodynamically stable in no acute distress.  Physical exam as noted above.  Differential includes facial fracture, ICH, calvarial fracture, C-spine injury.  Will also obtain x-ray imaging of the hip and bilateral knees.  Obtaining labs, including CK given prolonged downtime.  I personally interpreted patient's x-ray imaging.  Chest x-ray with no pneumothorax or obvious rib fractures, pelvic x-ray with no pelvic ring fractures or hip dislocations.  Right knee with small joint effusion with degenerative changes, no bony fractures or dislocations.  Left knee x-ray with no fractures or dislocations.  CT imaging resulted with no acute intracranial findings, no acute C-spine findings, and no maxillofacial bony injuries.  Labs resulted with CBC demonstrating leukocytosis of 14.5, CMP with no severe electrolyte derangements, creatinine at baseline.  EtOH undetectable.  CK 88.  Lactate WNL.    Patient was complaining of right hip pain, so focused x-ray imaging of the right hip was obtained and did not demonstrate any acute fractures or malalignments.  While pending workup, patient was able to ambulate to the bathroom with  minimal assistance.  I also performed cleaned out of her forehead and nose wounds, and repair.  Of note, patient was ordered morphine  for pain control, which she politely declined and requested different medication.  She was then ordered Tylenol  and 2.5 oxycodone .  I had considered Toradol , but patient has history of CKD.  I spoke with patient and her daughter at bedside about reassuring workup today.  We discussed following up with PCP outpatient, and pain management at home with Tylenol .  Daughter  will be staying with the patient over the next day to ensure she is safe at home.  Patient states that she has a walker and cane at home that she can use for any assistance with ambulation.  We discussed strict return precautions to the ED.  Patient was discharged in stable condition.        Final Clinical Impression(s) / ED Diagnoses Final diagnoses:  Fall, initial encounter    Rx / DC Orders ED Discharge Orders     None         Lorain Robson, MD 12/11/23 2024    Dalene Duck, MD 12/15/23 [id number]

## 2023-12-11 NOTE — ED Triage Notes (Signed)
 Pt presents to ED via EMS from home. Pt reports bringing groceries in on her side porch when she tripped and fell then hit her face and the right side of her body. Reporting pain in mainly the right knee and some into the right hip area. GCS 15.

## 2023-12-11 NOTE — ED Notes (Signed)
 PT walked to bathroom without pain and used the bathroom under own power and states she feels good but is a little weak but states this is her normal.

## 2023-12-17 DIAGNOSIS — J988 Other specified respiratory disorders: Secondary | ICD-10-CM | POA: Diagnosis not present

## 2023-12-17 DIAGNOSIS — Z09 Encounter for follow-up examination after completed treatment for conditions other than malignant neoplasm: Secondary | ICD-10-CM | POA: Diagnosis not present

## 2023-12-28 DIAGNOSIS — J069 Acute upper respiratory infection, unspecified: Secondary | ICD-10-CM | POA: Diagnosis not present

## 2024-02-01 DIAGNOSIS — M25511 Pain in right shoulder: Secondary | ICD-10-CM | POA: Diagnosis not present

## 2024-02-01 DIAGNOSIS — M1611 Unilateral primary osteoarthritis, right hip: Secondary | ICD-10-CM | POA: Diagnosis not present

## 2024-02-08 DIAGNOSIS — M1611 Unilateral primary osteoarthritis, right hip: Secondary | ICD-10-CM | POA: Diagnosis not present

## 2024-02-09 DIAGNOSIS — R7303 Prediabetes: Secondary | ICD-10-CM | POA: Diagnosis not present

## 2024-02-09 DIAGNOSIS — N183 Chronic kidney disease, stage 3 unspecified: Secondary | ICD-10-CM | POA: Diagnosis not present

## 2024-02-09 DIAGNOSIS — I10 Essential (primary) hypertension: Secondary | ICD-10-CM | POA: Diagnosis not present

## 2024-02-09 DIAGNOSIS — I639 Cerebral infarction, unspecified: Secondary | ICD-10-CM | POA: Diagnosis not present

## 2024-02-09 DIAGNOSIS — I129 Hypertensive chronic kidney disease with stage 1 through stage 4 chronic kidney disease, or unspecified chronic kidney disease: Secondary | ICD-10-CM | POA: Diagnosis not present

## 2024-02-09 DIAGNOSIS — F331 Major depressive disorder, recurrent, moderate: Secondary | ICD-10-CM | POA: Diagnosis not present

## 2024-02-09 DIAGNOSIS — M1990 Unspecified osteoarthritis, unspecified site: Secondary | ICD-10-CM | POA: Diagnosis not present

## 2024-02-09 DIAGNOSIS — E782 Mixed hyperlipidemia: Secondary | ICD-10-CM | POA: Diagnosis not present

## 2024-02-25 DIAGNOSIS — S46011D Strain of muscle(s) and tendon(s) of the rotator cuff of right shoulder, subsequent encounter: Secondary | ICD-10-CM | POA: Diagnosis not present

## 2024-02-25 DIAGNOSIS — M6281 Muscle weakness (generalized): Secondary | ICD-10-CM | POA: Diagnosis not present

## 2024-02-25 DIAGNOSIS — M25611 Stiffness of right shoulder, not elsewhere classified: Secondary | ICD-10-CM | POA: Diagnosis not present

## 2024-03-01 DIAGNOSIS — Z961 Presence of intraocular lens: Secondary | ICD-10-CM | POA: Diagnosis not present

## 2024-03-01 DIAGNOSIS — H25012 Cortical age-related cataract, left eye: Secondary | ICD-10-CM | POA: Diagnosis not present

## 2024-03-01 DIAGNOSIS — H52202 Unspecified astigmatism, left eye: Secondary | ICD-10-CM | POA: Diagnosis not present

## 2024-03-01 DIAGNOSIS — H25812 Combined forms of age-related cataract, left eye: Secondary | ICD-10-CM | POA: Diagnosis not present

## 2024-03-01 DIAGNOSIS — H2512 Age-related nuclear cataract, left eye: Secondary | ICD-10-CM | POA: Diagnosis not present

## 2024-03-15 DIAGNOSIS — H25011 Cortical age-related cataract, right eye: Secondary | ICD-10-CM | POA: Diagnosis not present

## 2024-03-15 DIAGNOSIS — H2511 Age-related nuclear cataract, right eye: Secondary | ICD-10-CM | POA: Diagnosis not present

## 2024-03-15 DIAGNOSIS — Z961 Presence of intraocular lens: Secondary | ICD-10-CM | POA: Diagnosis not present

## 2024-03-15 DIAGNOSIS — H2189 Other specified disorders of iris and ciliary body: Secondary | ICD-10-CM | POA: Diagnosis not present

## 2024-03-15 DIAGNOSIS — H25811 Combined forms of age-related cataract, right eye: Secondary | ICD-10-CM | POA: Diagnosis not present

## 2024-04-18 ENCOUNTER — Other Ambulatory Visit: Payer: Self-pay | Admitting: *Deleted

## 2024-04-18 DIAGNOSIS — E785 Hyperlipidemia, unspecified: Secondary | ICD-10-CM

## 2024-04-22 ENCOUNTER — Other Ambulatory Visit: Payer: Self-pay | Admitting: Family Medicine

## 2024-04-22 DIAGNOSIS — Z1231 Encounter for screening mammogram for malignant neoplasm of breast: Secondary | ICD-10-CM

## 2024-05-05 ENCOUNTER — Ambulatory Visit
Admission: RE | Admit: 2024-05-05 | Discharge: 2024-05-05 | Disposition: A | Discharge status: Home / Self Care | Source: Ambulatory Visit

## 2024-05-05 DIAGNOSIS — Z1231 Encounter for screening mammogram for malignant neoplasm of breast: Secondary | ICD-10-CM

## 2024-06-04 LAB — NMR, LIPOPROFILE
Cholesterol, Total: 116 mg/dL (ref 100–199)
HDL Particle Number: 28.4 umol/L — ABNORMAL LOW (ref >=30.5)
HDL-C: 53 mg/dL (ref >39)
LDL Particle Number: 500 nmol/L (ref <1000)
LDL Size: 20.3 nm — ABNORMAL LOW (ref >20.5)
LDL-C (NIH Calc): 50 mg/dL (ref 0–99)
LP-IR Score: 39 (ref <=45)
Small LDL Particle Number: 327 nmol/L (ref <=527)
Triglycerides: 55 mg/dL (ref 0–149)

## 2024-06-05 ENCOUNTER — Ambulatory Visit: Payer: Self-pay | Admitting: Internal Medicine

## 2024-06-06 DIAGNOSIS — M25551 Pain in right hip: Secondary | ICD-10-CM | POA: Diagnosis not present

## 2024-06-06 DIAGNOSIS — M1611 Unilateral primary osteoarthritis, right hip: Secondary | ICD-10-CM | POA: Diagnosis not present

## 2024-06-14 ENCOUNTER — Encounter: Payer: Self-pay | Admitting: Internal Medicine

## 2024-06-14 ENCOUNTER — Ambulatory Visit: Attending: Internal Medicine | Admitting: Internal Medicine

## 2024-06-14 VITALS — BP 190/88 | HR 77 | Ht <= 58 in | Wt 119.0 lb

## 2024-06-14 DIAGNOSIS — I10 Essential (primary) hypertension: Secondary | ICD-10-CM | POA: Insufficient documentation

## 2024-06-14 DIAGNOSIS — E785 Hyperlipidemia, unspecified: Secondary | ICD-10-CM | POA: Diagnosis not present

## 2024-06-14 DIAGNOSIS — Z8673 Personal history of transient ischemic attack (TIA), and cerebral infarction without residual deficits: Secondary | ICD-10-CM | POA: Diagnosis not present

## 2024-06-14 DIAGNOSIS — I6521 Occlusion and stenosis of right carotid artery: Secondary | ICD-10-CM | POA: Insufficient documentation

## 2024-06-14 MED ORDER — VALSARTAN 160 MG PO TABS: 160.0000 mg | ORAL_TABLET | Freq: Every day | ORAL | 1 refills | Status: DC

## 2024-06-14 NOTE — Patient Instructions (Signed)
 Medication Instructions:  STOP lisinopril   START valsartan 160mg  once daily for BP  *If you need a refill on your cardiac medications before your next appointment, please call your pharmacy*  Lab Work: Non-Fasting BMET in 1 week   If you have labs (blood work) drawn today and your tests are completely normal, you will receive your results only by: MyChart Message (if you have MyChart) OR A paper copy in the mail If you have any lab test that is abnormal or we need to change your treatment, we will call you to review the results.  Testing/Procedures: Check BP at home 1-2x daily for 7-10 days Please call or send readings via MyChart Please check BP 1-2 hour after your valsartan dose and then pick another consistent time during the day.  Follow-Up: At [facility], you and your health needs are our priority.  As part of our continuing mission to provide you with exceptional heart care, our providers are all part of one team.  This team includes your primary Cardiologist (physician) and Advanced Practice Providers or APPs (Physician Assistants and Nurse Practitioners) who all work together to provide you with the care you need, when you need it.  Your next appointment:    1 month with PA or NP for BP follow up  We recommend signing up for the patient portal called MyChart.  Sign up information is provided on this After Visit Summary.  MyChart is used to connect with patients for Virtual Visits (Telemedicine).  Patients are able to view lab/test results, encounter notes, upcoming appointments, etc.  Non-urgent messages can be sent to your provider as well.   To learn more about what you can do with MyChart, go to [email or web address].   Other Instructions Tips to Measure your Blood Pressure Correctly  Here's what you can do to ensure a correct reading:  Don't drink a caffeinated beverage or smoke during the 30 minutes before the test.  Sit quietly for five minutes before  the test begins.  During the measurement, sit in a chair with your feet on the floor and your arm supported so your elbow is at about heart level.  The inflatable part of the cuff should completely cover at least 80% of your upper arm, and the cuff should be placed on bare skin, not over a shirt.  Don't talk during the measurement.  Have your blood pressure measured twice, with a brief break in between. If the readings are different by 5 points or more, have it done a third time.    Blood Pressure Log   Date   Time  Blood Pressure  Position  Example: Nov 1 9 AM 124/78 sitting

## 2024-06-14 NOTE — Progress Notes (Signed)
 LIPID CLINIC CONSULT NOTE  Chief Complaint:  Follow-up  Primary Care Physician: Auston Opal, DO  Primary Cardiologist:  None  HPI:  Amber Barry is a 83 y.o. female who is being seen today for the evaluation of anemia at the request of Auston Opal, DO.  This is a pleasant 83 year old female kindly referred for evaluation and management of dyslipidemia.  She has a history of carotid artery disease with prior right carotid intervention by Dr. Gerlean in the past.  She is currently followed by Dr. Sheree and recently has had some worsening stenosis of the right carotid.  She was in the severe range based on Doppler velocities back in August however plan was to repeat Dopplers in 6 months per her request.  Unfortunately recently she had a spike in blood pressure as well as lightheadedness and presented to the emergency department a few days ago for evaluation.  She underwent CT angiography of the neck which showed about 80% right carotid artery stenosis and then ultimately had an MRI of the head which showed no evidence of acute stroke.  Etiology of her elevated blood pressure was not clear.  She had no chest pain or shortness of breath.  She says she has some anxiety.  She has not been on any lipid-lowering therapy for quite some time after having failed atorvastatin  and pravastatin in the past.  Her last lipid profile in December showed total cholesterol 138, triglycerides 81, HDL 40 and LDL 82.  12/02/2022  Amber Barry returns today for follow-up of dyslipidemia.  She has done well on low-dose rosuvastatin .  She seems to be tolerating this well.  Her LDL particle number was 389, LDL-C 46, HDL 54 and triglycerides 36.  LP(a) was tested and negative at 14.2 nmol/L.  She tells me today that her vascular surgeon is considering a second operation on her carotid artery because there is now 90% stenosis.  She has some concerns about the procedure due to the increased risk of stroke.  An EKG was  performed today as part of a preoperative workup which shows normal sinus rhythm and a right bundle branch block.  This is a new finding since 2023 comparator but does not necessarily indicate any ischemia.  She denies any anginal symptoms.  06/14/2024  Amber Barry is seen today for follow-up.  Of note she had an acute stroke last December.  She had right internal carotid artery stenosis status post carotid endarterectomy which was repeated.  Blood pressure was very high today at 190/88.  She does not check it regularly at home.  Repeat blood pressure came down to 180 systolic.  She is only on low-dose lisinopril 10 mg and HCTZ 25 mg daily.  Lipids were just reassessed.  Overall this looks good with an LDL particle #500, LDL 50, HDL 53 and triglycerides 55.  She denies any chest pain or shortness of breath.  PMHx:   Past Medical History:  Diagnosis Date   Anemia    pt denies   Anxiety    Arthritis    Osteoarthritis   Bursitis    left hip-had injection 2015   Carotid artery occlusion 11/2012   Left Bruit, 80% blockage   CKD (chronic kidney disease)    Stage III   Diverticulitis    Hematuria    Hypertension    Interstitial cystitis    Lower back pain 01/2014   Ocular migraine    Osteopenia    PONV (postoperative nausea and vomiting)  Past Surgical History:  Procedure Laterality Date   ENDARTERECTOMY Right 12/22/2012   Procedure: Right Carotid Endarterectomy with hemashield patch angioplasty;  Surgeon: Lynwood JONETTA Collum, MD;  Location: [facility] OR;  Service: Vascular;  Laterality: Right;   ENDARTERECTOMY Right 01/13/2023   Procedure: RIGHT ENDARTERECTOMY COMMON CAROTID;  Surgeon: Sheree Penne Bruckner, MD;  Location: [facility] OR;  Service: Vascular;  Laterality: Right;   PATCH ANGIOPLASTY Right 01/13/2023   Procedure: PATCH ANGIOPLASTY USING HEMASHIELD PLATINUM PATCH 0.8CM X 7.6CM;  Surgeon: Sheree Penne Bruckner, MD;  Location: [facility] OR;  Service: Vascular;  Laterality: Right;   TONSILLECTOMY   1948    FAMHx:  Family History  Problem Relation Age of Onset   COPD Mother        Respiratory Disease   Rheumatic fever Mother    Pulmonary disease Mother    Heart attack Mother    Stroke Father    Hypertension Father    Diabetes Paternal Aunt    Hypertension Paternal Aunt    Other Maternal Grandmother        hardening arteries   Prostate cancer Maternal Grandfather    Hypertension Paternal Grandmother    Stroke Other        paternal side   Breast cancer Neg Hx     SOCHx:   reports that she has never smoked. She has never been exposed to tobacco smoke. She has never used smokeless tobacco. She reports that she does not drink alcohol and does not use drugs.  ALLERGIES:  Allergies  Allergen Reactions   Buspar [Buspirone] Other (See Comments)    Facial Dysesthesia   Norvasc [Amlodipine Besylate] Swelling    legs   Zoloft [Sertraline Hcl] Anxiety    Increased anxiety, dysesthesia   Lipitor [Atorvastatin ] Other (See Comments)    Generalized muscle aches   Motrin [Ibuprofen] Other (See Comments)     Contraindicated with Celebrex. Pt states MD said to not take together.   Amitriptyline Hcl Anxiety    And Paresthesias   Pravastatin Other (See Comments)    Pt. Wanted to sleep, no energy.    ROS: Pertinent items noted in HPI and remainder of comprehensive ROS otherwise negative.  HOME MEDS: Current Outpatient Medications on File Prior to Visit  Medication Sig Dispense Refill   acetaminophen  (TYLENOL ) 500 MG tablet Take 1,000 mg by mouth daily.     ALPRAZolam  (XANAX ) 0.25 MG tablet Take 0.125 mg by mouth 2 (two) times daily as needed for anxiety.      azelastine  (ASTELIN ) 0.1 % nasal spray Place 2 sprays into both nostrils 2 (two) times daily as needed for rhinitis or allergies.  12   Calcium -Vitamin D  (CALTRATE 600 PLUS-VIT D PO) Take 1 tablet by mouth daily at 12 noon. Soft chews     celecoxib (CELEBREX) 200 MG capsule Take 200 mg by mouth 2 (two) times a week.      cholecalciferol  (VITAMIN D3) 25 MCG (1000 UNIT) tablet Take 1,000 Units by mouth daily at 12 noon.     clopidogrel  (PLAVIX ) 75 MG tablet Take 1 tablet (75 mg total) by mouth daily. 30 tablet 11   escitalopram  (LEXAPRO ) 20 MG tablet Take 20 mg by mouth daily.     hydrochlorothiazide  (HYDRODIURIL ) 25 MG tablet Take 25 mg by mouth daily.   3   omeprazole (PRILOSEC) 40 MG capsule Take 40 mg by mouth daily.     Probiotic Product (ALIGN) 4 MG CAPS Take 4 mg by mouth daily.  rosuvastatin  (CRESTOR ) 5 MG tablet Take 1 tablet (5 mg total) by mouth daily. 90 tablet 3   No current facility-administered medications on file prior to visit.    LABS/IMAGING: No results found for this or any previous visit (from the past 48 hours). No results found.  LIPID PANEL:    Component Value Date/Time   CHOL 88 08/04/2023 0444   TRIG 19 08/04/2023 0444   HDL 41 08/04/2023 0444   CHOLHDL 2.1 08/04/2023 0444   VLDL 4 08/04/2023 0444   LDLCALC 43 08/04/2023 0444    WEIGHTS: Wt Readings from Last 3 Encounters:  06/14/24 119 lb (54 kg)  12/11/23 124 lb (56.2 kg)  11/04/23 121 lb 6.4 oz (55.1 kg)    VITALS: BP (!) 190/88 (BP Location: Right Arm, Patient Position: Sitting, Cuff Size: Normal)   Pulse 77   Ht 4' 10 (1.473 m)   Wt 119 lb (54 kg)   LMP 08/11/2002   SpO2 98%   BMI 24.87 kg/m   EXAM: General appearance: alert and no distress Neck: no carotid bruit, no JVD, and thyroid  not enlarged, symmetric, no tenderness/mass/nodules Lungs: clear to auscultation bilaterally Heart: regular rate and rhythm, S1, S2 normal, no murmur, click, rub or gallop Abdomen: soft, non-tender; bowel sounds normal; no masses,  no organomegaly Extremities: extremities normal, atraumatic, no cyanosis or edema Pulses: 2+ and symmetric Skin: Skin color, texture, turgor normal. No rashes or lesions Neurologic: Grossly normal Psych: Pleasant  EKG: Deferred  ASSESSMENT: Uncontrolled hypertension Stroke -  07/2023 Mixed dyslipidemia, goal LDL less than 70 History of statin intolerance-myalgias, fatigue Severe right internal carotid artery stenosis with prior right carotid endarterectomy x 2 RBBB  PLAN: 1.   Ms. Elliott seems to be doing well although appears to have uncontrolled hypertension.  Blood pressure was 190/88 today with a repeat of 180 systolic.  She does not regularly check her home blood pressure readings.  Recently in the doctor's office she was 142 which is still too high for someone whose had a prior stroke.  I would advise adjusting her medications.  Will stop lisinopril and switch her to valsartan 160 mg daily.  Continue HCTZ 25 mg daily.  We may need to add amlodipine.  Will schedule follow-up with APP in 1 month and have encouraged her to take home blood pressure readings and reach out to me with those in 1 to 2 weeks.  Will also repeat a BMET in 1 week after starting her ARB  Vinie KYM Maxcy, MD, [facility], FNLA, FACP  [place]  [facility]  Medical Director of the Advanced Lipid Disorders &  Cardiovascular Risk Reduction Clinic Diplomate of the American Board of Clinical Lipidology Attending Cardiologist  Direct Dial: [phone]  Fax: [phone]  Website:  [email or web address] Vinie BROCKS Braxtin Bamba 06/14/2024, 9:12 AM

## 2024-06-18 DIAGNOSIS — Z23 Encounter for immunization: Secondary | ICD-10-CM | POA: Diagnosis not present

## 2024-06-20 DIAGNOSIS — M25552 Pain in left hip: Secondary | ICD-10-CM | POA: Diagnosis not present

## 2024-06-20 DIAGNOSIS — M160 Bilateral primary osteoarthritis of hip: Secondary | ICD-10-CM | POA: Diagnosis not present

## 2024-06-23 DIAGNOSIS — I10 Essential (primary) hypertension: Secondary | ICD-10-CM | POA: Diagnosis not present

## 2024-06-24 ENCOUNTER — Encounter: Payer: Self-pay | Admitting: Internal Medicine

## 2024-06-24 DIAGNOSIS — I10 Essential (primary) hypertension: Secondary | ICD-10-CM

## 2024-06-24 LAB — BASIC METABOLIC PANEL WITH GFR
BUN/Creatinine Ratio: 28 (ref 12–28)
BUN: 31 mg/dL — ABNORMAL HIGH (ref 8–27)
CO2: 21 mmol/L (ref 20–29)
Calcium: 9.8 mg/dL (ref 8.7–10.3)
Chloride: 98 mmol/L (ref 96–106)
Creatinine, Ser: 1.12 mg/dL — ABNORMAL HIGH (ref 0.57–1.00)
Glucose: 81 mg/dL (ref 70–99)
Potassium: 4.6 mmol/L (ref 3.5–5.2)
Sodium: 133 mmol/L — ABNORMAL LOW (ref 134–144)
eGFR: 48 mL/min/1.73 — ABNORMAL LOW (ref >59)

## 2024-06-27 ENCOUNTER — Ambulatory Visit: Payer: Self-pay | Admitting: Internal Medicine

## 2024-07-01 MED ORDER — AMLODIPINE BESYLATE 2.5 MG PO TABS: 2.5000 mg | ORAL_TABLET | Freq: Every day | ORAL | 3 refills | Status: DC

## 2024-07-01 NOTE — Addendum Note (Signed)
 Addended by: LORING ANDRIETTE HERO on: 07/01/2024 08:30 AM   Modules accepted: Orders

## 2024-07-13 NOTE — Progress Notes (Unsigned)
 Cardiology Office Note   Date:  07/12/2024  ID:  Amber Barry [birth year] [id number]  PCP: Auston Opal, DO  [facility] Providers Cardiologist: Vinie JAYSON Maxcy, MD     Chief Complaint: Amber Barry presents to the clinic for follow-up.    Amber Barry established care with Dr. Maxcy for lipid management 08/2023 after suffering an acute CVA 07/2023. She noted several medication intolerances in the past including atorvastatin  and pravastatin. She was started on rosuvastatin  5 mg daily, for which she has tolerated well.   She was seen in the office 06/14/24. BP was 190/88 on lisinopril 10 mg daily and hydrochlorothiazide  25 mg daily. Was not checking BP at home, asymptomatic. Lisinopril was switched to valsartan  160 mg daily and hydrochlorothiazide  25 mg was continued. She was scheduled for one month follow-up. BMP 06/23/24 with stable kidney function.  MyChart message sent 07/01/24 with BPs 140s-160s at home. After discussion between Dr. Maxcy and patient, it was noted that she had swelling on amlodipine  10 mg in the past, so she agreed to start 2.5 mg daily.     History of Present Illness: Amber Barry is a 83 y.o.female with PMH of hypertension, hyperlipidemia, carotid artery disease s/p right carotid enterectomy in 2014 and 2024, acute CVA who presents to the clinic for follow-up. She is doing well, daughter notes she feels that her mother is much clearer. Checking BP at home, 130s-150s/60s-70s since starting amlodipine . One recorded BP on 07/11/24 of 117/63, denies dizziness or lightheadedness at that time. Taking medications as prescribed. Denies vision changes, headaches, chest pain, shortness of breath, palpitations, and edema.   ROS: Please see the history of present illness. All other systems reviewed and are negative.   Studies Reviewed: The following studies were personally reviewed today: Cardiac Studies & Procedures    ______________________________________________________________________________________________     ECHOCARDIOGRAM  ECHOCARDIOGRAM COMPLETE 08/04/2023  Narrative ECHOCARDIOGRAM REPORT    Patient Name:   Amber Barry Date of Exam: 08/04/2023 Medical Rec #:  [id number]      Height:       58.0 in Accession #:    [id number]     Weight:       124.0 lb Date of Birth:  [birth year]      BSA:          1.486 m Patient Age:    82 years       BP:           110/79 mmHg Patient Gender: F              HR:           82 bpm. Exam Location:  Inpatient  Procedure: 2D Echo, Cardiac Doppler, Color Doppler and Intracardiac Opacification Agent  Indications:    Stroke  History:        Patient has no prior history of Echocardiogram examinations. Risk Factors:Hypertension.  Sonographer:    Ozell Free Referring Phys: [id number] RATHORE   Sonographer Comments: Technically challenging study due to limited acoustic windows. Image acquisition challenging due to respiratory motion. IMPRESSIONS   1. Left ventricular ejection fraction, by estimation, is 55 to 60%. The left ventricle has normal function. The left ventricle has no regional wall motion abnormalities. Left ventricular diastolic parameters are consistent with Grade I diastolic dysfunction (impaired relaxation). 2. Right ventricular systolic function is normal. The right ventricular size is normal. There is mildly elevated pulmonary artery systolic pressure. The estimated right ventricular systolic pressure is  44.5 mmHg. 3. The mitral valve is normal in structure. Trivial mitral valve regurgitation. 4. Color evaluation of TR technically difficult. 5. The aortic valve was not well visualized. Aortic valve regurgitation is not visualized. 6. The inferior vena cava is normal in size with greater than 50% respiratory variability, suggesting right atrial pressure of 3 mmHg.  Comparison(s): No prior Echocardiogram.  FINDINGS Left  Ventricle: Left ventricular ejection fraction, by estimation, is 55 to 60%. The left ventricle has normal function. The left ventricle has no regional wall motion abnormalities. Definity  contrast agent was given IV to delineate the left ventricular endocardial borders. The left ventricular internal cavity size was normal in size. There is no left ventricular hypertrophy. Left ventricular diastolic parameters are consistent with Grade I diastolic dysfunction (impaired relaxation).  Right Ventricle: The right ventricular size is normal. No increase in right ventricular wall thickness. Right ventricular systolic function is normal. There is mildly elevated pulmonary artery systolic pressure. The tricuspid regurgitant velocity is 3.22 m/s, and with an assumed right atrial pressure of 3 mmHg, the estimated right ventricular systolic pressure is 44.5 mmHg.  Left Atrium: Left atrial size was normal in size.  Right Atrium: Right atrial size was normal in size.  Pericardium: There is no evidence of pericardial effusion. Presence of epicardial fat layer.  Mitral Valve: The mitral valve is normal in structure. Trivial mitral valve regurgitation.  Tricuspid Valve: Color evaluation of TR technically difficult. The tricuspid valve is not well visualized. Tricuspid valve regurgitation is mild.  Aortic Valve: The aortic valve was not well visualized. Aortic valve regurgitation is not visualized. Aortic valve mean gradient measures 7.0 mmHg. Aortic valve peak gradient measures 13.0 mmHg. Aortic valve area, by VTI measures 1.87 cm.  Pulmonic Valve: The pulmonic valve was normal in structure. Pulmonic valve regurgitation is not visualized.  Aorta: The ascending aorta was not well visualized and the aortic root is normal in size and structure.  Venous: The inferior vena cava is normal in size with greater than 50% respiratory variability, suggesting right atrial pressure of 3 mmHg.  IAS/Shunts: No atrial level  shunt detected by color flow Doppler.   LEFT VENTRICLE PLAX 2D LVIDd:         4.10 cm     Diastology LVIDs:         3.00 cm     LV e' medial:    5.28 cm/s LV PW:         1.00 cm     LV E/e' medial:  18.2 LV IVS:        1.00 cm     LV e' lateral:   10.40 cm/s LVOT diam:     2.00 cm     LV E/e' lateral: 9.2 LV SV:         65 LV SV Index:   44 LVOT Area:     3.14 cm  LV Volumes (MOD) LV vol d, MOD A4C: 61.8 ml LV vol s, MOD A4C: 28.5 ml LV SV MOD A4C:     61.8 ml  RIGHT VENTRICLE             IVC RV Basal diam:  3.60 cm     IVC diam: 1.40 cm RV S prime:     10.40 cm/s TAPSE (M-mode): 2.3 cm  LEFT ATRIUM             Index        RIGHT ATRIUM  Index LA diam:        2.90 cm 1.95 cm/m   RA Area:     14.00 cm LA Vol (A2C):   67.0 ml 45.08 ml/m  RA Volume:   32.80 ml  22.07 ml/m LA Vol (A4C):   36.9 ml 24.83 ml/m LA Biplane Vol: 50.1 ml 33.71 ml/m AORTIC VALVE AV Area (Vmax):    1.73 cm AV Area (Vmean):   1.65 cm AV Area (VTI):     1.87 cm AV Vmax:           180.00 cm/s AV Vmean:          127.000 cm/s AV VTI:            0.347 m AV Peak Grad:      13.0 mmHg AV Mean Grad:      7.0 mmHg LVOT Vmax:         99.20 cm/s LVOT Vmean:        66.800 cm/s LVOT VTI:          0.207 m LVOT/AV VTI ratio: 0.60  AORTA Ao Root diam: 3.20 cm  MITRAL VALVE                TRICUSPID VALVE MV Area (PHT): 4.06 cm     TR Peak grad:   41.5 mmHg MV Decel Time: 187 msec     TR Vmax:        322.00 cm/s MV E velocity: 96.00 cm/s MV A velocity: 129.00 cm/s  SHUNTS MV E/A ratio:  0.74         Systemic VTI:  0.21 m Systemic Diam: 2.00 cm  Stanly Leavens MD Electronically signed by Stanly Leavens MD Signature Date/Time: 08/04/2023/11:51:13 AM    Final          ______________________________________________________________________________________________      EKG: none today  Physical Exam: BP (!) 160/74 (BP Location: Right Arm, Patient Position: Sitting,  Cuff Size: Normal)   Pulse 76   Ht 4' 10 (1.473 m)   Wt 121 lb 6.4 oz (55.1 kg)   LMP 08/11/2002   SpO2 97%   BMI 25.37 kg/m   GEN: Pleasant, well nourished, NAD NECK: No JVD CARDIAC: RRR, no murmurs, rubs, gallops RESPIRATORY:  Clear to auscultation  ABDOMEN: Soft, non-tender, non-distended EXTREMITIES:  No edema  Assessment & Plan: 1. Hypertension: BP 170/64 initially today, 160/74 on my recheck. Slightly higher in office than at home. Denies symptoms. Discussed options of increasing amlodipine , valsartan , or both. Patient agreeable to one at a time, prefers to start with valsartan . - Increase valsartan  to 320 mg daily - Check BMP 1-2 weeks - Continue amlodipine  2.5 mg daily - Continue hydrochlorothiazide  25 mg daily - Send BP log via MyChart in 1-2 weeks - consider increasing amlodipine  to 5 mg daily if BP not at goal  2. Hyperlipidemia: 06/03/24 LDL 50, 12/11/23 AST 23, ALT 14.  - Continue rosuvastatin  5 mg daily - Follows with Dr. Mona  3. Carotid artery disease: Right carotid endarterectomy 01/13/23 with Dr. Sheree. Acute CVA 08/03/23. Discharged on ASA x 20 days and Plavix , then Plavix  alone thereafter. Bilateral carotid duplex 11/03/25 stable. No symptoms. - Continue rosuvastatin  5 mg daily - Continue Plavix  75 mg daily - Follows with [facility] - appointment already scheduled for 11/09/24    Dispo: Follow-up in 2 months.   Signed, Saddie GORMAN Cleaves, NP 07/14/2024 11:24 AM [facility]

## 2024-07-14 ENCOUNTER — Encounter: Payer: Self-pay | Admitting: Physician Assistant

## 2024-07-14 ENCOUNTER — Ambulatory Visit: Attending: Physician Assistant

## 2024-07-14 VITALS — BP 160/74 | HR 76 | Ht <= 58 in | Wt 121.4 lb

## 2024-07-14 DIAGNOSIS — I6529 Occlusion and stenosis of unspecified carotid artery: Secondary | ICD-10-CM | POA: Diagnosis present

## 2024-07-14 DIAGNOSIS — I10 Essential (primary) hypertension: Secondary | ICD-10-CM | POA: Insufficient documentation

## 2024-07-14 DIAGNOSIS — E782 Mixed hyperlipidemia: Secondary | ICD-10-CM | POA: Diagnosis present

## 2024-07-14 DIAGNOSIS — Z79899 Other long term (current) drug therapy: Secondary | ICD-10-CM | POA: Diagnosis present

## 2024-07-14 MED ORDER — VALSARTAN 320 MG PO TABS: 320.0000 mg | ORAL_TABLET | Freq: Every day | ORAL | 3 refills | Status: AC

## 2024-07-14 NOTE — Patient Instructions (Signed)
 Medication Instructions:  INCREASE VALSARTAN  TO 320 MG DAILY *If you need a refill on your cardiac medications before your next appointment, please call your pharmacy*  Lab Work: BMP IN 2 WEEKS If you have labs (blood work) drawn today and your tests are completely normal, you will receive your results only by: MyChart Message (if you have MyChart) OR A paper copy in the mail If you have any lab test that is abnormal or we need to change your treatment, we will call you to review the results.  Testing/Procedures: NO TESTING  Follow-Up: At [facility], you and your health needs are our priority.  As part of our continuing mission to provide you with exceptional heart care, our providers are all part of one team.  This team includes your primary Cardiologist (physician) and Advanced Practice Providers or APPs (Physician Assistants and Nurse Practitioners) who all work together to provide you with the care you need, when you need it.  Your next appointment:   2 month(s)  Provider:   Vinie JAYSON Maxcy, MD  OR ANY APP   We recommend signing up for the patient portal called MyChart.  Sign up information is provided on this After Visit Summary.  MyChart is used to connect with patients for Virtual Visits (Telemedicine).  Patients are able to view lab/test results, encounter notes, upcoming appointments, etc.  Non-urgent messages can be sent to your provider as well.   To learn more about what you can do with MyChart, go to [email or web address].   Other Instructions CHECK BLOOD PRESSURE DAILY FOR 2 WEEKS, KEEP LOG OF READINGS AND SEND VIA MYCHART.

## 2024-07-28 ENCOUNTER — Encounter: Payer: Self-pay | Admitting: Internal Medicine

## 2024-07-28 LAB — BASIC METABOLIC PANEL WITH GFR
BUN/Creatinine Ratio: 26 (ref 12–28)
BUN: 28 mg/dL — ABNORMAL HIGH (ref 8–27)
CO2: 22 mmol/L (ref 20–29)
Calcium: 10.2 mg/dL (ref 8.7–10.3)
Chloride: 94 mmol/L — ABNORMAL LOW (ref 96–106)
Creatinine, Ser: 1.07 mg/dL — ABNORMAL HIGH (ref 0.57–1.00)
Glucose: 92 mg/dL (ref 70–99)
Potassium: 4.7 mmol/L (ref 3.5–5.2)
Sodium: 132 mmol/L — ABNORMAL LOW (ref 134–144)
eGFR: 51 mL/min/1.73 — ABNORMAL LOW (ref >59)

## 2024-07-29 ENCOUNTER — Ambulatory Visit: Payer: Self-pay

## 2024-07-29 MED ORDER — AMLODIPINE BESYLATE 2.5 MG PO TABS: 5.0000 mg | ORAL_TABLET | Freq: Every day | ORAL | 3 refills | Status: AC

## 2024-09-14 ENCOUNTER — Ambulatory Visit: Admitting: Emergency Medicine

## 2024-10-21 ENCOUNTER — Ambulatory Visit: Admitting: Emergency Medicine

## 2024-11-09 ENCOUNTER — Ambulatory Visit

## 2024-11-09 ENCOUNTER — Encounter (HOSPITAL_COMMUNITY)
